# Patient Record
Sex: Male | Born: 1935 | Race: White | Hispanic: No | Marital: Married | Smoking: Never smoker
Health system: Southern US, Community
[De-identification: ages and names within clinical notes are randomized; demographics above are authoritative.]

## PROBLEM LIST (undated history)

## (undated) DIAGNOSIS — C3490 Malignant neoplasm of unspecified part of unspecified bronchus or lung: Secondary | ICD-10-CM

## (undated) DIAGNOSIS — S72142A Displaced intertrochanteric fracture of left femur, initial encounter for closed fracture: Secondary | ICD-10-CM

## (undated) DIAGNOSIS — I10 Essential (primary) hypertension: Secondary | ICD-10-CM

## (undated) DIAGNOSIS — J449 Chronic obstructive pulmonary disease, unspecified: Secondary | ICD-10-CM

## (undated) DIAGNOSIS — E785 Hyperlipidemia, unspecified: Secondary | ICD-10-CM

## (undated) DIAGNOSIS — C801 Malignant (primary) neoplasm, unspecified: Secondary | ICD-10-CM

## (undated) DIAGNOSIS — S4290XA Fracture of unspecified shoulder girdle, part unspecified, initial encounter for closed fracture: Secondary | ICD-10-CM

## (undated) DIAGNOSIS — T7840XA Allergy, unspecified, initial encounter: Secondary | ICD-10-CM

## (undated) HISTORY — PX: HERNIA REPAIR: SHX51

## (undated) HISTORY — DX: Fracture of unspecified shoulder girdle, part unspecified, initial encounter for closed fracture: S42.90XA

## (undated) HISTORY — DX: Essential (primary) hypertension: I10

## (undated) HISTORY — DX: Allergy, unspecified, initial encounter: T78.40XA

## (undated) HISTORY — PX: COLOSTOMY REVISION: SHX5232

## (undated) HISTORY — DX: Hyperlipidemia, unspecified: E78.5

## (undated) HISTORY — PX: COLOSTOMY TAKEDOWN: SHX5258

## (undated) HISTORY — PX: COLON SURGERY: SHX602

---

## 1998-07-30 ENCOUNTER — Ambulatory Visit (HOSPITAL_BASED_OUTPATIENT_CLINIC_OR_DEPARTMENT_OTHER): Admission: RE | Admit: 1998-07-30 | Discharge: 1998-07-30 | Payer: Self-pay | Admitting: *Deleted

## 2000-11-17 ENCOUNTER — Ambulatory Visit (HOSPITAL_COMMUNITY): Admission: RE | Admit: 2000-11-17 | Discharge: 2000-11-17 | Payer: Self-pay | Admitting: Gastroenterology

## 2000-11-17 ENCOUNTER — Encounter (INDEPENDENT_AMBULATORY_CARE_PROVIDER_SITE_OTHER): Payer: Self-pay | Admitting: Specialist

## 2002-11-24 ENCOUNTER — Encounter: Payer: Self-pay | Admitting: Emergency Medicine

## 2002-11-24 ENCOUNTER — Emergency Department (HOSPITAL_COMMUNITY): Admission: EM | Admit: 2002-11-24 | Discharge: 2002-11-25 | Payer: Self-pay | Admitting: Emergency Medicine

## 2002-11-27 ENCOUNTER — Encounter: Admission: RE | Admit: 2002-11-27 | Discharge: 2002-11-27 | Payer: Self-pay | Admitting: Family Medicine

## 2002-11-27 ENCOUNTER — Encounter: Payer: Self-pay | Admitting: Family Medicine

## 2007-07-15 ENCOUNTER — Encounter: Admission: RE | Admit: 2007-07-15 | Discharge: 2007-07-15 | Payer: Self-pay | Admitting: Gastroenterology

## 2007-07-15 ENCOUNTER — Ambulatory Visit: Payer: Self-pay | Admitting: Internal Medicine

## 2007-07-15 ENCOUNTER — Ambulatory Visit: Payer: Self-pay | Admitting: Oncology

## 2007-07-15 ENCOUNTER — Inpatient Hospital Stay (HOSPITAL_COMMUNITY): Admission: EM | Admit: 2007-07-15 | Discharge: 2007-07-22 | Payer: Self-pay | Admitting: Emergency Medicine

## 2007-07-20 ENCOUNTER — Encounter (INDEPENDENT_AMBULATORY_CARE_PROVIDER_SITE_OTHER): Payer: Self-pay | Admitting: Interventional Radiology

## 2007-07-28 ENCOUNTER — Ambulatory Visit: Payer: Self-pay | Admitting: Oncology

## 2007-08-01 ENCOUNTER — Encounter: Admission: RE | Admit: 2007-08-01 | Discharge: 2007-08-01 | Payer: Self-pay | Admitting: Gastroenterology

## 2007-08-01 ENCOUNTER — Ambulatory Visit: Payer: Self-pay | Admitting: Internal Medicine

## 2007-08-01 ENCOUNTER — Inpatient Hospital Stay (HOSPITAL_COMMUNITY): Admission: AD | Admit: 2007-08-01 | Discharge: 2007-08-10 | Payer: Self-pay | Admitting: Gastroenterology

## 2007-08-02 ENCOUNTER — Encounter (INDEPENDENT_AMBULATORY_CARE_PROVIDER_SITE_OTHER): Payer: Self-pay | Admitting: Gastroenterology

## 2007-08-24 ENCOUNTER — Encounter: Payer: Self-pay | Admitting: Emergency Medicine

## 2007-08-24 LAB — COMPREHENSIVE METABOLIC PANEL
AST: 17 U/L (ref 0–37)
Albumin: 3.9 g/dL (ref 3.5–5.2)
Alkaline Phosphatase: 61 U/L (ref 39–117)
Glucose, Bld: 107 mg/dL — ABNORMAL HIGH (ref 70–99)
Potassium: 4.2 mEq/L (ref 3.5–5.3)
Sodium: 141 mEq/L (ref 135–145)
Total Bilirubin: 0.4 mg/dL (ref 0.3–1.2)
Total Protein: 7.1 g/dL (ref 6.0–8.3)

## 2007-08-24 LAB — CBC WITH DIFFERENTIAL/PLATELET
BASO%: 0.3 % (ref 0.0–2.0)
EOS%: 2.9 % (ref 0.0–7.0)
LYMPH%: 24.5 % (ref 14.0–48.0)
MCH: 30.3 pg (ref 28.0–33.4)
MCHC: 34.5 g/dL (ref 32.0–35.9)
MCV: 87.7 fL (ref 81.6–98.0)
MONO%: 6.9 % (ref 0.0–13.0)
NEUT#: 4.5 10*3/uL (ref 1.5–6.5)
Platelets: 283 10*3/uL (ref 145–400)
RBC: 4.57 10*6/uL (ref 4.20–5.71)
RDW: 13.4 % (ref 11.2–14.6)

## 2007-08-30 ENCOUNTER — Ambulatory Visit: Payer: Self-pay | Admitting: Pulmonary Disease

## 2007-08-30 DIAGNOSIS — J449 Chronic obstructive pulmonary disease, unspecified: Secondary | ICD-10-CM

## 2007-08-30 DIAGNOSIS — J4489 Other specified chronic obstructive pulmonary disease: Secondary | ICD-10-CM | POA: Insufficient documentation

## 2007-08-30 DIAGNOSIS — I10 Essential (primary) hypertension: Secondary | ICD-10-CM | POA: Insufficient documentation

## 2007-08-30 DIAGNOSIS — E785 Hyperlipidemia, unspecified: Secondary | ICD-10-CM

## 2007-09-06 ENCOUNTER — Ambulatory Visit (HOSPITAL_COMMUNITY): Admission: RE | Admit: 2007-09-06 | Discharge: 2007-09-06 | Payer: Self-pay | Admitting: Oncology

## 2007-09-15 ENCOUNTER — Ambulatory Visit: Payer: Self-pay | Admitting: Thoracic Surgery

## 2007-09-22 DIAGNOSIS — C3490 Malignant neoplasm of unspecified part of unspecified bronchus or lung: Secondary | ICD-10-CM

## 2007-09-26 ENCOUNTER — Encounter: Payer: Self-pay | Admitting: Emergency Medicine

## 2007-09-26 ENCOUNTER — Encounter: Payer: Self-pay | Admitting: Thoracic Surgery

## 2007-09-26 ENCOUNTER — Ambulatory Visit: Payer: Self-pay | Admitting: Thoracic Surgery

## 2007-09-26 ENCOUNTER — Ambulatory Visit (HOSPITAL_COMMUNITY): Admission: RE | Admit: 2007-09-26 | Discharge: 2007-09-26 | Payer: Self-pay | Admitting: Thoracic Surgery

## 2007-09-28 ENCOUNTER — Encounter: Admission: RE | Admit: 2007-09-28 | Discharge: 2007-09-28 | Payer: Self-pay | Admitting: Thoracic Surgery

## 2007-09-28 ENCOUNTER — Ambulatory Visit: Payer: Self-pay | Admitting: Thoracic Surgery

## 2007-09-30 ENCOUNTER — Ambulatory Visit: Payer: Self-pay | Admitting: Oncology

## 2007-10-10 ENCOUNTER — Inpatient Hospital Stay (HOSPITAL_COMMUNITY): Admission: RE | Admit: 2007-10-10 | Discharge: 2007-10-15 | Payer: Self-pay | Admitting: Thoracic Surgery

## 2007-10-10 ENCOUNTER — Ambulatory Visit: Payer: Self-pay | Admitting: Internal Medicine

## 2007-10-10 ENCOUNTER — Encounter: Payer: Self-pay | Admitting: Thoracic Surgery

## 2007-10-18 ENCOUNTER — Ambulatory Visit: Payer: Self-pay | Admitting: Internal Medicine

## 2007-10-20 ENCOUNTER — Ambulatory Visit: Payer: Self-pay | Admitting: Thoracic Surgery

## 2007-10-20 ENCOUNTER — Encounter: Admission: RE | Admit: 2007-10-20 | Discharge: 2007-10-20 | Payer: Self-pay | Admitting: Thoracic Surgery

## 2007-11-09 ENCOUNTER — Ambulatory Visit: Payer: Self-pay | Admitting: Thoracic Surgery

## 2007-11-09 ENCOUNTER — Encounter: Admission: RE | Admit: 2007-11-09 | Discharge: 2007-11-09 | Payer: Self-pay | Admitting: Thoracic Surgery

## 2007-11-09 LAB — CBC WITH DIFFERENTIAL/PLATELET
Eosinophils Absolute: 0.5 10*3/uL (ref 0.0–0.5)
HCT: 40 % (ref 38.7–49.9)
LYMPH%: 20.2 % (ref 14.0–48.0)
MONO#: 0.6 10*3/uL (ref 0.1–0.9)
NEUT#: 4.7 10*3/uL (ref 1.5–6.5)
NEUT%: 64.1 % (ref 40.0–75.0)
Platelets: 257 10*3/uL (ref 145–400)
WBC: 7.3 10*3/uL (ref 4.0–10.0)

## 2007-11-09 LAB — COMPREHENSIVE METABOLIC PANEL
CO2: 28 mEq/L (ref 19–32)
Calcium: 9.2 mg/dL (ref 8.4–10.5)
Creatinine, Ser: 0.84 mg/dL (ref 0.40–1.50)
Glucose, Bld: 99 mg/dL (ref 70–99)
Sodium: 139 mEq/L (ref 135–145)
Total Bilirubin: 0.3 mg/dL (ref 0.3–1.2)
Total Protein: 6.7 g/dL (ref 6.0–8.3)

## 2007-11-22 LAB — CBC WITH DIFFERENTIAL/PLATELET
Eosinophils Absolute: 0 10*3/uL (ref 0.0–0.5)
LYMPH%: 5.2 % — ABNORMAL LOW (ref 14.0–48.0)
MONO#: 0.3 10*3/uL (ref 0.1–0.9)
NEUT#: 10.4 10*3/uL — ABNORMAL HIGH (ref 1.5–6.5)
Platelets: 276 10*3/uL (ref 145–400)
RBC: 4.6 10*6/uL (ref 4.20–5.71)
WBC: 11.3 10*3/uL — ABNORMAL HIGH (ref 4.0–10.0)
lymph#: 0.6 10*3/uL — ABNORMAL LOW (ref 0.9–3.3)

## 2007-11-22 LAB — COMPREHENSIVE METABOLIC PANEL
Albumin: 4.1 g/dL (ref 3.5–5.2)
CO2: 23 mEq/L (ref 19–32)
Calcium: 9.7 mg/dL (ref 8.4–10.5)
Chloride: 102 mEq/L (ref 96–112)
Glucose, Bld: 172 mg/dL — ABNORMAL HIGH (ref 70–99)
Potassium: 4.2 mEq/L (ref 3.5–5.3)
Sodium: 139 mEq/L (ref 135–145)
Total Bilirubin: 0.3 mg/dL (ref 0.3–1.2)
Total Protein: 7.1 g/dL (ref 6.0–8.3)

## 2007-11-25 ENCOUNTER — Ambulatory Visit: Payer: Self-pay | Admitting: Internal Medicine

## 2007-11-29 LAB — CBC WITH DIFFERENTIAL/PLATELET
BASO%: 0.3 % (ref 0.0–2.0)
Eosinophils Absolute: 0.1 10*3/uL (ref 0.0–0.5)
MCHC: 35 g/dL (ref 32.0–35.9)
MONO#: 0.6 10*3/uL (ref 0.1–0.9)
NEUT#: 1.2 10*3/uL — ABNORMAL LOW (ref 1.5–6.5)
RBC: 4.67 10*6/uL (ref 4.20–5.71)
RDW: 13.8 % (ref 11.2–14.6)
WBC: 3.2 10*3/uL — ABNORMAL LOW (ref 4.0–10.0)

## 2007-11-29 LAB — COMPREHENSIVE METABOLIC PANEL
ALT: 18 U/L (ref 0–53)
Albumin: 4 g/dL (ref 3.5–5.2)
Alkaline Phosphatase: 66 U/L (ref 39–117)
CO2: 29 mEq/L (ref 19–32)
Glucose, Bld: 99 mg/dL (ref 70–99)
Potassium: 4.6 mEq/L (ref 3.5–5.3)
Sodium: 137 mEq/L (ref 135–145)
Total Protein: 6.7 g/dL (ref 6.0–8.3)

## 2007-12-08 LAB — CBC WITH DIFFERENTIAL/PLATELET
BASO%: 0.3 % (ref 0.0–2.0)
Eosinophils Absolute: 0 10*3/uL (ref 0.0–0.5)
HCT: 38.3 % — ABNORMAL LOW (ref 38.7–49.9)
MCHC: 34.5 g/dL (ref 32.0–35.9)
MONO#: 0.4 10*3/uL (ref 0.1–0.9)
NEUT#: 5 10*3/uL (ref 1.5–6.5)
NEUT%: 72.9 % (ref 40.0–75.0)
RBC: 4.4 10*6/uL (ref 4.20–5.71)
WBC: 6.9 10*3/uL (ref 4.0–10.0)
lymph#: 1.5 10*3/uL (ref 0.9–3.3)

## 2007-12-08 LAB — COMPREHENSIVE METABOLIC PANEL
ALT: 17 U/L (ref 0–53)
CO2: 32 mEq/L (ref 19–32)
Calcium: 9.3 mg/dL (ref 8.4–10.5)
Chloride: 100 mEq/L (ref 96–112)
Glucose, Bld: 81 mg/dL (ref 70–99)
Sodium: 140 mEq/L (ref 135–145)
Total Protein: 6.3 g/dL (ref 6.0–8.3)

## 2007-12-12 LAB — COMPREHENSIVE METABOLIC PANEL
ALT: 12 U/L (ref 0–53)
CO2: 28 mEq/L (ref 19–32)
Chloride: 100 mEq/L (ref 96–112)
Potassium: 4.4 mEq/L (ref 3.5–5.3)
Sodium: 137 mEq/L (ref 135–145)
Total Bilirubin: 0.3 mg/dL (ref 0.3–1.2)
Total Protein: 6.4 g/dL (ref 6.0–8.3)

## 2007-12-12 LAB — CBC WITH DIFFERENTIAL/PLATELET
BASO%: 0.1 % (ref 0.0–2.0)
LYMPH%: 5.7 % — ABNORMAL LOW (ref 14.0–48.0)
MCHC: 34.7 g/dL (ref 32.0–35.9)
MONO#: 0.1 10*3/uL (ref 0.1–0.9)
RBC: 4.64 10*6/uL (ref 4.20–5.71)
RDW: 13.2 % (ref 11.2–14.6)
WBC: 14.6 10*3/uL — ABNORMAL HIGH (ref 4.0–10.0)
lymph#: 0.8 10*3/uL — ABNORMAL LOW (ref 0.9–3.3)

## 2007-12-14 ENCOUNTER — Ambulatory Visit: Payer: Self-pay | Admitting: Thoracic Surgery

## 2007-12-14 ENCOUNTER — Encounter: Admission: RE | Admit: 2007-12-14 | Discharge: 2007-12-14 | Payer: Self-pay | Admitting: Thoracic Surgery

## 2007-12-20 LAB — COMPREHENSIVE METABOLIC PANEL
ALT: 18 U/L (ref 0–53)
AST: 16 U/L (ref 0–37)
Alkaline Phosphatase: 64 U/L (ref 39–117)
CO2: 31 mEq/L (ref 19–32)
Sodium: 138 mEq/L (ref 135–145)
Total Bilirubin: 0.4 mg/dL (ref 0.3–1.2)
Total Protein: 6.1 g/dL (ref 6.0–8.3)

## 2007-12-20 LAB — CBC WITH DIFFERENTIAL/PLATELET
BASO%: 0.8 % (ref 0.0–2.0)
LYMPH%: 25.1 % (ref 14.0–48.0)
MCHC: 35.3 g/dL (ref 32.0–35.9)
MONO#: 0.7 10*3/uL (ref 0.1–0.9)
MONO%: 17 % — ABNORMAL HIGH (ref 0.0–13.0)
Platelets: 163 10*3/uL (ref 145–400)
RBC: 4.32 10*6/uL (ref 4.20–5.71)
WBC: 4.2 10*3/uL (ref 4.0–10.0)

## 2007-12-27 LAB — CBC WITH DIFFERENTIAL/PLATELET
BASO%: 0.2 % (ref 0.0–2.0)
LYMPH%: 14.2 % (ref 14.0–48.0)
MCH: 30.2 pg (ref 28.0–33.4)
MCHC: 34.8 g/dL (ref 32.0–35.9)
MCV: 86.8 fL (ref 81.6–98.0)
MONO%: 4.5 % (ref 0.0–13.0)
Platelets: 103 10*3/uL — ABNORMAL LOW (ref 145–400)
RBC: 4.39 10*6/uL (ref 4.20–5.71)

## 2007-12-27 LAB — COMPREHENSIVE METABOLIC PANEL
ALT: 19 U/L (ref 0–53)
Alkaline Phosphatase: 69 U/L (ref 39–117)
Glucose, Bld: 105 mg/dL — ABNORMAL HIGH (ref 70–99)
Sodium: 141 mEq/L (ref 135–145)
Total Bilirubin: 0.4 mg/dL (ref 0.3–1.2)
Total Protein: 6.4 g/dL (ref 6.0–8.3)

## 2008-01-02 LAB — COMPREHENSIVE METABOLIC PANEL
Alkaline Phosphatase: 53 U/L (ref 39–117)
BUN: 28 mg/dL — ABNORMAL HIGH (ref 6–23)
Creatinine, Ser: 0.93 mg/dL (ref 0.40–1.50)
Glucose, Bld: 148 mg/dL — ABNORMAL HIGH (ref 70–99)
Sodium: 142 mEq/L (ref 135–145)
Total Bilirubin: 0.4 mg/dL (ref 0.3–1.2)
Total Protein: 6.1 g/dL (ref 6.0–8.3)

## 2008-01-02 LAB — CBC WITH DIFFERENTIAL/PLATELET
Eosinophils Absolute: 0 10*3/uL (ref 0.0–0.5)
HCT: 33 % — ABNORMAL LOW (ref 38.7–49.9)
LYMPH%: 4.7 % — ABNORMAL LOW (ref 14.0–48.0)
MCV: 86.7 fL (ref 81.6–98.0)
MONO%: 2.5 % (ref 0.0–13.0)
NEUT#: 11.6 10*3/uL — ABNORMAL HIGH (ref 1.5–6.5)
NEUT%: 92.8 % — ABNORMAL HIGH (ref 40.0–75.0)
Platelets: 168 10*3/uL (ref 145–400)
RBC: 3.81 10*6/uL — ABNORMAL LOW (ref 4.20–5.71)

## 2008-01-10 ENCOUNTER — Ambulatory Visit: Payer: Self-pay | Admitting: Internal Medicine

## 2008-01-10 LAB — CBC WITH DIFFERENTIAL/PLATELET
BASO%: 0.5 % (ref 0.0–2.0)
Basophils Absolute: 0 10*3/uL (ref 0.0–0.1)
EOS%: 0.3 % (ref 0.0–7.0)
HCT: 31.1 % — ABNORMAL LOW (ref 38.7–49.9)
HGB: 10.9 g/dL — ABNORMAL LOW (ref 13.0–17.1)
LYMPH%: 22 % (ref 14.0–48.0)
MCH: 30.6 pg (ref 28.0–33.4)
MCHC: 35.2 g/dL (ref 32.0–35.9)
MCV: 87.1 fL (ref 81.6–98.0)
NEUT%: 55.7 % (ref 40.0–75.0)
Platelets: 148 10*3/uL (ref 145–400)

## 2008-01-10 LAB — COMPREHENSIVE METABOLIC PANEL
ALT: 13 U/L (ref 0–53)
AST: 17 U/L (ref 0–37)
BUN: 34 mg/dL — ABNORMAL HIGH (ref 6–23)
Creatinine, Ser: 0.95 mg/dL (ref 0.40–1.50)
Total Bilirubin: 0.5 mg/dL (ref 0.3–1.2)

## 2008-01-17 LAB — COMPREHENSIVE METABOLIC PANEL
ALT: 10 U/L (ref 0–53)
BUN: 16 mg/dL (ref 6–23)
CO2: 28 mEq/L (ref 19–32)
Calcium: 8.7 mg/dL (ref 8.4–10.5)
Chloride: 105 mEq/L (ref 96–112)
Creatinine, Ser: 0.88 mg/dL (ref 0.40–1.50)
Glucose, Bld: 106 mg/dL — ABNORMAL HIGH (ref 70–99)
Total Bilirubin: 0.4 mg/dL (ref 0.3–1.2)

## 2008-01-17 LAB — CBC WITH DIFFERENTIAL/PLATELET
BASO%: 0.3 % (ref 0.0–2.0)
Basophils Absolute: 0 10*3/uL (ref 0.0–0.1)
HCT: 29.7 % — ABNORMAL LOW (ref 38.7–49.9)
HGB: 10.5 g/dL — ABNORMAL LOW (ref 13.0–17.1)
LYMPH%: 14.8 % (ref 14.0–48.0)
MCH: 31.1 pg (ref 28.0–33.4)
MCHC: 35.3 g/dL (ref 32.0–35.9)
MONO#: 0.3 10*3/uL (ref 0.1–0.9)
NEUT%: 80.6 % — ABNORMAL HIGH (ref 40.0–75.0)
Platelets: 84 10*3/uL — ABNORMAL LOW (ref 145–400)
WBC: 6.5 10*3/uL (ref 4.0–10.0)
lymph#: 1 10*3/uL (ref 0.9–3.3)

## 2008-01-19 ENCOUNTER — Ambulatory Visit (HOSPITAL_COMMUNITY): Admission: RE | Admit: 2008-01-19 | Discharge: 2008-01-19 | Payer: Self-pay | Admitting: Internal Medicine

## 2008-01-23 LAB — COMPREHENSIVE METABOLIC PANEL
ALT: 8 U/L (ref 0–53)
CO2: 26 mEq/L (ref 19–32)
Calcium: 9.2 mg/dL (ref 8.4–10.5)
Chloride: 103 mEq/L (ref 96–112)
Creatinine, Ser: 0.81 mg/dL (ref 0.40–1.50)
Glucose, Bld: 209 mg/dL — ABNORMAL HIGH (ref 70–99)
Total Bilirubin: 0.4 mg/dL (ref 0.3–1.2)

## 2008-01-23 LAB — CBC WITH DIFFERENTIAL/PLATELET
BASO%: 0 % (ref 0.0–2.0)
Basophils Absolute: 0 10*3/uL (ref 0.0–0.1)
Eosinophils Absolute: 0 10*3/uL (ref 0.0–0.5)
HCT: 31.7 % — ABNORMAL LOW (ref 38.7–49.9)
HGB: 10.9 g/dL — ABNORMAL LOW (ref 13.0–17.1)
LYMPH%: 5.4 % — ABNORMAL LOW (ref 14.0–48.0)
MCHC: 34.5 g/dL (ref 32.0–35.9)
MONO#: 0.1 10*3/uL (ref 0.1–0.9)
NEUT#: 7.6 10*3/uL — ABNORMAL HIGH (ref 1.5–6.5)
NEUT%: 93.7 % — ABNORMAL HIGH (ref 40.0–75.0)
Platelets: 241 10*3/uL (ref 145–400)
WBC: 8.1 10*3/uL (ref 4.0–10.0)
lymph#: 0.4 10*3/uL — ABNORMAL LOW (ref 0.9–3.3)

## 2008-01-31 ENCOUNTER — Encounter: Admission: RE | Admit: 2008-01-31 | Discharge: 2008-01-31 | Payer: Self-pay | Admitting: Thoracic Surgery

## 2008-01-31 ENCOUNTER — Ambulatory Visit: Payer: Self-pay | Admitting: Thoracic Surgery

## 2008-02-08 LAB — COMPREHENSIVE METABOLIC PANEL
ALT: 9 U/L (ref 0–53)
AST: 16 U/L (ref 0–37)
Calcium: 8.5 mg/dL (ref 8.4–10.5)
Chloride: 102 mEq/L (ref 96–112)
Creatinine, Ser: 0.71 mg/dL (ref 0.40–1.50)
Potassium: 3.8 mEq/L (ref 3.5–5.3)

## 2008-02-08 LAB — CBC WITH DIFFERENTIAL/PLATELET
BASO%: 0.3 % (ref 0.0–2.0)
EOS%: 0 % (ref 0.0–7.0)
MCH: 32.4 pg (ref 28.0–33.4)
MCHC: 34.9 g/dL (ref 32.0–35.9)
RDW: 21.4 % — ABNORMAL HIGH (ref 11.2–14.6)
lymph#: 0.8 10*3/uL — ABNORMAL LOW (ref 0.9–3.3)

## 2008-02-15 LAB — CBC WITH DIFFERENTIAL/PLATELET
BASO%: 0.6 % (ref 0.0–2.0)
EOS%: 0.1 % (ref 0.0–7.0)
HCT: 28.3 % — ABNORMAL LOW (ref 38.7–49.9)
LYMPH%: 20 % (ref 14.0–48.0)
MCH: 32.6 pg (ref 28.0–33.4)
MCHC: 34.4 g/dL (ref 32.0–35.9)
MCV: 94.8 fL (ref 81.6–98.0)
MONO%: 10.4 % (ref 0.0–13.0)
NEUT%: 68.9 % (ref 40.0–75.0)
lymph#: 1 10*3/uL (ref 0.9–3.3)

## 2008-02-15 LAB — COMPREHENSIVE METABOLIC PANEL
ALT: 10 U/L (ref 0–53)
AST: 16 U/L (ref 0–37)
Alkaline Phosphatase: 60 U/L (ref 39–117)
Chloride: 103 mEq/L (ref 96–112)
Creatinine, Ser: 0.72 mg/dL (ref 0.40–1.50)
Total Bilirubin: 0.4 mg/dL (ref 0.3–1.2)

## 2008-04-13 IMAGING — PT NM PET TUM IMG SKULL BASE T - THIGH
1 of 5 series · 1 of 25 positions shown · non-contrast
Comparison: None.

FDG PET-CT TUMOR IMAGING (SKULL BASE TO THIGHS):

CLINICAL DATA: Left lower lobe pulmonary carcinoma
TECHNIQUE: 18.4 mCi F-18 FDG was injected intravenously via the right
antecubital fossa .  Full-ring PET imaging was performed from the skull base
through the mid-thighs 60 minutes after injection.  CT data was obtained and
used for attenuation correction and anatomic localization only.  (This was not
acquired as a diagnostic CT examination.)

[Series 1: pet ac · axial · 3.3mm · 4.69mm/px · 1 of 267 slices shown]
[im 160/267]
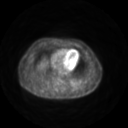

[1 of 25 positions shown; findings below may reference images not displayed]

FINDINGS: The pulmonary lesion in the superior segment of the left lower lobe is
hypermetabolic with SUV max of 14.7. The patient has hypermetabolic activity in
the precarinal station, subcarinal sedation, and both hila. Small lymph nodes
are seen in these locations on the diagnostic CT chest from 08/10/2007.

There is some low attenuation and thickening in the adrenal glands bilaterally.
The left adrenal gland averages about 2 Hounsfield units on the CT scan. There
is no evidence for FDG accumulation within either adrenal gland above background
soft tissue levels. Given the lack of FDG uptake, the low attenuation, and the
diffuse thickening without a discrete nodule, the changes in the adrenal glands
are felt to be most likely related to hyperplasia.

No unexpected areas of hypermetabolic activity are seen in the neck, abdomen, or
pelvis.

The heart is enlarged with coronary artery and mitral annular calcification.
Atherosclerotic calcification is seen in the abdominal aorta without aneurysm. A
9 mm hyper attenuating lesion in the right kidney demonstrates no FDG
accumulation on the PET scan. This may be below the size threshold for pet
resolution. While likely a cyst complicated by proteinaceous debris or
hemorrhage, continued attention on followup imaging is recommended. A 2.6 cm
exophytic low density lesion in the left kidney is not hypermetabolic. Left
lower quadrant colostomy is noted.
IMPRESSION: Malignant range hypermetabolic uptake within the left lower lobe pulmonary
lesion. Hypermetabolic activity within mediastinal and bilateral hilar lymph
nodes ranges with SUV values from 5.3 up to 8.6.

## 2008-04-18 ENCOUNTER — Ambulatory Visit: Payer: Self-pay | Admitting: Internal Medicine

## 2008-04-18 ENCOUNTER — Ambulatory Visit (HOSPITAL_COMMUNITY): Admission: RE | Admit: 2008-04-18 | Discharge: 2008-04-18 | Payer: Self-pay | Admitting: Internal Medicine

## 2008-05-07 ENCOUNTER — Encounter: Admission: RE | Admit: 2008-05-07 | Discharge: 2008-05-07 | Payer: Self-pay | Admitting: General Surgery

## 2008-05-08 ENCOUNTER — Ambulatory Visit: Payer: Self-pay | Admitting: Thoracic Surgery

## 2008-07-17 ENCOUNTER — Ambulatory Visit: Payer: Self-pay | Admitting: Internal Medicine

## 2008-07-17 ENCOUNTER — Encounter (INDEPENDENT_AMBULATORY_CARE_PROVIDER_SITE_OTHER): Payer: Self-pay | Admitting: General Surgery

## 2008-07-17 ENCOUNTER — Inpatient Hospital Stay (HOSPITAL_COMMUNITY): Admission: RE | Admit: 2008-07-17 | Discharge: 2008-07-22 | Payer: Self-pay | Admitting: General Surgery

## 2008-08-08 ENCOUNTER — Ambulatory Visit (HOSPITAL_COMMUNITY): Admission: RE | Admit: 2008-08-08 | Discharge: 2008-08-08 | Payer: Self-pay | Admitting: Internal Medicine

## 2008-08-08 LAB — CBC WITH DIFFERENTIAL/PLATELET
BASO%: 0.3 % (ref 0.0–2.0)
Basophils Absolute: 0 10*3/uL (ref 0.0–0.1)
EOS%: 5 % (ref 0.0–7.0)
HGB: 13.1 g/dL (ref 13.0–17.1)
MCH: 29 pg (ref 28.0–33.4)
MCHC: 33.5 g/dL (ref 32.0–35.9)
RDW: 16.1 % — ABNORMAL HIGH (ref 11.2–14.6)
WBC: 5.4 10*3/uL (ref 4.0–10.0)
lymph#: 1.6 10*3/uL (ref 0.9–3.3)

## 2008-08-08 LAB — COMPREHENSIVE METABOLIC PANEL
ALT: 12 U/L (ref 0–53)
AST: 15 U/L (ref 0–37)
Albumin: 3.7 g/dL (ref 3.5–5.2)
Calcium: 9.6 mg/dL (ref 8.4–10.5)
Chloride: 100 mEq/L (ref 96–112)
Potassium: 4.1 mEq/L (ref 3.5–5.3)

## 2008-08-14 ENCOUNTER — Ambulatory Visit: Payer: Self-pay | Admitting: Thoracic Surgery

## 2008-11-24 IMAGING — CT CT CHEST W/ CM
2 of 4 series · 15 of 36 positions shown, 18 images · IV contrast (agent unspecified)
Comparison: Chest CT 01/19/2008.

CLINICAL DATA: Lung cancer.  Chemotherapy completed.

CT CHEST WITH CONTRAST
TECHNIQUE: Multidetector CT imaging of the chest was performed
following the standard protocol during bolus administration of
intravenous contrast.
Contrast: 80 ml [id number].

[Series 2: chest routine 5.0 b40f · axial · 0.76mm/px · z∈[-224,+46]mm · 12 of 64 slices shown, 15 images]
[im 5/64  mediastinal]
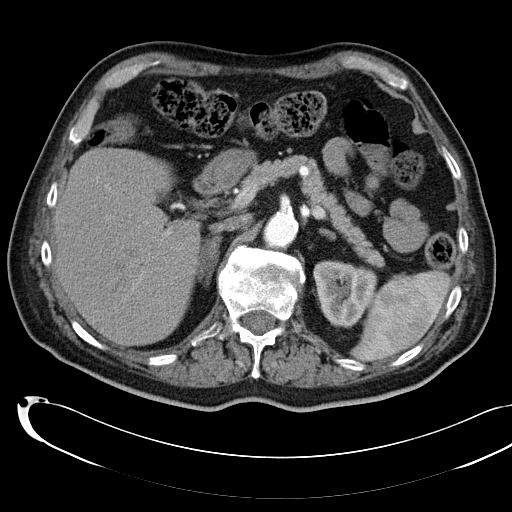
[im 5/64  lung]
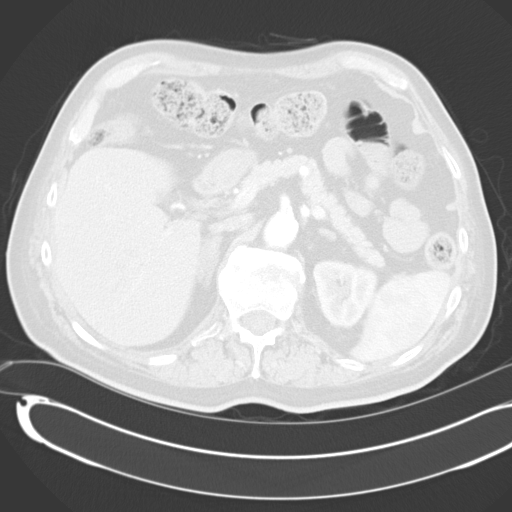
[im 10/64  lung]
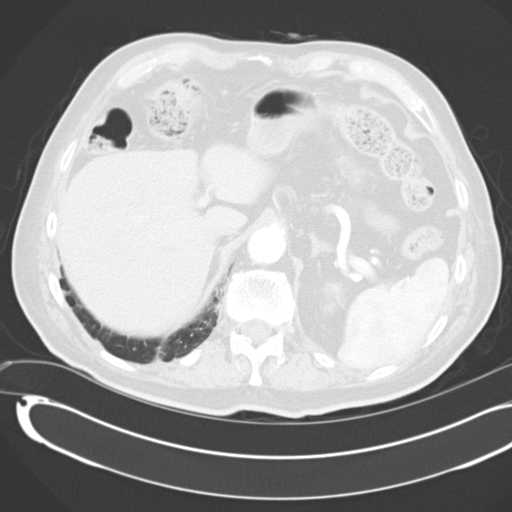
[im 15/64  lung]
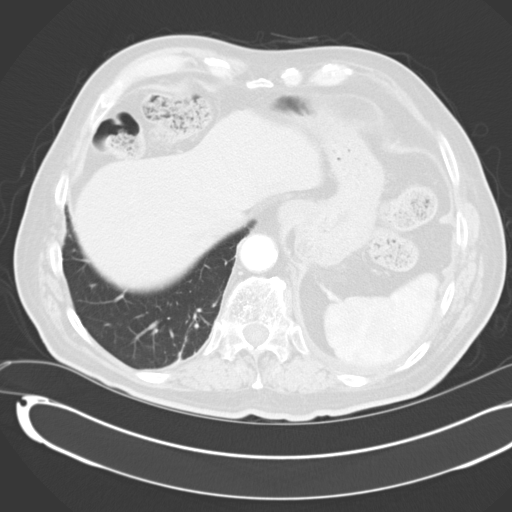
[im 20/64  lung]
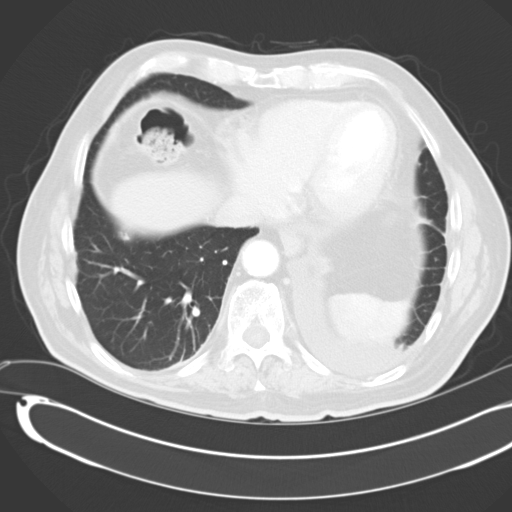
[im 25/64  mediastinal]
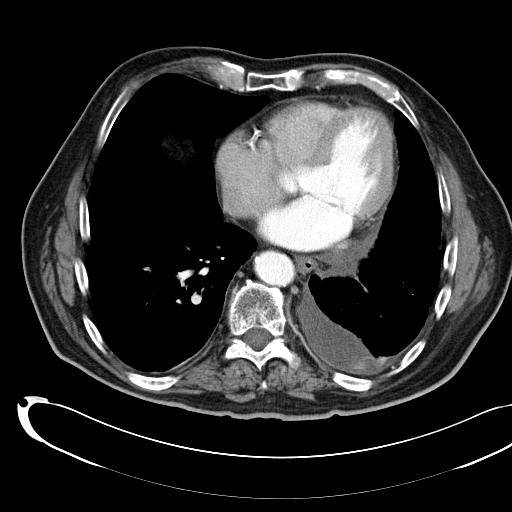
[im 25/64  lung]
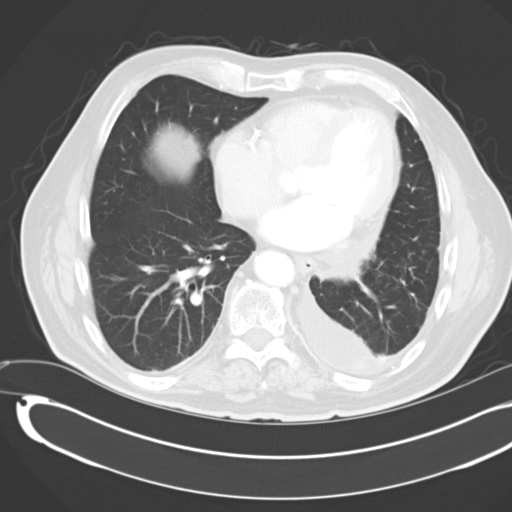
[im 30/64  lung]
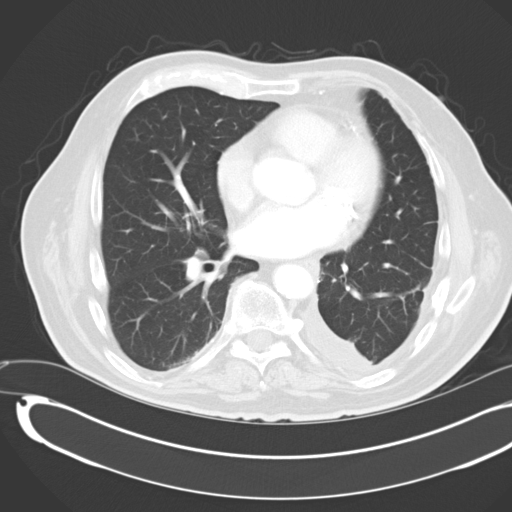
[im 34/64  lung]
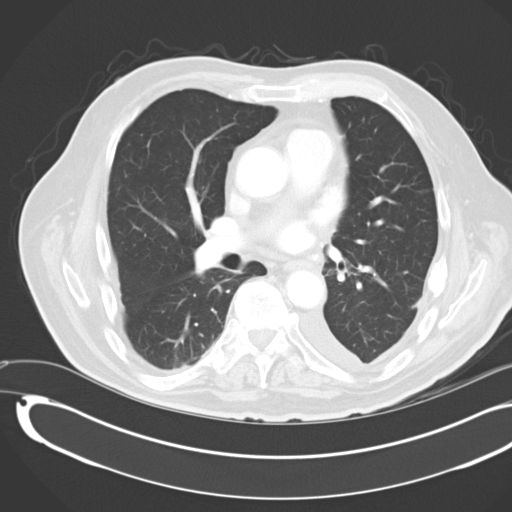
[im 39/64  lung]
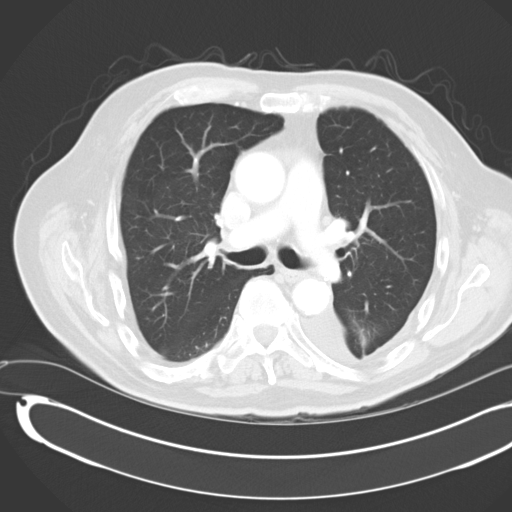
[im 44/64  mediastinal]
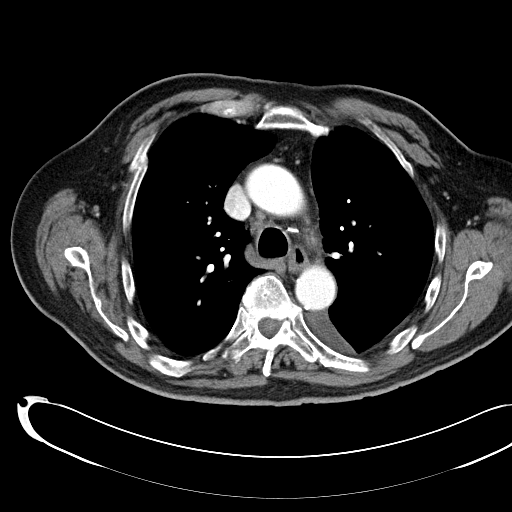
[im 44/64  lung]
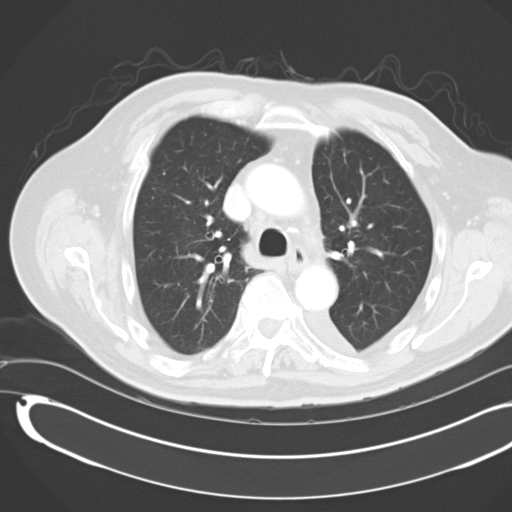
[im 49/64  lung]
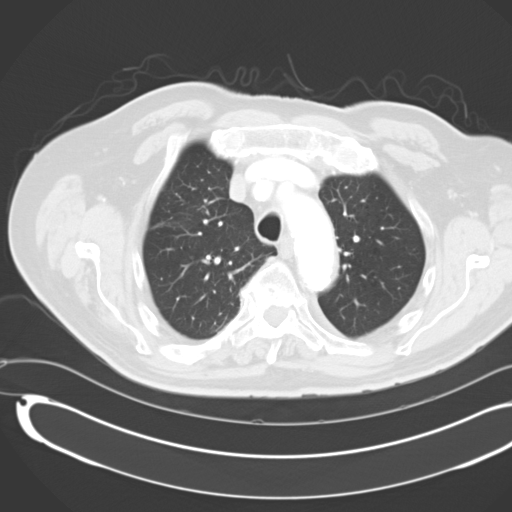
[im 54/64  lung]
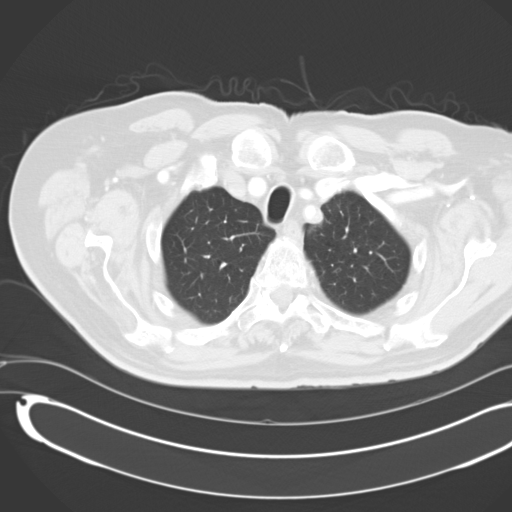
[im 59/64  lung]
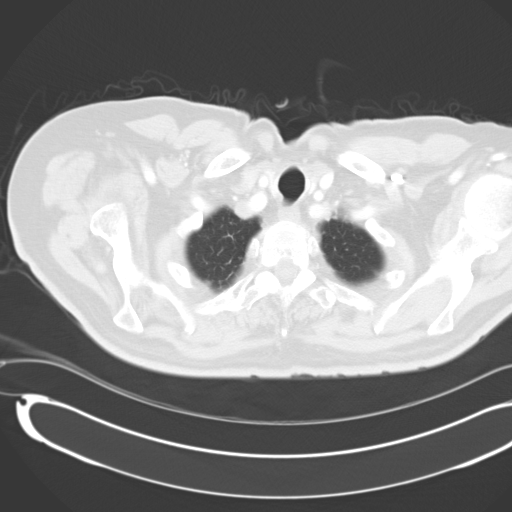

[Series 602: coronal images · coronal · 0.76mm/px · 3 of 94 slices shown]
[im 19/94  lung]
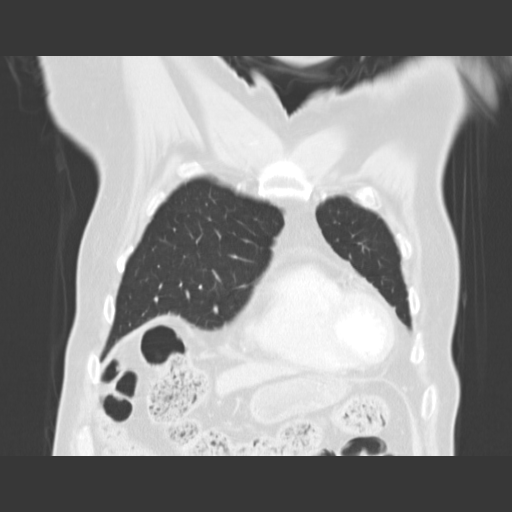
[im 38/94  lung]
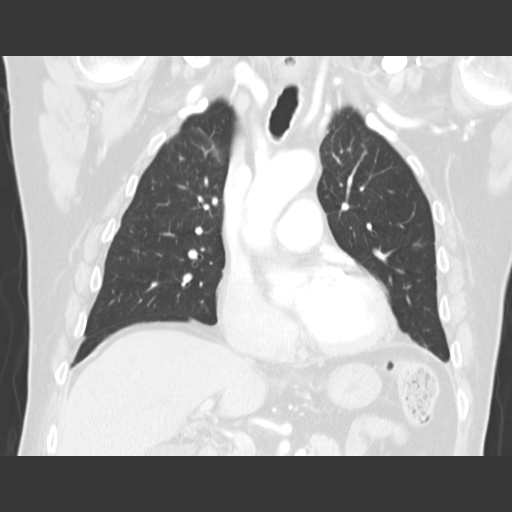
[im 56/94  lung]
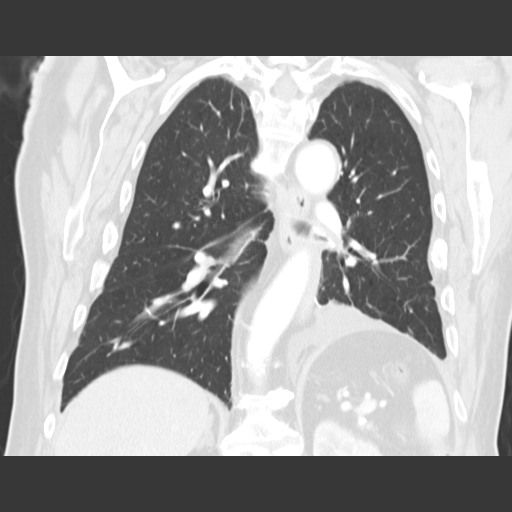

[15 of 36 positions shown; findings below may reference images not displayed]

FINDINGS: There are stable postsurgical changes status post left
thoracotomy and lower lobe resection.  Scattered mild pulmonary
scarring appears stable.  There are no suspicious pulmonary nodules
or endobronchial lesions.

The patient has developed a small dependent left pleural effusion
superimposed on pleural thickening.  Within the left pleural space,
there is increased density on images 39-42.  This appears linear on
the reformatted images and is probably related to visceral pleural
thickening.  No well-defined pleural-based mass is identified.
There is no right pleural effusion or pericardial effusion.

There are no enlarged mediastinal or hilar lymph nodes.  Coronary
artery disease and atherosclerosis at the origin of the left
subclavian artery are again noted.

The visualized upper abdomen has a stable appearance with
prominence of both adrenal glands and a stable low density lesion
in the left hepatic lobe on image 52.  A small left renal cyst is
partially imaged and appears stable.  There are no suspicious
osseous lesions.
IMPRESSION: 1.  Interval development of small left pleural effusion with
probable associated pleural thickening.  No well-defined pleural-
based mass is demonstrated.  Close CT follow-up or thoracentesis is
recommended to exclude recurrence in the pleural space.
2.  Otherwise stable postoperative CT of the chest with mild
parenchymal scarring.  No adenopathy or local recurrence
demonstrated.
3.  Stable atherosclerosis.
4.  Stable appearance of the visualized upper abdomen.

## 2009-01-29 ENCOUNTER — Ambulatory Visit: Payer: Self-pay | Admitting: Internal Medicine

## 2009-01-31 ENCOUNTER — Ambulatory Visit (HOSPITAL_COMMUNITY): Admission: RE | Admit: 2009-01-31 | Discharge: 2009-01-31 | Payer: Self-pay | Admitting: Internal Medicine

## 2009-01-31 LAB — CBC WITH DIFFERENTIAL/PLATELET
Basophils Absolute: 0 10*3/uL (ref 0.0–0.1)
EOS%: 1.4 % (ref 0.0–7.0)
Eosinophils Absolute: 0.1 10*3/uL (ref 0.0–0.5)
HCT: 41.2 % (ref 38.4–49.9)
HGB: 13.9 g/dL (ref 13.0–17.1)
MONO#: 0.4 10*3/uL (ref 0.1–0.9)
NEUT#: 3.2 10*3/uL (ref 1.5–6.5)
RDW: 14.9 % — ABNORMAL HIGH (ref 11.0–14.6)
WBC: 4.9 10*3/uL (ref 4.0–10.3)
lymph#: 1.2 10*3/uL (ref 0.9–3.3)

## 2009-01-31 LAB — COMPREHENSIVE METABOLIC PANEL
AST: 15 U/L (ref 0–37)
Albumin: 3.7 g/dL (ref 3.5–5.2)
BUN: 12 mg/dL (ref 6–23)
CO2: 31 mEq/L (ref 19–32)
Calcium: 9.2 mg/dL (ref 8.4–10.5)
Chloride: 101 mEq/L (ref 96–112)
Potassium: 4.2 mEq/L (ref 3.5–5.3)

## 2009-02-05 ENCOUNTER — Ambulatory Visit: Payer: Self-pay | Admitting: Thoracic Surgery

## 2009-06-04 ENCOUNTER — Encounter: Admission: RE | Admit: 2009-06-04 | Discharge: 2009-06-04 | Payer: Self-pay | Admitting: Family Medicine

## 2009-07-12 ENCOUNTER — Ambulatory Visit: Payer: Self-pay | Admitting: Internal Medicine

## 2009-07-16 ENCOUNTER — Ambulatory Visit (HOSPITAL_COMMUNITY): Admission: RE | Admit: 2009-07-16 | Discharge: 2009-07-16 | Payer: Self-pay | Admitting: Internal Medicine

## 2009-07-16 LAB — COMPREHENSIVE METABOLIC PANEL
ALT: 10 U/L (ref 0–53)
Albumin: 4.1 g/dL (ref 3.5–5.2)
CO2: 34 mEq/L — ABNORMAL HIGH (ref 19–32)
Glucose, Bld: 96 mg/dL (ref 70–99)
Potassium: 4.1 mEq/L (ref 3.5–5.3)
Sodium: 136 mEq/L (ref 135–145)
Total Protein: 6.7 g/dL (ref 6.0–8.3)

## 2009-07-16 LAB — CBC WITH DIFFERENTIAL/PLATELET
Eosinophils Absolute: 0.1 10*3/uL (ref 0.0–0.5)
MONO#: 0.5 10*3/uL (ref 0.1–0.9)
NEUT#: 4.1 10*3/uL (ref 1.5–6.5)
RBC: 4.81 10*6/uL (ref 4.20–5.82)
RDW: 13.5 % (ref 11.0–14.6)
WBC: 6.3 10*3/uL (ref 4.0–10.3)
lymph#: 1.6 10*3/uL (ref 0.9–3.3)

## 2009-08-14 ENCOUNTER — Ambulatory Visit: Payer: Self-pay | Admitting: Thoracic Surgery

## 2009-08-19 ENCOUNTER — Ambulatory Visit (HOSPITAL_COMMUNITY): Admission: RE | Admit: 2009-08-19 | Discharge: 2009-08-19 | Payer: Self-pay | Admitting: Thoracic Surgery

## 2009-08-19 ENCOUNTER — Ambulatory Visit: Payer: Self-pay | Admitting: Thoracic Surgery

## 2009-08-21 ENCOUNTER — Ambulatory Visit: Payer: Self-pay | Admitting: Thoracic Surgery

## 2010-01-21 ENCOUNTER — Ambulatory Visit: Payer: Self-pay | Admitting: Internal Medicine

## 2010-01-22 LAB — CBC WITH DIFFERENTIAL/PLATELET
BASO%: 0.2 % (ref 0.0–2.0)
HCT: 41.1 % (ref 38.4–49.9)
MCHC: 34.4 g/dL (ref 32.0–36.0)
MONO#: 0.4 10*3/uL (ref 0.1–0.9)
NEUT#: 4.4 10*3/uL (ref 1.5–6.5)
RBC: 4.63 10*6/uL (ref 4.20–5.82)
WBC: 6.4 10*3/uL (ref 4.0–10.3)
lymph#: 1.5 10*3/uL (ref 0.9–3.3)

## 2010-01-22 LAB — COMPREHENSIVE METABOLIC PANEL
ALT: 11 U/L (ref 0–53)
Albumin: 3.9 g/dL (ref 3.5–5.2)
CO2: 33 mEq/L — ABNORMAL HIGH (ref 19–32)
Calcium: 9 mg/dL (ref 8.4–10.5)
Chloride: 100 mEq/L (ref 96–112)
Sodium: 140 mEq/L (ref 135–145)
Total Protein: 6.7 g/dL (ref 6.0–8.3)

## 2010-01-23 ENCOUNTER — Ambulatory Visit (HOSPITAL_COMMUNITY): Admission: RE | Admit: 2010-01-23 | Discharge: 2010-01-23 | Payer: Self-pay | Admitting: Internal Medicine

## 2010-05-14 ENCOUNTER — Encounter: Admission: RE | Admit: 2010-05-14 | Discharge: 2010-05-14 | Payer: Self-pay | Admitting: Family Medicine

## 2010-07-23 ENCOUNTER — Ambulatory Visit: Payer: Self-pay | Admitting: Internal Medicine

## 2010-07-25 LAB — CBC WITH DIFFERENTIAL/PLATELET
Basophils Absolute: 0 10*3/uL (ref 0.0–0.1)
EOS%: 1.7 % (ref 0.0–7.0)
Eosinophils Absolute: 0.1 10*3/uL (ref 0.0–0.5)
MCHC: 34 g/dL (ref 32.0–36.0)
MONO%: 8.5 % (ref 0.0–14.0)
NEUT%: 65.4 % (ref 39.0–75.0)
RBC: 4.71 10*6/uL (ref 4.20–5.82)
RDW: 13.8 % (ref 11.0–14.6)
WBC: 6.3 10*3/uL (ref 4.0–10.3)
lymph#: 1.5 10*3/uL (ref 0.9–3.3)

## 2010-07-25 LAB — COMPREHENSIVE METABOLIC PANEL
AST: 15 U/L (ref 0–37)
Albumin: 4.3 g/dL (ref 3.5–5.2)
Alkaline Phosphatase: 60 U/L (ref 39–117)
BUN: 16 mg/dL (ref 6–23)
Calcium: 9.3 mg/dL (ref 8.4–10.5)
Creatinine, Ser: 1.02 mg/dL (ref 0.40–1.50)
Glucose, Bld: 102 mg/dL — ABNORMAL HIGH (ref 70–99)
Potassium: 4.4 mEq/L (ref 3.5–5.3)

## 2010-07-28 ENCOUNTER — Ambulatory Visit (HOSPITAL_COMMUNITY)
Admission: RE | Admit: 2010-07-28 | Discharge: 2010-07-28 | Payer: Self-pay | Source: Home / Self Care | Admitting: Internal Medicine

## 2010-07-30 ENCOUNTER — Encounter: Payer: Self-pay | Admitting: Emergency Medicine

## 2010-09-27 ENCOUNTER — Other Ambulatory Visit: Payer: Self-pay | Admitting: Internal Medicine

## 2010-09-27 DIAGNOSIS — C3490 Malignant neoplasm of unspecified part of unspecified bronchus or lung: Secondary | ICD-10-CM

## 2010-09-28 ENCOUNTER — Encounter: Payer: Self-pay | Admitting: Internal Medicine

## 2010-09-28 ENCOUNTER — Encounter: Payer: Self-pay | Admitting: Family Medicine

## 2010-10-07 NOTE — Letter (Signed)
Summary: [facility]  [facility]   Imported By: Sherian Rein 08/07/2010 14:01:36  _____________________________________________________________________  External Attachment:    Type:   Image     Comment:   External Document

## 2010-11-06 ENCOUNTER — Other Ambulatory Visit: Payer: Self-pay | Admitting: Family Medicine

## 2010-11-06 DIAGNOSIS — I6529 Occlusion and stenosis of unspecified carotid artery: Secondary | ICD-10-CM

## 2010-11-10 ENCOUNTER — Ambulatory Visit
Admission: RE | Admit: 2010-11-10 | Discharge: 2010-11-10 | Disposition: A | Discharge status: Home / Self Care | Payer: Medicare Other | Source: Ambulatory Visit | Attending: Family Medicine | Admitting: Family Medicine

## 2010-11-10 DIAGNOSIS — I6529 Occlusion and stenosis of unspecified carotid artery: Secondary | ICD-10-CM

## 2010-12-09 LAB — CULTURE, RESPIRATORY W GRAM STAIN: Gram Stain: NONE SEEN

## 2010-12-09 LAB — AFB CULTURE WITH SMEAR (NOT AT ARMC): Acid Fast Smear: NONE SEEN

## 2010-12-09 LAB — COMPREHENSIVE METABOLIC PANEL
Alkaline Phosphatase: 52 U/L (ref 39–117)
BUN: 15 mg/dL (ref 6–23)
CO2: 32 mEq/L (ref 19–32)
Chloride: 97 mEq/L (ref 96–112)
Creatinine, Ser: 0.77 mg/dL (ref 0.4–1.5)
GFR calc non Af Amer: >60 mL/min (ref >60)
Glucose, Bld: 108 mg/dL — ABNORMAL HIGH (ref 70–99)
Potassium: 4.4 mEq/L (ref 3.5–5.1)
Total Bilirubin: 1 mg/dL (ref 0.3–1.2)

## 2010-12-09 LAB — FUNGUS CULTURE W SMEAR: Fungal Smear: NONE SEEN

## 2010-12-09 LAB — CBC
HCT: 44.1 % (ref 39.0–52.0)
Hemoglobin: 15.1 g/dL (ref 13.0–17.0)
RBC: 4.88 MIL/uL (ref 4.22–5.81)
RDW: 13.3 % (ref 11.5–15.5)

## 2010-12-09 LAB — PROTIME-INR
INR: 1.06 (ref 0.00–1.49)
Prothrombin Time: 13.7 seconds (ref 11.6–15.2)

## 2010-12-09 LAB — APTT: aPTT: 34 seconds (ref 24–37)

## 2011-01-20 NOTE — Letter (Signed)
August 14, 2009   Lajuana Matte, MD  [phone]  [place], [place] [address]   Re:  DOYT, CASTELLANA               DOB:  01-13-1928   Dear Arbutus Ped:   I saw the patient in the office today and he is doing well overall.  He  had a recent CT scan that showed a questionable polypoid lesion in the  distal trachea, which was probably just secretions, but because of this,  I will go ahead and schedule him for bronchoscopy on August 19, 2009.  His blood pressure was 139/70, pulse 88, respirations 18, and sats were  96%.  I appreciate the opportunity of seeing the patient.   Sincerely,   Ines Bloomer, M.D.  Electronically Signed   DPB/MEDQ  D:  08/14/2009  T:  08/14/2009  Job:  [id number]

## 2011-01-20 NOTE — Consult Note (Signed)
[name], [name]               ACCOUNT NO.:  [id number]   MEDICAL RECORD NO.:  [id number]          PATIENT TYPE:  INP   LOCATION:  4703                         FACILITY:  [facility]   PHYSICIAN:  Angelia Mould. Derrell Lolling, M.D.DATE OF BIRTH:  Aug 02, 1928   DATE OF CONSULTATION:  DATE OF DISCHARGE:                                 CONSULTATION   REASON FOR CONSULTATION:  Questionable ischemic colitis and colonic  volvulus.   HISTORY OF PRESENT ILLNESS:  This is a 77 year old white male with a  history of colon cancer and a sigmoidectomy in 1997.  There is no known  recurrence to date. He began to have diarrhea about 1 month ago.  At  this time a colonoscopy was done on June 23, 2007, and biopsies were  taken which showed possible chronic colitis.  After this colonoscopy,  the patient about 3 weeks of abdominal distention.  The past 3 days he  has begun to vomit without any blood.  He cannot handle or tolerate any  food by mouth and is currently having complaints of being dizzy and  weak, but he is without pain.  Dr. [name] arranged for an outpatient CT  to be done today in order to assess his abdomen.  The CT today showed a  sigmoid volvulus and the patient was then sent to [facility] ER to be  admitted.  After admission another colonoscopy was done to decompress  the patient by Dr. Randa Evens.  During this procedure he found some  ischemic bowel just proximal to the distal twist.  At this time his  white blood cell count was 12.9.  Because of these findings, we were  consulted.   REVIEW OF SYSTEMS:  See HPI.  He does complain of some right shoulder  pain.  All other systems are negative.   PAST MEDICAL HISTORY:  1. Colon cancer status post sigmoidectomy in 1997.  No other abdominal      surgeries.  2. Hypertension.  3. Hyperlipidemia.  4. Hypothyroidism.   PAST SURGICAL HISTORY:  Sigmoidectomy in 1997.  No other surgeries are  known at this time.   SOCIAL HISTORY:  The patient denies any  use of tobacco products, alcohol  or any other drugs.   MEDICATIONS:  1. Levoxyl.  2. Lipitor.  3. Lisinopril.  4. Mobic.  5. Potassium.  6. Chlorthalidone.  7. Pravastatin.  8. Xanax.  9. Viagra.  10.Betamethasone.   ALLERGIES:  1. OXYCONTIN.  2. MORPHINE WHICH CAUSES HALLUCINATIONS.   PHYSICAL EXAMINATION:  GENERAL:  This is a pleasant 77 year old white  male who is a little groggy after just coming out of a colonoscopy.  At  this time he does not have any fevers or chills.  VITAL SIGNS:  Blood pressure 119/56, pulse 89, respirations 19, 95% on  room air.  HEENT:  Head is normocephalic, atraumatic.  Sclerae noninjected.  NECK:  Supple with no lymphadenopathy or thyromegaly.  CHEST:  Clear to auscultation bilaterally.  No wheezes, rhonchi or rales  heard.  HEART:  Regular rate and rhythm.  No murmurs, gallops or rubs.  No JVD  noted.  ABDOMEN:  Soft, nontender, distended with tympani in the upper central  abdomen.  Lower midline scar present.  No hernias present.  EXTREMITIES:  Moving all 4 extremities with no cyanosis, clubbing or  edema.  NEUROLOGIC:  He is alert and oriented x3.  Moving all 4 extremities with  no focal deficits noted.   LABORATORY DATA:  White blood cell count is 12,900, hemoglobin 16,  neutrophils 89%.  Lipase is 30.  Sodium is 137, potassium 3.1, CO2 is  29, BUN is 51 and creatinine is 2.04.  CT shows a sigmoid volvulus with  prominent distention of the colon along with lung mass in the superior  portion of the left lower lobe in the lung.  Colonoscopy showed  questionable ischemic area in a loop of the colon.   IMPRESSION:  1. Large bowel sigmoid volvulus with prominent distention of the      colon, status post colonoscopic decompression.  2. Questionable ischemic areas of colon.  3. Renal insufficiency likely secondary to dehydration and      hypovolemia.  4. A new 3-cm lung mass in the superior left lower lobe of the lung.  5.  Leukocytosis.   PLAN:  1. At this time we agree with the continuation of the NG tube to help      decompress the patient's abdomen.  We also agree with keeping the      patient on n.p.o. status with bowel rest as well as Unasyn for      antibiotic therapy.  2. We will follow up on a CBC and a BMP in the morning.  3. We will also follow up on abdominal films in the morning to assess      for any new or unusual gas pattern.  4. We will continue to follow this patient for any signs of ischemic      bowel in the future.      Letha Cape, PA      [place]. Derrell Lolling, M.D.  Electronically Signed    KEO/MEDQ  D:  07/15/2007  T:  07/16/2007  Job:  [id number]

## 2011-01-20 NOTE — Assessment & Plan Note (Signed)
OFFICE VISIT   [name], [name]  DOB:  1927/09/16                                        May 08, 2008  CHART #:  [id number]   The patient came for followup today.  His blood pressure was 112/64,  pulse 99, respirations 18, and sats were 94%.  CT scan done was normal  with no evidence of recurrence at this early stage.  We will see him  again in 3 months with another CT scan.  Lungs were clear to  auscultation and percussion.  Incisions were well healed.  He has  completed his chemotherapy.   Ines Bloomer, M.D.  Electronically Signed   DPB/MEDQ  D:  05/08/2008  T:  05/09/2008  Job:  [id number]

## 2011-01-20 NOTE — Letter (Signed)
September 28, 2007   Blenda Nicely. Clelia Croft, M.D.  [address] [place], [place] [address]   Re:  [name], [name]               DOB:  [birth year]   Dear Dr. Clelia Croft:   I saw this patient back in the office today after his bronchoscopy and  mediastinoscopy.  His blood pressure was 118/62, pulse 88, respirations  20, sats were 92%.  His mediastinoscopy site had some swelling but was  healing well.  His biopsies showed no evidence of cancer in his  mediastinum.  We biopsied both 7, 4R, 4L nodes, and all showed  granulomatous disease which would go along with the one that might be  positive on PET.  So with a negative mediastinoscopy, I have elected to  recommend a left lower lobe superior segmentectomy and node dissection.  He understands that this carries some risk, but is probably the best  option for him, and we were planning to do this on the 6th of February  at [facility].   I appreciate the opportunity to see this patient.   Sincerely,   Ines Bloomer, M.D.  Electronically Signed   DPB/MEDQ  D:  09/28/2007  T:  09/28/2007  Job:  [id number]   cc:   Ollen Gross. Vernell Morgans, M.D.

## 2011-01-20 NOTE — Assessment & Plan Note (Signed)
OFFICE VISIT   [name], [name]  DOB:  09/22/1927                                        August 14, 2008  CHART #:  [id number]   The patient is status post left lower lobectomy done by Dr. Edwyna Shell on  October 09, 2007, with positive large-cell neuroendocrine carcinoma.  The patient continues to progress well.  He was seen and evaluated  yesterday by Dr. Arbutus Ped.  He comes back today for followup with a CT  scan.  The patient denies any shortness of breath, chest pain, fevers,  nausea, and vomiting.   PHYSICAL EXAMINATION:  VITAL SIGNS:  Blood pressure 150/73, pulse of 94,  respirations of 18, and O2 sats 98% on room air.  RESPIRATORY:  Clear to auscultation bilaterally.  CARDIAC:  Regular rate and rhythm.   STUDIES:  The patient had a CT scan done on August 08, 2008, which  showed tiny left pleural effusion decreased in size since prior study as  well as no other significant changes since prior study.  No evidence of  new or progressive disease.   IMPRESSION AND PLAN:  The patient continues to progress well.  He was  seen and evaluated by Dr. Edwyna Shell.  Dr. Edwyna Shell evaluated the patient's CT  scan.  We will plan to follow back up with the patient in 6 months  following a repeat CT scan, which it has been ordered by Dr. Arbutus Ped.   Ines Bloomer, M.D.  Electronically Signed   KMD/MEDQ  D:  08/14/2008  T:  08/15/2008  Job:  [id number]   cc:   Ines Bloomer, M.D.  Lajuana Matte, MD

## 2011-01-20 NOTE — H&P (Signed)
[name], [name]               ACCOUNT NO.:  [id number]   MEDICAL RECORD NO.:  [id number]          PATIENT TYPE:  INP   LOCATION:  4703                         FACILITY:  [facility]   PHYSICIAN:  [name], M.D. DATE OF BIRTH:  1928/05/29   DATE OF ADMISSION:  07/15/2007  DATE OF DISCHARGE:                              HISTORY & PHYSICAL   [name] was sent to the emergency room today, after having a CT scan  done that showed colonic volvulus.   HISTORY OF PRESENT ILLNESS:  This is a 77 year old male with a history  of colon cancer and sigmoidectomy in 1997, complaining of 6 weeks' worth  of diarrhea.  His abdominal distention began soon after his June 23, 2007 colonoscopy.  He began vomiting approximately 3 days ago, states  that he has seen no blood in his emesis.  Now he can take very little  p.o.'s possibly just some Ginger Ale.  He states he has lost 11 pounds  in the past month.  His last bowel movement was yesterday evening.  He  describes it as being thick and black.  The patient feels very weak and  dizzy.  He is not in any pain.  He was recently taking Mobic for his  right shoulder pain.  Labs yesterday show slight renal insufficiency.  CT scan today shows new lung mass, as well as sigmoid volvulus.   PAST MEDICAL HISTORY:  Is significant for:  1. Colon cancer.  He is status post sigmoid colectomy in 1997.  2. Hypertension.  3. Hyperlipidemia.  4. Hypothyroidism.   PRIMARY CARE PHYSICIAN:  [name].   GASTROENTEROLOGIST:  [name].   His last colonoscopy was done on October 16 by [name].  The  impression was very poor prep.  Repeat colon to be scheduled at next  office visit.  He did have erythematous vascular pattern that was  biopsied.  The pathology showed chronic mucosal colitis.   CURRENT MEDICATIONS:  Include:  1. Levoxyl 112 mcg daily.  2. Mobic 7.5 mg daily.  3. Potassium 10 mg b.i.d.  4. Align, a  multivitamin.  5. Chlorthalidone 25 mg daily.  6. Lipitor 10 mg daily.  7. Lisinopril 20 mg daily.  8. Pravastatin 40 mg 1 tablet daily.  9. Fluocinonide cream.  Apply to affected area b.i.d.  10.Betamethasone q.h.s.  11.Viagra 100 mg p.r.n.  12.Xanax 0.5 mg q.h.s.   ALLERGIES:  He has allergies to OxyContin and morphine, states morphine  causes him to have hallucinations.   REVIEW OF SYSTEMS:  Significant for right shoulder pain.  He is not  experiencing any shortness of breath, palpitations, or chest pain.   SOCIAL HISTORY:  Significant for no alcohol, drugs or tobacco.   FAMILY HISTORY:  He states that he has a son with colon polyps, but  there is no history of colon cancer other than his own.   PHYSICAL EXAM:  GENERAL:  He is alert and oriented but appears weak.  VITAL SIGNS:  His temperature is 97.5, pulse is 110, respirations are  16.  Blood pressure is 81/61.  CARDIOVASCULAR:  Has a regular rate and rhythm.  ABDOMEN:  Very distended, firm, tympanic, nontender.  LOWER EXTREMITIES:  Have good pulses and demonstrate no edema.   LABORATORY:  Done yesterday, show a hemoglobin of 16.7, hematocrit 48.4,  white count 12.4, platelets 296,000.  His Chem-7 shows sodium 144,  potassium 3.9, chloride 97, bicarb 35, BUN 39, creatinine 1.70, glucose  141.  His calcium is 10.5.  LFTs are within normal limits.  A serum  albumin is 4.1.   On CT scan done today, November 7:  1. Sigmoid volvulus with prominent distention of the colon and air-      fluid levels throughout.  2. New 3-cm lung mass in the superior left lower lobe.   ASSESSMENT:  [name] has seen and examined the patient and  will admit him to the gastroenterology service.   DIAGNOSES:  Colonic volvulus, new lung mass, hypovolemia, mild renal  insufficiency.   PLAN:  For gentle rehydration, decompressive colonoscopy, pulmonary  consult for new lung mass.      Craig Police, PA     ______________________________  [name] Randa Watts, M.D.    MLY/MEDQ  D:  07/15/2007  T:  07/16/2007  Job:  [id number]   cc:   [name], M.D.  Craig L. Malon Kindle., M.D.  [name], M.D.

## 2011-01-20 NOTE — H&P (Signed)
[name], [name]               ACCOUNT NO.:  [id number]   MEDICAL RECORD NO.:  [id number]          PATIENT TYPE:  INP   LOCATION:  5707                         FACILITY:  [facility]   PHYSICIAN:  [name], M.D.    DATE OF BIRTH:  01-18-1928   DATE OF ADMISSION:  08/01/2007  DATE OF DISCHARGE:                              HISTORY & PHYSICAL   CHIEF COMPLAINT:  Abdominal distention.   HISTORY OF ILLNESS:  The patient is a 77 year old white male readmitted  after recurrence of gradual abdominal distention beginning 3 days ago  with nausea and fullness.  He had been admitted from November 7-14 with  colonic obstruction versus pseudo-obstruction with volvulus felt to be  the most likely etiology.  Interestingly, the patient had a sigmoid  resection for sigmoid colon carcinoma in 1998.  During the last  admission Dr. Randa Watts was able to successfully decompress him short-term  as mentioned.  He was have some hypokalemia and was started on potassium  supplementation at the time of discharge.  He was also to have an  incidental lung mass on chest x-ray and underwent a CT-guided biopsy on  November 12, which did return as poorly-differentiated carcinoma thought  to a be lung primary.  There was some suggestion of neuroendocrine  differentiation.  The patient was scheduled to see Dr. Clelia Watts in initial  consultation regarding this as an outpatient.  The patient called Dr.  Matthias Watts today after doing fairly well on follow-up outpatient  appointment on November 20, calling with complaints of worsening  abdominal distention over the last 3 days.  He has had frequent small-  volume, mainly runny bowel movements with the last one approximately 6  hours ago.  He has not had any vomiting as he did on his initial  presentation.   PAST MEDICAL HISTORY:  1. Sigmoid colon cancer.  2. Hypertension.  3. Hyperlipidemia.  4. Hypothyroidism.  5. Recent diagnosis of lung cancer.   SURGERY:  Sigmoid  colectomy 1997 and 1998.   MEDICATIONS:  Levoxyl, chlorthalidone, lisinopril, pravastatin,  potassium.   ALLERGIES:  OxyCONTIN and MORPHINE.   SOCIAL HISTORY:  The patient denies alcohol or tobacco use.   FAMILY HISTORY:  A son has colon polyps.   PHYSICAL EXAM:  Well-developed and well-nourished, moderately obese  white male in no acute distress.  HEART:  Regular rate and rhythm without murmurs.  LUNGS:  Clear.  ABDOMEN: Semi-taut, distended, with some palpable bowel loops and  decreased bowel sounds.  No hepatosplenomegaly, mass or guarding.   IMPRESSION:  1. Recurrent sigmoid volvulus versus ileus.  2. Recent discovery of poorly-differentiated lung cancer.   PLAN:  Will probably need a follow-up or decompressive colonoscopy, but  given the recurrent nature of his problem will consult surgery to see if  they favor surgical resection before proceeding with it.  Will also  involve Dr. Clelia Watts as appropriate depending on the length of the current  admission and clinical course.           ______________________________  [name]. Madilyn Watts, M.D.     JCH/MEDQ  D:  08/01/2007  T:  08/01/2007  Job:  [id number]   cc:   Craig [place] Daphine Deutscher, MD  [name], M.D.

## 2011-01-20 NOTE — Letter (Signed)
October 20, 2007   Charlaine Dalton. Sherene Sires, MD, FCCP  [address]  [place] [address]   Re:  [name], [name]               DOB:  08-08-1928   Dear Kathlene November:   I saw the patient in the office today.  He is doing well after his left  lower lobectomy.  His incisions are well healed.  We removed his chest  tube sutures.   His blood pressure was 112/67, pulse 100, respirations 20, sats were  94%.   I told him he could start driving in about 10 days, and he will be  seeing Dr. Arbutus Ped.  Unfortunately, since he did have a stage IIB non-  small cell lung cancer, he will require chemotherapy.  I will see him  back again in 3 weeks.   Ines Bloomer, M.D.  Electronically Signed   DPB/MEDQ  D:  10/20/2007  T:  10/21/2007  Job:  [id number]

## 2011-01-20 NOTE — Op Note (Signed)
[name], [name]               ACCOUNT NO.:  [id number]   MEDICAL RECORD NO.:  [id number]          PATIENT TYPE:  INP   LOCATION:  5704                         FACILITY:  [facility]   PHYSICIAN:  Ollen Gross. Vernell Morgans, M.D. DATE OF BIRTH:  1928-05-26   DATE OF PROCEDURE:  08/02/2007  DATE OF DISCHARGE:                               OPERATIVE REPORT   DATE OF PROCEDURE:  August 02, 2007.   PREOPERATIVE DIAGNOSIS:  Sigmoid colon volvulus.   POSTOPERATIVE DIAGNOSIS:  Sigmoid colon volvulus.   PROCEDURES:  1. Exploratory laparotomy.  2. Sigmoid colectomy.  3. Descending colostomy.   SURGEON:  Dr. Ollen Gross. Carolynne Edouard III   ASSISTANT:  Dr. Clovis Pu. Cornett   ANESTHESIA:  General endotracheal.   PROCEDURE IN DETAIL:  After informed consent was obtained, the patient  was brought to the operating room and placed in the supine position on  the operating table.  After adequate induction of general anesthesia,  the patient's abdomen was prepped with Betadine and draped in the usual  sterile manner.  A midline incision was made with a 10 blade knife.  This incision was carried down through the skin and subcutaneous tissue  sharply with the electrocautery until the linea alba was identified.  The linea alba was also incised with the electrocautery.  The  preperitoneal space was probed bluntly with a hemostat until the  peritoneum was opened and access was gained to the abdominal cavity.  The rest of the incision was then opened under direct vision.  There  were actually very few adhesions to the abdominal wall.  The patient was  placed in the Trendelenburg position.  A Balfour retractor was applied.  The small bowel was able to be packed into the upper abdomen with moist  laps.  The sigmoid colon was very dilated and easily identified.  It was  very floppy and redundant.  The sigmoid colon appeared to be the only  site of distention.  The patient earlier in the day had also undergone  some  decompression by a colonoscopy by gastroenterology, but the site of  the area of distention appeared to be centered around the patient's old  resection.  At this point, a site above the above the area of distention  where the colon appeared to be healthy and normal was chosen and the  mesentery at this point was opened sharply with the electrocautery.  A  GIA 75 stapler was placed across the colon at this point, clamped and  fired thereby dividing the colon between staple lines.  The mesentery to  the sigmoid colon was then taken down with the LigaSure and the main  vessels were also suture ligated with 2-0 silk stitches.  The ureter was  also identified and was kept very deep to the dissection.  The  dissection was carried down to the rectosigmoid reflection and the  dissection of the mesentery at this point was carried to the colon wall.  At this point, the rectosigmoid was also divided with 2 firings of a GIA  75 stapler and the Hartmann pouch was allowed  to retract down into the  pelvis.  The staple lines were all hemostatic.  The rest of the abdomen  was inspected and no other significant abnormalities were noted.  The  abdomen was then irrigated with copious amounts of saline.  The site was  chosen on the left abdominal wall for a descending colostomy and a  circular portion of the skin and subcutaneous fat was then excised  sharply with electrocautery.  A cruciate incision was made on the fascia  of the abdominal wall.  A Babcock was then placed through up this  opening on the abdominal wall and used to grasp the staple line of the  descending colon.  The colon was brought through this opening without  difficulty.  The descending colon appeared to be healthy and viable.  The anterior abdominal wall fascia was then closed with 2 running #1  looped PDS sutures.  The subcutaneous tissue was irrigated with copious  amounts of saline and Betadine and the skin was closed with staples.   This was then covered with a sterile green towel.  The staple line of  the ostomy was then excised sharply with the electrocautery and the  ostomy was matured with 3-0 Vicryl stitches and sterile dressings and an  ostomy bag was then applied.  The patient tolerated the procedure well.  At the end of the case all needle, sponge and instrument counts were  correct.  The patient was then awakened and taken to recovery in stable  condition.      Ollen Gross. Vernell Morgans, M.D.  Electronically Signed     PST/MEDQ  D:  08/04/2007  T:  08/04/2007  Job:  [id number]

## 2011-01-20 NOTE — Letter (Signed)
August 21, 2009   Mohamed K. Arbutus Ped, MD  [address]  [place], [place] [address]   Re:  [name], [name]               DOB:  09-01-1928   Dear Arbutus Ped,   I saw the patient in the office today and I have reviewed his  bronchoscopy results.  There was no evidence of any cancer.  I think  this is just an area of mucus that was seen on the CT scan.  His blood  pressure was 116/62, pulse 92, respirations 18, sats were 96%.  Biopsy  of his bronchial stump was negative and his cytologies were negative.  I  will be happy to see him again if he has any future problems.   Sincerely,   Ines Bloomer, M.D.  Electronically Signed   DPB/MEDQ  D:  08/21/2009  T:  08/21/2009  Job:  [id number]

## 2011-01-20 NOTE — H&P (Signed)
NAMENORLAN, RANN               ACCOUNT NO.:  [id number]   MEDICAL RECORD NO.:  [id number]          PATIENT TYPE:  INP   LOCATION:  NA                           FACILITY:  [facility]   PHYSICIAN:  Ines Bloomer, M.D. DATE OF BIRTH:  09/01/1928   DATE OF ADMISSION:  DATE OF DISCHARGE:                              HISTORY & PHYSICAL   CHIEF COMPLAINT:  Left lung mass.   HISTORY OF PRESENT ILLNESS:  This 77 year old patient was admitted to  the hospital with a sigmoid volvulus requiring decompression and then a  resection with colostomy by Dr. Carolynne Edouard.  A CT scan done at that time  showed a 1.7 x 3.2 cm superior segmental lesion in the left lower lobe.  Needle biopsy was done and showed cancer with some neuroendocrine  features. However there was some question about whether this was cancer  or not.  A PET scan was done which was positive with hilar mediastinal  and subcarinal adenopathy.  I went ahead and did a bronchoscopy and  mediastinoscopy on him and mediastinoscopy was negative for  a level of 4R, 4L, 2R and level 7 nodes.  These all showed fibrous  nodules, which makes [place] think that this could be some type of  inflammatory situation secondary to previous tuberculous or a fungal  infection such as histoplasmosis.  It was elected to proceed with  resection of this resection of the lesion.  His pulmonary function tests  showed an FVC of 2.97, FEV1 of 2.12.   PAST MEDICAL HISTORY:  1. Significant for colon cancer and he had a sigmoid colectomy in      1997.  2. Hypertension.  3. Hyperlipidemia.  4. Previous history of colitis.  5. Mild renal insufficiency.   ALLERGIES:  MORPHINE AND OXYCONTIN - cause confusion.   MEDICATIONS:  1. Lisinopril 20 mg daily.  2. Pravastatin 80 mg daily.  3. Levoxyl 1.112 mcg daily.   FAMILY HISTORY:  Positive for cardiac disease.   SOCIAL HISTORY:  He is married.  Quit smoking many years ago.  Does not  drink alcohol on a regular basis. Has  three children.   REVIEW OF SYSTEMS:  CONSTITUTIONAL:  He has had some recent weight loss.  CARDIAC:  No angina or atrial fibrillation. PULMONARY:  No hemoptysis,  fever, chills or asthma.  GI:  See history of present illness. GU:  See  past medical history.  No frequent urination.  VASCULAR:  No  claudication, DVT or TIAs.  NEUROLOGIC:  No headaches, black outs,  seizures or dizziness.  MUSCULOSKELETAL:  No joint pain or rash.  PSYCHIATRIC:  No problems with depression or nervousness.  EYE/ENT:  No  change in his eyesight or hearing.  HEMATOLOGICAL:  No problems with  bleeding, no clotting disorders.   PHYSICAL EXAMINATION:  GENERAL:  He is a well-developed Caucasian male  in no acute distress.  VITAL SIGNS:  Blood pressure is 120/70, pulse 85, respirations 18,  saturating 94%.  HEENT:  Head is atraumatic, eyes - pupils equal, reactive to light and  accommodation, extraocular movements are normal, ears -  tympanic  membranes intact. Nose - there is no septal deviation. Throat is without  lesions.  NECK:  Supple without thyromegaly. There is no supraclavicular or  axillary adenopathy.  CHEST:  Clear to auscultation and percussion.  HEART:  Regular sinus rhythm, there is a 1/6 systolic ejection murmur.  ABDOMEN:  Soft, there are surgical scars. There is a colostomy pouch.  Bowel sounds are normal.  EXTREMITIES:  Pulses are 2+, there is no clubbing or edema.  NEUROLOGIC:  He is oriented x3, sensory and motor intact. Cranial nerves  are intact.  SKIN:  Without lesion.   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  1. Hypertension.  2. History of colon cancer.  3. Status post a sigmoid volvulus.  4. Hypothyroidism.  5. Hyperlipidemia.  6. Mild renal insufficiency.   PLAN:  Left lower lobe superior segmentectomy or left lower lobectomy.      Ines Bloomer, M.D.  Electronically Signed     DPB/MEDQ  D:  10/06/2007  T:   10/06/2007  Job:  [id number]

## 2011-01-20 NOTE — Assessment & Plan Note (Signed)
OFFICE VISIT   KRISHANG, READING  DOB:  09-15-1927                                        December 14, 2007  CHART #:  [id number]   OFFICE VISIT:  The patient came for follow-up today.  His incision is  well-healed.  His lungs are clear to auscultation and percussion.  His  pulse was 100, respirations 18, saturation was 92%, blood pressure was  120/70.  He has had two rounds of chemotherapy.  Will see him back again  in six weeks with another chest x-ray.   Ines Bloomer, M.D.  Electronically Signed   DPB/MEDQ  D:  12/14/2007  T:  12/14/2007  Job:  [id number]

## 2011-01-20 NOTE — Discharge Summary (Signed)
NAMEDAVINCI, GLOTFELTY NO.:  [id number]   MEDICAL RECORD NO.:  [id number]          PATIENT TYPE:  INP   LOCATION:  2007                         FACILITY:  [facility]   PHYSICIAN:  Ines Bloomer, M.D. DATE OF BIRTH:  10-07-1927   DATE OF ADMISSION:  10/10/2007  DATE OF DISCHARGE:  10/15/2007                               DISCHARGE SUMMARY   HISTORY OF PRESENT ILLNESS:  The patient is a 77 year old male referred  to Dr. Edwyna Shell in thoracic surgical consultation.  The patient has a  recent admission and hospitalization for a sigmoid volvulus that  required decompression and resection with a colostomy done by Ollen Gross.  Vernell Morgans, M.D.  CT scan done at that time did show a 1.7 x 3.2 cm  superior segmental lesion of the left lower lobe of the lung.  Subsequent to this a lung biopsy revealed carcinoma with neuroendocrine  features.  The patient then had a mediastinoscopy which was negative  only showing fibrous nodules.  The patient had a PET scan which was  positive for hilar, mediastinal, and subcarinal adenopathy.  The patient  was admitted this hospitalization for resection.   PAST MEDICAL HISTORY:  1. Colon cancer status post sigmoid colectomy in 1997.  2. Hypertension.  3. Hyperlipidemia.  4. History of colitis.  5. History of renal insufficiency, mild.   ALLERGIES:  No known drug allergies, but he does have intolerance to  MORPHINE and OXYCONTIN causing significant confusion.   ADMISSION MEDICATIONS:  1. Lisinopril 20 mg daily.  2. Pravastatin 80 mg daily.  3. Levoxyl 112 mcg daily.   FAMILY HISTORY:   SOCIAL HISTORY:   REVIEW OF SYSTEMS:   PHYSICAL EXAMINATION:  Please see the history and physical done at the  time of admission.   HOSPITAL COURSE:  The patient was admitted electively and on October 10, 2007, taken to the operating room where he underwent a left video-  assisted thoracoscopy with left lower lobectomy and lymph node  dissection.   Findings initially were consistent with nonsmall cell lung  cancer.  Pathology has subsequently confirmed this.  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  Additionally, two lymph  nodes at level 11L were also significant for metastatic neuroendocrine  carcinoma.  The patient's postoperative course has been mostly  unremarkable.  All routine lines, monitors, and drainage devices have  been discontinued in the standard fashion.  He does have a mild  postoperative acute blood loss anemia with hemoglobin and hematocrit  dated October 12, 2007, at 43 and 34, respectively.  Incision shows good  healing.  He has responded well to routine pulmonary toilette and  rehabilitation modalities.  He has been staged at T2 N1 M0 which is 2B  and Lajuana Matte, M.D. from the [facility] has seen the patient.  He will continue to see the patient as an outpatient for follow-up  chemotherapy.  The patient was evaluated on October 15, 2007, by Dr.  Edwyna Shell on morning rounds and deemed to be acceptable for discharge at  that time.   DISCHARGE  MEDICATIONS:  1. Lisinopril 20 mg daily.  2. Levoxyl 112 mcg daily.  3. Pravastatin 80 mg daily.  4. Chlorthalidone 25 mg daily.  5. Potassium chloride 20 mEq daily.  6. For pain, he will be given a prescription for Ultram 50 mg q.6      hours p.r.n. as needed.   FOLLOWUP:  Dr. Edwyna Shell on Thursday of next week with a repeat chest x-ray  at that time.  The patient will follow up at the [facility]  with Dr. Arbutus Ped in two weeks.   DISCHARGE DIAGNOSES:  1. Neuroendocrine carcinoma, large cell as described T2 N1.  2. History of colon cancer.  3. History of hypertension.  4. Hyperlipidemia.  5. Colitis.  6. Sigmoid volvulus.  7. Colostomy.  8. Renal insufficiency, mild.      Rowe Clack, P.A.-C.      Ines Bloomer, M.D.  Electronically Signed    WEG/MEDQ  D:  10/15/2007  T:  10/17/2007  Job:  [id number]

## 2011-01-20 NOTE — Assessment & Plan Note (Signed)
OFFICE VISIT   KYRE, JEFFRIES  DOB:  [birth year]                                        February 05, 2009  CHART #:  [id number]   The patient returns today and there is almost complete resolution of his  left pleural effusion.  He is doing well overall since we saw him last.  We will see him back again.  He is now over 2 years since his  neuroendocrine.  We will see him back again in 6 months when he gets his  next CT scan.  His blood pressure is 131/71, pulse 77, respirations 18,  and saturations were 92%.   Ines Bloomer, M.D.  Electronically Signed   DPB/MEDQ  D:  02/05/2009  T:  02/06/2009  Job:  [id number]

## 2011-01-20 NOTE — Op Note (Signed)
NAMEDONALDSON, [name]               ACCOUNT NO.:  [id number]   MEDICAL RECORD NO.:  [id number]          PATIENT TYPE:  INP   LOCATION:  5704                         FACILITY:  [facility]   PHYSICIAN:  John C. Madilyn Fireman, M.D.    DATE OF BIRTH:  11/21/1927   DATE OF PROCEDURE:  08/02/2007  DATE OF DISCHARGE:                               OPERATIVE REPORT   INDICATIONS FOR PROCEDURE:  Recurrent sigmoid volvulus.   PROCEDURE:  The patient was placed in the left lateral decubitus  position and placed on the pulse monitor with continuous low-flow oxygen  delivered by nasal cannula.  He was sedated with 25 mcg IV of fentanyl  and 2 mg of IV Versed.  The Olympus video colonoscope was inserted into  the rectum and advanced up to the descending colon.  Due to distention  of the colon and lengthening, I was unable to reach the cecum even with  the scope fully inserted.  There was an appreciation of a corkscrew soft  volvulus appearance in the sigmoid colon with proximal dilatation which  was easily traversed with the scope.  There was a lot of brown mucoid  and liquid stool in various dependent locations of the colon proximal to  this area.  This was all suctioned out as best as could be accomplished.  There was drastic reduction in the lumen of the caliber of the colonic  lumen and considerable flattening of his abdominal contour.  As much  fluid and air was suctioned out as possible and the scope gradually  withdrawn.  No specific lesions were seen even though there were some  small excoriated areas in the vicinity of volvulus, felt possibly from  focal ischemia.  The scope was then withdrawn, and the patient returned  to the recovery room in stable condition.  He tolerated the procedure  well.  There were no immediate complications.   IMPRESSION:  Sigmoid volvulus easily traversed and reduced with good  decompression.  No other mechanical obstructing lesions or neoplasms  seen.   PLAN:  Current  plan is for the patient to go the operating room for  surgical correction of the recurrent volvulus today.           ______________________________  [name], M.D.     JCH/MEDQ  D:  08/02/2007  T:  08/02/2007  Job:  [id number]   cc:   Ollen Gross. Vernell Morgans, M.D.

## 2011-01-20 NOTE — Op Note (Signed)
[name], [name]               ACCOUNT NO.:  [id number]   MEDICAL RECORD NO.:  [id number]          PATIENT TYPE:  INP   LOCATION:  2860                         FACILITY:  [facility]   PHYSICIAN:  James L. Malon Kindle., M.D.DATE OF BIRTH:  06-03-1928   DATE OF PROCEDURE:  07/15/2007  DATE OF DISCHARGE:                               OPERATIVE REPORT   PROCEDURE:  Sigmoidoscopy with decompression of volvulus.   MEDICATIONS:  Fentanyl 50 mcg, Versed 3 mg IV.   INDICATIONS:  A 77 year old gentleman who approximately 10 years ago had  reported sigmoid resection for cancer, apparently no chemotherapy was  required.  The patient has not been followed up by anyone since that  time and has had no recurrence.  He had a follow-up colonoscopy  approximately 2 weeks ago by Dr. [name] with no significant findings.  He presented with nausea and vomiting, abdominal distention.  CT scan  ordered as an outpatient by Dr. [name] showed a probable sigmoid  volvulus.   DESCRIPTION OF PROCEDURE:  The procedure had been explained to the  patient and consent obtained.  He was a mildly sedated and had been  given some tap water enemas.  We used the pediatric Pentax scope and  inserted.  It was clean up until we got to the first twist.  After we  got through with that there was an enormous amount of liquid stool and  air.  This was decompressed.  We were never able to be clean out the  loop.  There were several areas of dark, dusky mucosa, one large one in  particular that was photographed.  We went up to the second twist of the  loop and got through this with the scope but I immediately encountered a  large amount of solid stool and could not see anything.  At this point I  had to pull back and we placed a 7-French decompression tube through the  scope, pulled out the guidewire and left the tube in place, withdrew the  scope over the tube, sucking air out as we came.  At the termination of  the procedure  the decompression tube was actively sucking out stool and  air.   ASSESSMENT:  Sigmoid volvulus decompressed with concern over possible  ischemia.  Hopefully, it will not be transmural.   PLAN:  We will place the patient on antibiotics, keep the n.p.o., check  KUBs, correct electrolytes and give IV antibiotics.           ______________________________  Llana Aliment Malon Kindle., M.D.     Waldron Session  D:  07/15/2007  T:  07/16/2007  Job:  [id number]   cc:   Mosetta Putt, M.D.  Angelia Mould. Derrell Lolling, M.D.  Bernette Redbird, M.D.

## 2011-01-20 NOTE — Op Note (Signed)
[name], [name]               ACCOUNT NO.:  [id number]   MEDICAL RECORD NO.:  [id number]          PATIENT TYPE:  INP   LOCATION:  2302                         FACILITY:  [facility]   PHYSICIAN:  Ines Bloomer, M.D. DATE OF BIRTH:  [birth year]   DATE OF PROCEDURE:  10/09/2007  DATE OF DISCHARGE:                               OPERATIVE REPORT   PREOPERATIVE DIAGNOSIS:  Non-small cell lung cancer, left lower lobe  superior segment.   POSTOPERATIVE DIAGNOSIS:  Non-small cell lung cancer, left lower lobe  superior segment.   OPERATION/PROCEDURE:  1. Left video-assisted thoracoscopy, mini thoracotomy.  2. Left lower lobectomy.   SURGEON:  Ines Bloomer, M.D.   ANESTHESIA:  General.   DESCRIPTION OF PROCEDURE:  After placement of all monitoring lines, the  patient underwent general anesthesia.  He was turned in the left lateral  thoracotomy position, was prepped and draped in the usual sterile  manner.  Two trocar sites were made in the anterior and posterior  axillary line at the seventh intercostal space.  Two trocars were  inserted.  A 30-degree scope was inserted.  The lesion was seen in the  anterior superior segment of the left lower lobe.  We then made a small  7-8 cm incision anterolaterally.  Dissection was carried down partially  and divided in the latissimus in the 6th intercostal space.  Then under  scope  guidance, we took down the inferior pulmonary ligament with  electrocautery, dissected out the superior pulmonary vein.  I then  dissected out the posterior mediastinum and encountered multiple nodes,  11 L and 10 L nodes.  Dissected superiorly up into the aortopulmonary  window where we removed a #6 node.  Then we dissected the anterior  mediastinum, dissecting out again several 11 L nodes.  We then dissected  posteriorly down the bronchus and got a 7 node.  This was then done.  Attention was turned to the fissure.  The superior portion of the  fissure was  divided with electrocautery.  The inferior portion of the  fissure was divided with an Auto-Suture stapler.  This exposed the  fissure in the superior segmental arteries to the left lower lobe.  These were dissected out and several nodes were dissected off these  arteries and then this was stapled and divided with an Auto-Suture  stapler.  Then we divided the inferior pulmonary vein with the Auto-  Suture stapler and finally brought in a TA-30 through the small  incision, placed it on the left lower lobe bronchus, expanded the lung  and the left upper lobe expanded easily.  Stapled the left lower lobe  bronchus and divided it with knife and since it was a small incision,  placed the left lower lobe into an Endobag and removed it.  Frozen  section revealed a neuroendocrine cancer, probably large cell, and  negative margins.  CoSeal was applied to the staple line.  A right angle  and straight chest tube were placed in the usual fashion.  A single ON-Q  was placed in  the usual fashion.  Marcaine block was  done in the usual fashion.  The  chest was closed with two pericostals going through the 7th rib and  passed around the 5th rib.  A #1 Vicryl in the muscle layer and 3-0  Vicryl subcuticular stitch.  The patient was returned to the recovery  room in stable condition.      Ines Bloomer, M.D.  Electronically Signed     DPB/MEDQ  D:  10/10/2007  T:  10/10/2007  Job:  [id number]

## 2011-01-20 NOTE — Discharge Summary (Signed)
[name], [name]               ACCOUNT NO.:  [id number]   MEDICAL RECORD NO.:  [id number]          PATIENT TYPE:  INP   LOCATION:  5704                         FACILITY:  [facility]   PHYSICIAN:  Dorisann Frames, M.D.   DATE OF BIRTH:  25-Apr-1928   DATE OF ADMISSION:  08/01/2007  DATE OF DISCHARGE:  08/10/2007                               DISCHARGE SUMMARY   OPERATING SURGEON:  Dr. Carolynne Edouard.   CONSULTATIONS:  Dr. Tyson Alias with pulmonology.   REASON FOR ADMISSION:  This is a 77 year old white male who was  readmitted this admission after a recurrence of gradual abdominal  distention beginning three days prior to admission with nausea and  fullness.  He had been admitted from November 7 through the 14th with  colonic obstruction versus pseudo obstruction with volvulus, which was  felt to be the most likely etiology.  The volvulus at this time was felt  to be a sigmoid volvulus.  However, interestingly enough, the patient  had a sigmoid resection for sigmoid colon cancer in 1998.  Therefore,  the volvulus at this time was of part of the colon that was taking the  place of his sigmoid.  During the last admission, Dr. Randa Evens was able  to successfully decompress the patient with a colonoscope.  Also, on  last admission, an incidental lung mass was found on chest x-ray, and he  underwent a CT-guided biopsy on November 12, which did return a poorly  differentiated carcinoma that was thought to be a primary lung cancer.  The patient at this time was scheduled to see Dr. Clelia Croft in initial  consultation regarding the workup of this lung mass on an outpatient  basis.   The patient at this time followed up with Dr. [name] on November 20  with a satisfactory appointment.  However, he returned to see Dr.  [name] on November 24 with complaints of worsening abdominal distention  over the last several days.  At this time, he had several small-volume  runny bowel movements, with the last one  approximately 6 hours before  admission.  At this time, he had not had any vomiting as he had on his  initial presentation.   Once the patient was admitted, surgery was consulted for possible  surgical resection of the sigmoid volvulus.  Once evaluated by Dr. Carolynne Edouard,  it was felt that before surgical intervention, it would be necessary to  decompress his colon via colonoscopy.   ADMITTING DIAGNOSES:  1. Recurrent sigmoid volvulus.  2. Recently discovered poorly differentiated lung cancer.   HOSPITAL COURSE:  On November 25, the day after the patient was  admitted, Dr. Madilyn Fireman took the patient to the endoscopy lab and performed  a colonoscopy in order to decompress the patient's colon.  This was done  successfully.  On the following day, the patient was taken to the OR for  a sigmoid colectomy/colostomy.  The patient returned to the room in  stable condition.  The first few days after surgery, the patient had  decreased in his colostomy output.  Therefore, the patient was started  on clear liquids  to help see if this would get his bowels moving and  increase his output.  However, after started on sips of clear liquid,  the patient became more distended and complained of some nausea.  At  this time, we felt the patient was developing a postoperative ileus.  X-  rays were obtained, which showed a partial small-bowel obstruction, and  at this time, the patient was changed back to a n.p.o. status and  started on Reglan.   By postoperative day 4, the patient began to have some bowel sounds and  stool and air in his back.  At this time, he was advanced to clear  liquids, which he tolerated, and then was advanced in his diet as  tolerated.  Once the patient began to tolerate his diet, the Reglan was  discontinued at this time.  The patient's incision was also stable  throughout this whole time.  He was discharged with his staples intact,  with an order for home health to remove his staples on  postoperative day  10, which is Friday, December 5.   On postoperative day 1, the patient began to drop his O2 saturations at  night.  At this time, the patient was put on 2 liters of oxygen.  Over  the next couple of days, I questioned the patient about possible signs  and symptoms of obstructive sleep apnea, to which he admitted snoring,  but his wife denied any pauses in breathing at night.  At this time, I  recommended that on an outpatient basis he might need to have himself  worked up for possible sleep apnea.  On postoperative day 6, the patient  was surgically ready to be discharged; however, when his oxygen was  removed, he desaturated down to 86% at rest.  A chest x-ray at this time  was obtained, which showed bilateral pleural effusions, the left was  greater than the right.  However, these were shown to be small to  moderate in fluid.  I decubitus view was also taken, which showed some  layering.  At this time, we had the patient go down for a thoracentesis;  however, interventional radiology did not feel like they could tap the  effusion due to the small size.  At this time, a pulmonology consult was  requested.  The patient at this time was worked up for a pulmonary  embolus, which turned out to be negative.  Pulmonary at this time also  questioned the family about signs and symptoms of obstructive sleep  apnea.  At this time, the patient and his wife did admit to the patient  having some pauses in breathing during the night, as well as snoring.  Pulmonary at this time recommended a workup for this problem.   At this time, which is postoperative day 8, we discontinued the  patient's oxygen and again tried him on room air, at which he only  dropped his oxygen from 97% on 2 liters to 90-91% on room air.  He also  at this time did not complain of feeling short of breath.  At this time,  pulmonology felt that the patient was stable for discharge without  oxygen needs at home.   They recommended a pulmonology followup with Dr.  Shelle Iron, to which an appointment was scheduled for December 23 at 1:15  p.m.  It was also felt at this time that he would need to see a  pulmonologist anyway with potential thoracic surgery in his future for  treatment of the newly found lung nodule.   The patient also struggled throughout his postoperative period with some  deconditioning; however, physical therapy began to work with him, as  well as offered him a walker.  He began to greatly improve his  conditioning.  However, at the time of discharge, it was felt that home  health still needed to probably work up this patient for physical  therapy needs.  The patient was sent home at this time with home health  for physical therapy, as well as for colostomy care.   DISCHARGE DIAGNOSES:  1. Recurrent sigmoid volvulus, status post sigmoid      colectomy/colostomy.  2. Mild respiratory failure/bilateral pleural effusions with the left      greater than the right/desaturation of oxygen down to 86%.  3. 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   DISCHARGE MEDICATIONS:  He was told to restart all of home medications,  which include Levoxyl, chlorthalidone, lisinopril, pravastatin, Xanax  and potassium liquid.  He was also given a prescription for Vicodin  5/325 mg 1 to 2 tabs p.o. every 4 hours as needed for pain.   DISCHARGE INSTRUCTIONS:  At this time, the patient was not given any  restrictions on his diet.  He was sent home with home health, who was  informed to remove his staples on Friday, December 5, and then apply  Steri-Strips at this time.  They are also to apply the patient with  supplies needed for his ostomy care.  He is also told that he can  increase his activity slowly, and he may walk up steps.  Once his  staples are removed, the patient is told that he may shower; however, he  is not to bathe.  He is not allowed to lift for at least the next 2  weeks  anything greater than 15 pounds.  He is also told that if he  notices a fever that is greater than 101.5, or any redness or drainage  that is coming from his incision after his staples are removed, he is to  contact the surgery office.   At this time, he is also told to reschedule his oncology appointment  with Dr. Clelia Croft, which he had to cancel due to his hospital stay this  admission.  He is to return to see Dr. Shelle Iron, who is the pulmonologist,  and his appointment is already made for December 23 at 1:15 p.m.  He was  also told to follow up with Dr. Carolynne Edouard with [facility] in 2  to 3 weeks.      Letha Cape, PA    ______________________________  Dorisann Frames, M.D.    KEO/MEDQ  D:  08/11/2007  T:  08/11/2007  Job:  [id number]   cc:   Blenda Nicely. Clelia Croft  Barbaraann Share, MD,FCCP  Bernette Redbird, M.D.  Ollen Gross. Vernell Morgans, M.D.

## 2011-01-20 NOTE — Op Note (Signed)
NAMEUMAIR, [name]               ACCOUNT NO.:  [id number]   MEDICAL RECORD NO.:  [id number]          PATIENT TYPE:  AMB   LOCATION:  SDS                          FACILITY:  [facility]   PHYSICIAN:  Ines Bloomer, M.D. DATE OF BIRTH:  1927-11-11   DATE OF PROCEDURE:  DATE OF DISCHARGE:                               OPERATIVE REPORT   PREOPERATIVE DIAGNOSIS:  Left lower lobe mass, superior segment.   POSTOPERATIVE DIAGNOSIS:  Left lower lobe mass, superior segment.   PROCEDURE:  Fiberoptic bronchoscopy, mediastinoscopy.   SURGEON:  Ines Bloomer, M.D.   ANESTHESIA:  General.   PROCEDURE IN DETAIL:  After general anesthesia, the patient was prepped  and draped in the usual sterile manner.  A transverse incision was made  above the __________ notch.  Dissection was carried down through the  subcutaneous tissue and fascia.  The pretracheal fascia was entered.  Biopsies of a 4R7 and 4O and 2R notes were done.  Strap muscles were  closed with 2-0 Vicryl, subcutaneous tissue with 3-0 Vicryl.  The video  bronchoscope was passed down the endotracheal tube.  Carina was midline,  the left upper lobe, left lower lobe orifices were normal but under  fluoroscopic biopsy for fluoroscopic control, we did brushings and  biopsy in the area of the lesion in the superior segment.  Washings were  also taken.  The right mainstem, right upper lobe, right lower lobe  orifices were normal.  The video bronchoscope was removed.  The patient  was returned to the recovery room in stable condition.      Ines Bloomer, M.D.  Electronically Signed     DPB/MEDQ  D:  09/26/2007  T:  09/26/2007  Job:  [id number]

## 2011-01-20 NOTE — Assessment & Plan Note (Signed)
OFFICE VISIT   [name], [name]  DOB:  1928/05/09                                        Jan 31, 2008  CHART #:  [id number]   HISTORY OF PRESENTING ILLNESS:  [name] is a 77 year old Caucasian  male who is status post left video assisted thoracoscopy, mini  thoracotomy and left lower lobectomy by Dr. Edwyna Shell on 10/09/2007.  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  A  couple of lymph nodes were found to have metastatic neuroendocrine  carcinoma.  The patient was last seen in the office on 12/14/2007.  At  that time he had two rounds of chemotherapy.  He has now finished  chemotherapy.   PHYSICAL EXAMINATION:  General:  This is a pleasant, 77 year old  Caucasian male who is alert, oriented and cooperative, in no acute  distress.  Blood pressure is 108/62.  Pulse rate 112, respiratory rate  18.  O2 saturation 96% on room air.  Cardiovascular:  Tachycardic.  Pulmonary:  Clear to auscultation bilaterally.  No rales, wheezes or  rhonchi.  Extremities:  No cyanosis, clubbing or edema of the lower  extremities.  Left chest wound is well healed.   Patient had a chest x-ray done today, which showed stable postop  changes.  No active infiltrate or effusion seen.  Right hemidiaphragm  was noted to be slightly elevated.  No evidence of recurrence or  metastasis, however.   IMPRESSION/PLAN:  [name] overall is surgically stable.  He has just  finished chemotherapy.  He is going to see Dr. Arbutus Ped in August.  At  that time a CT of the chest will be obtained.  Dr. Edwyna Shell will then see  him after this.   Doree Fudge, PA   DZ/MEDQ  D:  01/31/2008  T:  01/31/2008  Job:  [id number]

## 2011-01-20 NOTE — Discharge Summary (Signed)
[name], [name]               ACCOUNT NO.:  [id number]   MEDICAL RECORD NO.:  [id number]          PATIENT TYPE:  INP   LOCATION:  4738                         FACILITY:  [facility]   PHYSICIAN:  Bernette Redbird, M.D.   DATE OF BIRTH:  1928-04-21   DATE OF ADMISSION:  07/15/2007  DATE OF DISCHARGE:  07/22/2007                               DISCHARGE SUMMARY   DISCHARGE DIAGNOSES:  1. Colonic obstruction due to sigmoid volvulus.  2. Colonic ileus, etiology not determined.  3. Hypokalemia, resolved.  4. Malignant lung mass, superior segment left lower lobe, final      pathology pending.  5. Remote history of sigmoid cancer, status post sigmoid colectomy.  6. History of hypertension.  7. History of hyperlipidemia.  8. History of hypothyroidism.   CONSULTATIONS:  1. Dr. Mikey Bussing Ingram/Dr. Luretha Murphy, general surgery.  2. Dr. Levy Pupa, pulmonary medicine.  3. Dr. Eli Hose, oncology.   PROCEDURES:  1. Colonoscopy with decompression of the colon by Dr. Carman Ching.  2. Chest CT scan.  3. Fine needle biopsy of the lung.  4. Gastrografin enema.   LABORATORY DATA:  Admission white count was 12,900 with hemoglobin of  16, MCV of 89, 312,000 platelets, 89 polys, 6 lymphs.  By a couple of  days into the hospitalization, white count had fallen to 9300,  hemoglobin 14.4.  Prior to discharge, white count was 6000, hemoglobin  14.7.  Admission potassium was 3.1, glucose 168, BUN 51, creatinine 2.0.  With hydration, the potassium fell to 2.8, BUN of 43, creatinine 1.5.  As the hospitalization progressed, potassium normalized, BUN was 14 and  creatinine 0.9.  On the day of discharge, the electrolytes were  essentially normal with sodium 142, potassium 4.0, chloride 102, bicarb  33, glucose 114, BUN 9, creatinine 0.9.  TSH was normal at 3.6.  C.  difficile toxin was negative x2.  Pathology on the lung biopsy showed  malignant cells, for which further specification is in process,  although  there are some neurologic/endocrine features raising the question of a  possible small cell carcinoma.   HOSPITAL COURSE:  Problem 1.  SIGMOID VOLVULUS/COLONIC ILEUS:  The  patient was admitted directly to the hospital after a CT scan obtained  as an outpatient at [facility] showed marked abdominal  distention and radiographic findings suggestive of a sigmoid volvulus.  This is despite the fact that he had a sigmoid colectomy for his colon  cancer approximately 10 years earlier.  He had been seen by me the day  before the CT scan in the office and noted to have obstructive symptoms  with abdominal distention and vomiting, which is what prompted the CT  scan.  Following the decompressive colonoscopy by Dr. Randa Evens, there was  a marked improvement in the patient's abdominal distention and firmness,  although there was some degree of tympani and distention which resolved  over the next few days as we got his potassium level normalized.   Problem 2.  DIARRHEA:  The patient had diarrhea during the course of his  hospitalization.  Some of this may have  been due to his exposure to  antibiotics at the first part of hospitalization.  They had been started  because, during this colonoscopy, there were actually ischemic-looking  changes in focal areas of the colonic mucosa.  With the normalization of  his white count and the improvement in his distention, however, it was  felt safe to discontinue the antibiotics and he never had a problem with  fever or a subsequent rise in his white count.  The diarrhea gradually  improved such that by the day of discharge, he had only had one bowel  movement in the past 16 hours and it was starting to become semi-formed  in character.  Note that his C. difficile negative while in the  hospital.  It is also recalled, however, that there was some low grade  mucosal edema and minimal inflammation seen on colonic biopsies done on  a routine  surveillance colonoscopy about a month prior to this  admission, so that if the diarrhea persists, consideration might have to  be given to using Asacol or similar agents.   Problem 3.  HYPOKALEMIA:  As noted above, the patient's potassium fell  to a nadir of 2.8 and it was 4.0 at the time of discharge, although it  took fairly high potassium doses to keep it in that range.  The  discharge dose of KCl 80 mEq daily is thought to be sufficient even  though it is a little bit less than what he was on while in the  hospital.  As he resumes a regular diet and gets more dietary potassium,  I think that this will be the right dose.  He will be followed up by Dr.  Duaine Dredge, with whom I have discussed the case, in the office in a few  days.   Problem 4.  MALIGNANT LUNG TUMOR:  As noted above, the patient had an  abdominal CT just prior to this at admission which prompted the  admission when a colonic volvulus/ileus was noted.  As an incidental  finding on that exam, however, a lung lesion was noted in the superior  segment of the left lower lobe which had not been present on a CT scan 4  years earlier.  Accordingly, there were concerns of lung cancer and this  prompted a full chest CT which confirmed the lesion, but no other  abnormalities.  This lesion was fine needle biopsied by interventional  radiology and returned malignant cells with neurologic/endocrine  features with final pathology report pending.  The patient was seen in  consultation by  Pulmonary (Dr. Delton Coombes) and based on the positive  cytology, he was thus referred to oncology (Dr. Clelia Croft) who has  requested outpatient followup with the patient.  At that time, depending  on the final pathology results, a decision will be made as to whether  not thoracic surgical consultation is warranted.   DIET:  The patient is discharged home on a regular diet.   FOLLOW UP:  Follow up with Dr. Duaine Dredge in the office in 3 days to  recheck  a potassium level.  Follow up in the office with me in 1 week to  assess his colonic status including diarrhea and distention.  Follow up  with Dr. Wenda Low to help decide whether or not an elective colectomy  is warranted to help prevent a future volvulus problem (the patient to  call for appointment in 2-3 weeks).  Follow up with Dr. Clelia Croft which  will be arranged sometime  in the next couple of weeks for oncology  followup.   DISCHARGE MEDICATIONS:  1. KCL elixir 40 mEq p.o. b.i.d.  2. Levoxyl 112 mcg daily.  3. Chlorthalidone 25 mg daily.  4. Lisinopril 20 mg daily.  5. Pravastatin 40 mg daily.  6. Viagra p.r.n.  7. Xanax 0.5 mg at bedtime, if needed.  Apart from the alteration in      potassium dosing, no new medicines or discontinuation of previous      medications is anticipated as a consequence of this      hospitalization.   CONDITION ON DISCHARGE:  Improved.           ______________________________  Bernette Redbird, M.D.     RB/MEDQ  D:  07/22/2007  T:  07/23/2007  Job:  [id number]   cc:   Mosetta Putt, M.D.  [place] Daphine Deutscher, MD  Leslye Peer, MD  Blenda Nicely [employer]

## 2011-01-20 NOTE — Letter (Signed)
September 15, 2007   Blenda Nicely. Clelia Croft, MD  [facility]  [address]  [place], [place]  [address]   Re:  CAM[place], KNOTEK               DOB:  05-Mar-1928   Dear Dr. Clelia Croft:   I appreciate the opportunity of seeing Mr. Renaldo Fiddler. This 77 year old  patient was recently admitted to the hospital with a sigmoidvolvulus,  requiring a decompression of the volvulus followed by a sigmoid  resection with colostomy by Dr. Carolynne Edouard. A CT scan was done which showed a  1.7 x 3.2 spear segmental lesion. A needle biopsy was done that showed  non-small cell lung cancer with neuroendocrine features. However, on  review  it was not sure that this could be definitely called cancer. A  PET scan was done which revealed that the lesion was positive with  hilar, mediastinal, and subcarinal adenopathy that was also positive. He  is referred here for consideration of resection.   PAST MEDICAL HISTORY:  Significant for colon cancer with a sigmoid  colectomy in 1997. He has hypertension, hyperlipidemia, and  hypothyroidism. He has had a previous colitis and mild renal  insufficiency.   ALLERGIES:  MORPHINE and OXYCONTIN which causes confusion.   MEDICATIONS:  1. Lisinopril 20 mg daily.  2. Pravastatin 80 mg daily.  3. Potassium 10 mg daily.  4. __________  25 mg daily.  5. Levoxyl 112 mcg daily.   FAMILY HISTORY:  Positive for cardiac disease.   SOCIAL HISTORY:  He is married and has retired. He has three children.  He quit smoking many years ago and he does not drink alcohol on a  regular basis.   REVIEW OF SYSTEMS:  GENERAL: He has had some weight loss.  CARDIAC: No angina or atrial fibrillation.  PULMONARY:  He has had no hemoptysis, fever, chills, or asthma.  GI:  See the history of present illness.  GU:  See past medical history. There is no dysuria or frequent  urination.  VASCULAR:  No claudication, DVT, or TIA.  NEUROLOGIC:  No headaches, blackouts , or seizures.  MUSCULOSKELETAL:  No joint pains or rash. There is no muscular pain.  PSYCHIATRIC:  No depression or nervousness.  HEENT:  No change to his eyesight or hearing.  HEMATOLOGIC:  No problems with bleeding or clotting disorders.   PHYSICAL EXAMINATION:  GENERAL:  He is a well developed Caucasian male  in no acute distress.  HEENT:  Unremarkable.  NECK:  Supple without thyromegaly. There is no supraclavicular or  axillary adenopathy.  VITAL SIGNS:  Blood pressure 127/71, pulse 84, respirations 18,  saturations 94%.  CHEST:  Clear to auscultation and percussion.  HEART:  Regular sinus rhythm. There is a 1/6 systolic ejection murmur.  ABDOMEN:  There is a colostomypouch and surgical scars. Bowel sounds are  normal.  EXTREMITIES:  Pulses are 2+. There is no clubbing or edema.  NEUROLOGIC:  He is oriented x3. Sensory and motor are intact. Cranial  nerves are intact.   Pulmonary function test showed an FVC of 2.97 with an FEV1 of 2.12.   With the positive PET scan, I think that he needs a bronchoscopy and  mediastinoscopy. I plan to do this at [facility] on the 19th of  January. I will let you know what our findings are.   Ines Bloomer, M.D.  Electronically Signed   DPB/MEDQ  D:  09/15/2007  T:  09/16/2007  Job:  [id number]

## 2011-01-20 NOTE — Consult Note (Signed)
NAMECHRISTOPHER, Watts NO.:  [id number]   MEDICAL RECORD NO.:  [id number]          PATIENT TYPE:  INP   LOCATION:  3305                         FACILITY:  [facility]   PHYSICIAN:  Lajuana Matte, MD  DATE OF BIRTH:  1928/02/13   DATE OF CONSULTATION:  10/14/2007  DATE OF DISCHARGE:                                 CONSULTATION   REFERRING PHYSICIAN:  Ines Bloomer, M.D.   REASON FOR EVALUATION:  A 77 year old white male diagnosed with lung  cancer.   HISTORY:  [name] is a very pleasant 77 year old white male with past  medical history significant for colon cancer status post sigmoid  colectomy in 1997, history of hypertension, hyperlipidemia who in early  November of 2008 was found to have sigmoid volvulus requiring  decompression, and the patient had a resection with colostomy under the  care of Dr. Carolynne Edouard. During his evaluation, he had CT scan of the abdomen  and pelvis performed on July 15, 2007, and it showed questionable  lung mass in the superior segment of the left lower lobe. This was  followed by CT scan of the chest on July 19, 2007, and it showed a  1.7 x 3.2-cm soft tissue mass in the posterior left lower lobe  concerning for neoplasm. Then, the patient underwent CT-guided biopsy on  July 20, 2007, and the pathology case number [id number] showed poorly  differentiated carcinoma. Then, in September 06, 2007, the patient had a  PET scan performed, and it showed malignant range hypermetabolic uptake  within the left lower lobe pulmonary lesion. There was also  hypermetabolic activity within the mediastinal and bilateral hilar lymph  nodes with SUV ranging from 5.3 to 8.6. On September 26, 2007, the patient  underwent fiberoptic bronchoscopy and mediastinoscopy under the care of  Dr. Edwyna Shell, and the mediastinal lymph nodes from level 7, 4R, 4L, and 2R  were negative for malignancy. On October 09, 2007, the patient underwent  left VATS,  minithoracotomy and left lower lobectomy under the care of  Dr. Edwyna Shell with node dissection. 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 The tumor extended  close to but does not invade the visceral pleura. Lymph nodes from level  11L showed metastatic neuroendocrine carcinoma. Dr. Edwyna Shell kindly asked  me to see the patient today for evaluation and discussion of adjuvant  therapy.   When seen today, the patient was fine. He was recovering from his  surgery well. He denies having any significant complaints at this point  except for mild shortness of breath. On review of systems, he has no  fever or chills. No headache, no blurry vision, no double vision. Has no  significant chest pain. Continues to have mild shortness of breath with  no cough, hemoptysis, syncope or palpitations. No nausea, vomiting. No  abdominal pain, diarrhea, constipation, melena, or hematochezia. No  dysuria or hematuria.   PAST MEDICAL HISTORY:  As mentioned above, significant for:  1. History of colon cancer status post sigmoid colectomy in 1997.  2. Hypertension.  3. Hyperlipidemia.  4. History  of colitis.  5. History of mild renal insufficiency.   FAMILY HISTORY:  Unremarkable for malignancy. Both parents died from  stroke and heart disease.   SOCIAL HISTORY:  He is married, has three children. The patient used to  work in [employer] before. He had no history of recent smoking.  Drinks alcohol occasionally. No history of drug abuse.   ALLERGIES:  HE IS ALLERGIC TO MORPHINE AND OXYCONTIN THAT CAUSE  CONFUSION.   CURRENT HOME MEDICATIONS:  1. Lisinopril.  2. Pravastatin.  3. Levoxyl.   PHYSICAL EXAMINATION:  Today, blood pressure 112/56, pulse 72,  respiratory rate 19, temperature 98.4, oxygen saturation 93% on room  air.  GENERAL EXAM:  Showed a very pleasant, 77 year old, white male, awake,  alert, in no acute distress.  HEENT:  Normocephalic,  atraumatic. Clear oropharynx.  NECK EXAM:  Supple. No lymphadenopathy.  CHEST EXAM:  Clear to auscultation. No wheezes or crackles.  CARDIOVASCULAR EXAM:  Normal S1 and S2. No murmur or gallop.  ABDOMINAL EXAM:  Soft, nontender, nondistended, no masses.  EXTREMITIES:  Showed no edema.   LABORATORY DATA:  CBC and CMET on October 13, 2007 showed white blood  count 7.6, hemoglobin 11.7, hematocrit 34.2, platelets 216. Sodium 133,  potassium 3.9, glucose 121, BUN 10, creatinine 0.73, calcium 8.6.   ASSESSMENT AND PLAN:  This is a very pleasant, 77 year old, white male  diagnosed with end-stage IIB (T2, N1, MX) nonsmall cell lung cancer,  large cell neuroendocrine carcinoma. After long discussion with the  patient and his wife today about his disease stage, prognosis, and  treatment options, I recommend for the patient four cycles of adjuvant  therapy with platinum-based regimen that will be given every three  weeks. I explained to the patient that there was survivor benefit for  adjuvant therapy in stage IIB in the range between 5-10%, and the  patient would like to proceed with this plan. I will see him back for  followup visit at the original cancer center in two weeks for more  detailed discussion of this option. In the meantime, I will complete his  staging workup by ordering MRI of the brain to rule out any metastatic  disease to the brain.   Thank you so much for allowing me to participate in the care of Mr.  [name].      Lajuana Matte, MD  Electronically Signed     MKM/MEDQ  D:  10/14/2007  T:  10/14/2007  Job:  [id number]   cc:   Ines Bloomer, M.D.  Mosetta Putt, M.D.  Bernette Redbird, M.D.  Leslye Peer, MD

## 2011-01-20 NOTE — Assessment & Plan Note (Signed)
OFFICE VISIT   [name], [name]  DOB:  1928/01/20                                        November 09, 2007  CHART #:  [id number]   He comes in today.  His blood pressure is 118/64, pulse 99, respirations  20, sats were 94%.  Incisions were well healed.  Lungs were clear to  auscultation and percussion.   Chest x-ray showed normal postoperative changes.   He is going to start chemotherapy soon by Dr. Arbutus Ped, and I will see  him back again in 4 weeks with a chest x-ray.   Ines Bloomer, M.D.  Electronically Signed   DPB/MEDQ  D:  11/09/2007  T:  11/09/2007  Job:  [id number]

## 2011-01-20 NOTE — Op Note (Signed)
NAMEBAY, [name]               ACCOUNT NO.:  [id number]   MEDICAL RECORD NO.:  [id number]          PATIENT TYPE:  INP   LOCATION:  5123                         FACILITY:  [facility]   PHYSICIAN:  Ollen Gross. Vernell Morgans, M.D. DATE OF BIRTH:  03/17/1928   DATE OF PROCEDURE:  DATE OF DISCHARGE:                               OPERATIVE REPORT   PREOPERATIVE DIAGNOSIS:  Previous colostomy for a sigmoid volvulus.   POSTOPERATIVE DIAGNOSIS:  Previous colostomy for a sigmoid volvulus.   PROCEDURE:  Colostomy takedown, lysis of adhesions, rigid sigmoidoscopy  with EEA 29-mm, stapled anastomosis, mobilization of the splenic  flexure.   SURGEON:  Ollen Gross. Vernell Morgans, MD   ASSISTANT:  Anselm Pancoast. Zachery Dakins, MD   ANESTHESIA:  General endotracheal.   PROCEDURE:  After informed consent was obtained, the patient was brought  to the operating room, placed in supine position on the operating room  table.  After adequate induction of general anesthesia, the patient was  placed in lithotomy position, and the abdomen and perineal area were  prepped with Betadine and draped in usual sterile manner.  A midline  incision was made along the patient's old midline incision with a 10-  blade knife.  This incision was carried down through the skin and  subcutaneous tissue sharply with the electrocautery until the fascia was  identified.  The fascia was also incised with the electrocautery.  The  preperitoneal space was probed bluntly with hemostat until the  peritoneum was opened and access was gained to the abdominal cavity.  The rest of the incision was opened under direct vision with the  electrocautery.  The patient had some adhesions of small bowel to the  anterior abdominal wall, but these were fairly minimal and were taken  down sharply with electrocautery and the Metzenbaum scissors.  This  allowed complete access to the abdominal cavity.  There was some  adhesions of small bowel down on the pelvis which were  also taken down  by sharp dissection with the Metzenbaum scissors.  Once this was  accomplished, we were easily able to pack the small bowel up into the  right upper quadrant.  A Balfour retractor was deployed.  We were able  to identify the rectal stump.  We mobilized the rectal stump by incising  its lateral attachments just slightly, and at this point, the rectal  stump appeared to be healthy and intact.  We then turned our attention  to the colostomy.  The colostomy was dissected away from the abdominal  wall circumferentially by blunt finger dissection and some sharp  dissection with the electrocautery.  Once we were able to bring this  dissection all the way up near the skin, we then excised the skin around  the ostomy with a 15-blade knife.  Then, the incision was then taken  down through the skin and subcutaneous tissue sharply with the  electrocautery until the dissection met the dissection from the inside.  We were then able to free the entire ostomy up circumferentially from  the rest of the abdominal wall and subcutaneous tissue and bring  it back  into the abdominal cavity.  At this point, the left colon and splenic  flexure were mobilized by incising its retroperitoneal attachment along  the white line of T[place]t.  The splenic flexure was then mobilized by some  blunt finger dissection and some sharp dissection with the  electrocautery.  The omentum was adherent in the left upper quadrant,  and the plane between the omentum and transverse colon was taken down  sharply with the electrocautery, and this allowed good mobilization of  the left colon down into the pelvis.  The last few centimeters of the  colostomy were excised by removing the mesentery at this point by  hemostat dissection.  The vessels in this section of mesentery were  clamped with hemostats, divided and ligated with 2-0 silk ties.  Allen  clamp was then placed across the colon, and the colon was divided distal   to the Allen clamp with a 10-blade knife.  The Allen clamp was then  removed.  The colon appeared to be healthy and viable with good blood  supply.  The EEA sizers were used and the appropriate size was a 29-mm  sizer.  Next, a 2-0 Prolene pursestring stitch was placed around the  mucosal and muscular edge of the distal end of the proximal colon.  Once  this pursestring was then placed, the anvil from the sizer was placed  inside the distal end of the proximal colon and the pursestring stitch  was cinched down and tied.  This appeared to be in good position.  At  this point, Dr. Zachery Dakins went down below and brought the EEA stapling  device into the rectum.  The stapling device was positioned at the  rectal stump staple line and appeared to be in good position.  The spike  was then deployed.  The anvil was placed on the spike until it clicked  into place.  Care was taken to make sure there was no twist to the  proximal colon.  The stapling device was then closed into the green zone  and fired thereby creating a nice widely patent anastomosis between the  left colon and rectal stump.  The pelvis was then filled with saline.  The proximal colon was clamped between 2 fingers, and Dr. Zachery Dakins then  performed a rigid sigmoidoscopy.  He see the anastomosis and it appeared  to be complete.  Our donuts were also complete from the anastomosis.  There was no sign of any bubbling into the pelvis during this portion of  the procedure, and the anastomosis looked good.  The rigid sigmoidoscopy  was then terminated.  The abdomen was irrigated with copious amounts of  saline.  The ostomy site was then closed with an inner layer of  interrupted #1 Novofil stitches and then an outer layer again of  interrupted #1 Novofil stitches.  The colostomy defect then appeared to  be well repaired.  The fascia of the anterior abdominal wall was then  closed with two #1 running looped PDS sutures.  The subcutaneous  tissue  at each site was irrigated with copious amounts of saline and Betadine,  and the skin was closed with staples.  Sterile dressings and Betadine  ointment were applied.  The patient tolerated the procedure well.  At  the end of the case, all needles, sponge, and instrument counts were  correct.  The patient was then awakened and taken to recovery room in  stable condition.      Renae Fickle  Lovette Cliche, M.D.  Electronically Signed     PST/MEDQ  D:  07/17/2008  T:  07/17/2008  Job:  [id number]

## 2011-01-20 NOTE — Consult Note (Signed)
[name], [name]               ACCOUNT NO.:  [id number]   MEDICAL RECORD NO.:  [id number]          PATIENT TYPE:  INP   LOCATION:  4738                         FACILITY:  [facility]   PHYSICIAN:  Firas N. Shadad        DATE OF BIRTH:  1928-05-29   DATE OF CONSULTATION:  07/21/2007  DATE OF DISCHARGE:                                 CONSULTATION   REASON FOR CONSULTATION:  Lung lesion.   REFERRING PHYSICIAN:  Dr. Randa Evens.   HISTORY OF PRESENT ILLNESS:  [name] is a pleasant 77 year old white  male with a history of colon cancer, status post sigmoid colectomy in  1997, not requiring chemotherapy or radiation.  He was admitted on  July 15, 2007 with nausea, vomiting, abdominal distention and  diarrhea, found to have colonic volvulus requiring decompression.  Workup included a CT of the abdomen, revealing an incidental 3 cm left  lower lobe mass.  A followup CT of the chest, without contrast, showed  this mass to be 1.7 x 3.2 cm in the superior segment of the left lower  lobe, solitary, with no hilar or mediastinal lymphadenopathy.  He  underwent CT-guided biopsy of the left lower lobe mass, with fine needle  aspiration and core biopsy on July 20, 2007 by intervention  radiology.  The pathology, preliminary report from the fine needle  aspiration, is consistent with 4-lead differentiated carcinoma, narrow  endocrine ties.  The immunohistochemical stains are currently pending.  This is important, in order to determine if there is any relation to the  previous colon cancer versus no primary.  Based on these findings, we  were asked to see the patient, with recommendations regarding his care.   PAST MEDICAL HISTORY:  1. History of colon cancer, status post sigmoid colectomy in 1997, as      above.  2. Hypertension.  3. Hyperlipidemia.  4. Hypothyroidism.  5. Chronic mucosal colitis, per colonoscopy report.  6. Colonic volvulus in this admission.  7. Mild renal insufficiency,  on admission, now resolved.   PAST SURGICAL HISTORY:  1. Status post sigmoidoscopy - decompression of the volvulus on      July 15, 2007, Dr. Randa Evens.  2. Status post sigmoidectomy in 1997.   ALLERGIES:  1. OXYCONTIN.  2. MORPHINE SULFATE.   CURRENT MEDICATIONS:  1. Lovenox on hold.  2. Flora-Q.  3. Synthroid 112 mcg daily.  4. Prinivil 10 mg daily.  5. Protonix 40 mg daily.  6. KCL 10 mEq q.i.d.  7. Zocor 20 mg q.h.s.  8. Xanax 0.5 mg q.8 hours p.r.n.  9. Zofran p.r.n.   REVIEW OF SYSTEMS:  Remarkable for fatigue and an 11 pound weight loss  over the last 4 weeks, as well as diarrhea on admission, which is now  improved, but not resolved.  He denied any blood in the stools at the  time or gross hematuria.  He has had abdominal pain, however, was  intense on arrival, now is better controlled.  Other complaints include  chronic right shoulder pain.  The rest of the review of systems is  negative.   FAMILY HISTORY:  Noncontributory for colon cancer, although he has 1 son  with a history of colon polyps.   SOCIAL HISTORY:  The patient is divorced, has 1 son, and is retired.  No  alcohol or tobacco history.  Lives in [place].  He is [employer].  Last  colonoscopy was on July 15, 2007, he had a prior one, which was  nondiagnostic on June 23, 2007.   PHYSICAL EXAMINATION:  GENERAL:  This is well-developed, 77 year old  white male in no acute distress, alert and oriented x3.  VITAL SIGNS:  Blood pressure 119/65.  Pulse 74.  Respirations 20.  Temperature 97.8.  Pulse oximetry 98% on room air.  HEENT:  Normocephalic, atraumatic.  PERRLA.  No mucosa, oral thrush or  lesions.  NECK:  Supple.  No cervical or supraclavicular masses.  LUNGS:  Essentially clear to auscultation bilaterally.  No axillary  masses.  CARDIOVASCULAR:  Regular rate and rhythm with a 1/6 systolic murmur.  No  rubs or gallops.  ABDOMEN:  Soft, nontender.  Bowel sounds x4.  No palpable spleen or   liver.  GU/RECTAL:  Deferred.  EXTREMITIES:  With no clubbing or cyanosis, no edema.  SKIN:  Without lesions, bruising or petechiae rash.  NEURO:  Nonfocal.   LABORATORY DATA:  Hemoglobin 14.7, hematocrit 43.8, white count 6.0,  platelets 255, MCV 89.8.  PT 14.2, INR 1.1.  TSH 3.588.  Sodium 139,  potassium 2.7 although this is being replenished, BUN 8, creatinine 0.89  was 2.04 on admission, glucose 95.  As of July 15, 2007, his total  bilirubin was 1.3, alkaline phosphatase 54, AST 39, ALT 29, total  protein 7.4, albumin 3.8, calcium 9.8.  C. difficile toxin was negative.   RADIOLOGICAL STUDIES:  See HPI.   ASSESSMENT/PLAN:  We were asked to see this 77 year old white male, with  a incidental lung mass in the left lower lobe, in the setting of a  history of colon cancer, status post resection in 1997, not requiring  chemotherapy or radiation at the time.  He underwent a fine needle  aspiration biopsy of this mass, with a preliminary report positive for  poorly differentiated carcinoma, neuroendocrine type.  The  immunohistochemical stains are currently pending.  The differential  included lung primary versus metastatic colon cancer.  It would be  recommended, that the patient while in the hospital, be evaluated by Dr.  Edwyna Shell or one of his partners for possible resection of this so tiny  lung  mass, given that no other mediastinal or hilar lymphadenopathy has been  seen.  Dr. Cheryle Horsfall on the formal pathology reports, is to give  further recommendations regarding his care.  Thank you very much for  allowing [place] the opportunity to participate in the care of this nice  patient, we will follow with you.      [name], P.A.      [name]. [facility]  Electronically Signed    SW/MEDQ  D:  07/21/2007  T:  07/21/2007  Job:  [id number]   cc:   Mosetta Putt, M.D.  Bernette Redbird, M.D.  Leslye Peer, MD

## 2011-01-23 ENCOUNTER — Other Ambulatory Visit: Payer: Self-pay | Admitting: Internal Medicine

## 2011-01-23 ENCOUNTER — Encounter (HOSPITAL_BASED_OUTPATIENT_CLINIC_OR_DEPARTMENT_OTHER): Payer: Medicare Other | Admitting: Internal Medicine

## 2011-01-23 DIAGNOSIS — C3490 Malignant neoplasm of unspecified part of unspecified bronchus or lung: Secondary | ICD-10-CM

## 2011-01-23 LAB — CBC WITH DIFFERENTIAL/PLATELET
Basophils Absolute: 0 10*3/uL (ref 0.0–0.1)
Eosinophils Absolute: 0.1 10*3/uL (ref 0.0–0.5)
HCT: 41.6 % (ref 38.4–49.9)
HGB: 13.7 g/dL (ref 13.0–17.1)
MCH: 29.1 pg (ref 27.2–33.4)
MCV: 88.5 fL (ref 79.3–98.0)
MONO%: 7.5 % (ref 0.0–14.0)
NEUT#: 4 10*3/uL (ref 1.5–6.5)
NEUT%: 64.6 % (ref 39.0–75.0)
RDW: 13.6 % (ref 11.0–14.6)
lymph#: 1.6 10*3/uL (ref 0.9–3.3)

## 2011-01-23 LAB — COMPREHENSIVE METABOLIC PANEL
Albumin: 4.1 g/dL (ref 3.5–5.2)
BUN: 19 mg/dL (ref 6–23)
Calcium: 9.1 mg/dL (ref 8.4–10.5)
Chloride: 99 mEq/L (ref 96–112)
Creatinine, Ser: 1.11 mg/dL (ref 0.40–1.50)
Glucose, Bld: 118 mg/dL — ABNORMAL HIGH (ref 70–99)
Potassium: 3.9 mEq/L (ref 3.5–5.3)

## 2011-01-23 NOTE — Op Note (Signed)
[facility]  Patient:    [name], [name]                      MRN: [id number] Proc. Date: 11/17/00 Adm. Date:  [id number] Attending:  Rich Brave CC:         [name], M.D.   Operative Report  PROCEDURE:  Colonoscopy with polypectomy and biopsies.  INDICATION:  A 77 year old gentleman now approximately four years status post sigmoid colectomy for stage II colon cancer, for surveillance colonoscopy. His most recent surveillance exam, three years ago, showed a small colonic adenoma.  FINDINGS:  Several small to medium-size polyps, removed.  DESCRIPTION OF PROCEDURE:  The nature, purpose, and risks of the procedure were familiar to the patient, who provided written consent.  Sedation was fentanyl 40 mcg and Versed 4 mg IV without arrhythmias or desaturation.  Digital exam was inhibited by a rather deep gluteal cleft preventing full palpation of the prostate, but the inferior margin of it felt normal.  The Olympus adult video colonoscope was inserted and advanced without too much difficulty, turning the patient into the supine position, and reaching the cecum as identified by visualization of the appendiceal orifice.  Pullback was then performed.  The quality of prep was excellent, and it is felt that all areas were well-seen.  On the way in, I encountered a 7 mm semipedunculated polyp, removed by snare technique at about 50 or 60 cm of insertion, with complete hemostasis and no evidence of excessive cautery.  The polyp could not be suctioned through the scope because it was too large, so it was withdrawn out through the rectum and the patient was then re-colonoscoped as described above.  Pullback was then performed as mentioned, and we encountered another, smaller semipedunculated polyp slightly proximal, I believe, to the region of the previous polypectomy. Snare technique was used again for removal of that polyp, again with  complete hemostasis and no evidence of excessive cautery.  That was at about 60 cm. That polyp was able to be suctioned through the scope.  I encountered a small sessile polyp at about 15 cm and another at about 6 cm, each hyperplastic in appearance and removed by one or two cold biopsies.  The anastomosis was identified in the distal colonic region and appeared fairly smooth and without any evidence of local recurrence of cancer.  I did not biopsy the slight bumpiness there as is typically seen, since the overlying mucosa appeared quite normal.  The anastomosis was widely patent.  Retroflexion in the rectum was unremarkable; a small rectal polyp at about 6 cm was removed by cold biopsy as described above.  No vascular malformations, colitis, or diverticular disease were observed.  The patient tolerated this procedure well, and there were no apparent complications.  IMPRESSION:  Multiple colon polyps removed as described above, status post sigmoid colectomy for colon cancer.  PLAN:  Await pathology.  Anticipate follow-up colonoscopy three years. DD:  11/17/00 TD:  11/17/00 Job: [id number]

## 2011-01-23 NOTE — Discharge Summary (Signed)
NAMEARZELL, MCGEEHAN NO.:  [id number]   MEDICAL RECORD NO.:  [id number]          PATIENT TYPE:  OUT   LOCATION:  XRAY                         [facility]ACILITY:  [facility]   PHYSICIAN:  Ollen Gross. Vernell Morgans, M.D. DATE OF BIRTH:  [birth year]   DATE OF ADMISSION:  07/17/2008  DATE OF DISCHARGE:  07/22/2008                               DISCHARGE SUMMARY   Mr. Crehan is a 77 year old gentleman, who was brought to the operating  room on July 17, 2008 for a colostomy take down.  He tolerated this operation well.  He was on the enteric protocol.  On  postop day #1, he was allowed sips of clears.  On postop day #2, his  Foley was discontinued.  On postop day #3, he appeared to be having a  little bit of an ileus.  We added Reglan to his regimen.  On postop day  #4, we were able to advance his diet and stop his PCA.  On postop day  #5, he was tolerating his diet.  His pain was under good control.  He  was ambulating without difficulty and he was ready for discharge home.   MEDICATIONS:  He is to resume his home medications.  He was given a  prescription for pain medicine.  Diet as tolerated.   ACTIVITIES:  No heavy lifting.   FINAL DIAGNOSIS:  Colostomy take down and follow up with Dr. Carolynne Edouard in a  week for staple removal and he is discharged home.      Ollen Gross. Vernell Morgans, M.D.  Electronically Signed     PST/MEDQ  D:  09/13/2008  T:  09/13/2008  Job:  [id number]

## 2011-01-26 ENCOUNTER — Encounter (HOSPITAL_COMMUNITY): Payer: Self-pay

## 2011-01-26 ENCOUNTER — Ambulatory Visit (HOSPITAL_COMMUNITY)
Admission: RE | Admit: 2011-01-26 | Discharge: 2011-01-26 | Disposition: A | Discharge status: Home / Self Care | Payer: Medicare Other | Source: Ambulatory Visit | Attending: Internal Medicine | Admitting: Internal Medicine

## 2011-01-26 DIAGNOSIS — I2510 Atherosclerotic heart disease of native coronary artery without angina pectoris: Secondary | ICD-10-CM | POA: Insufficient documentation

## 2011-01-26 DIAGNOSIS — D3500 Benign neoplasm of unspecified adrenal gland: Secondary | ICD-10-CM | POA: Insufficient documentation

## 2011-01-26 DIAGNOSIS — Z902 Acquired absence of lung [part of]: Secondary | ICD-10-CM | POA: Insufficient documentation

## 2011-01-26 DIAGNOSIS — C3490 Malignant neoplasm of unspecified part of unspecified bronchus or lung: Secondary | ICD-10-CM

## 2011-01-26 HISTORY — DX: Malignant (primary) neoplasm, unspecified: C80.1

## 2011-01-26 MED ORDER — IOHEXOL 300 MG/ML  SOLN
80.0000 mL | Freq: Once | INTRAMUSCULAR | Status: AC | PRN
  Administered 2011-01-26: 80 mL via INTRAVENOUS

## 2011-01-28 ENCOUNTER — Other Ambulatory Visit: Payer: Self-pay | Admitting: Internal Medicine

## 2011-01-28 ENCOUNTER — Encounter (HOSPITAL_BASED_OUTPATIENT_CLINIC_OR_DEPARTMENT_OTHER): Payer: Medicare Other | Admitting: Internal Medicine

## 2011-01-28 DIAGNOSIS — C3430 Malignant neoplasm of lower lobe, unspecified bronchus or lung: Secondary | ICD-10-CM

## 2011-01-28 DIAGNOSIS — C3490 Malignant neoplasm of unspecified part of unspecified bronchus or lung: Secondary | ICD-10-CM

## 2011-04-11 ENCOUNTER — Emergency Department (HOSPITAL_COMMUNITY): Payer: Medicare Other

## 2011-04-11 ENCOUNTER — Inpatient Hospital Stay (HOSPITAL_COMMUNITY)
Admission: EM | Admit: 2011-04-11 | Discharge: 2011-04-21 | DRG: 337 | Disposition: A | Discharge status: Home / Self Care | Payer: Medicare Other | Attending: General Surgery | Admitting: General Surgery

## 2011-04-11 DIAGNOSIS — I10 Essential (primary) hypertension: Secondary | ICD-10-CM | POA: Diagnosis present

## 2011-04-11 DIAGNOSIS — K565 Intestinal adhesions [bands], unspecified as to partial versus complete obstruction: Principal | ICD-10-CM | POA: Diagnosis present

## 2011-04-11 DIAGNOSIS — E039 Hypothyroidism, unspecified: Secondary | ICD-10-CM | POA: Diagnosis present

## 2011-04-11 DIAGNOSIS — Z85118 Personal history of other malignant neoplasm of bronchus and lung: Secondary | ICD-10-CM

## 2011-04-11 DIAGNOSIS — Z85038 Personal history of other malignant neoplasm of large intestine: Secondary | ICD-10-CM

## 2011-04-11 DIAGNOSIS — K56609 Unspecified intestinal obstruction, unspecified as to partial versus complete obstruction: Secondary | ICD-10-CM

## 2011-04-11 DIAGNOSIS — R109 Unspecified abdominal pain: Secondary | ICD-10-CM

## 2011-04-11 DIAGNOSIS — K5900 Constipation, unspecified: Secondary | ICD-10-CM | POA: Diagnosis present

## 2011-04-11 DIAGNOSIS — I4891 Unspecified atrial fibrillation: Secondary | ICD-10-CM | POA: Diagnosis not present

## 2011-04-11 LAB — DIFFERENTIAL
Basophils Absolute: 0 K/uL (ref 0.0–0.1)
Basophils Relative: 0 % (ref 0–1)
Eosinophils Absolute: 0 K/uL (ref 0.0–0.7)
Eosinophils Relative: 0 % (ref 0–5)
Lymphocytes Relative: 12 % (ref 12–46)
Lymphs Abs: 1.4 K/uL (ref 0.7–4.0)
Monocytes Absolute: 0.9 K/uL (ref 0.1–1.0)
Monocytes Relative: 8 % (ref 3–12)
Neutro Abs: 9.6 K/uL — ABNORMAL HIGH (ref 1.7–7.7)
Neutrophils Relative %: 81 % — ABNORMAL HIGH (ref 43–77)

## 2011-04-11 LAB — CBC
HCT: 44.6 % (ref 39.0–52.0)
Hemoglobin: 15.6 g/dL (ref 13.0–17.0)
RBC: 5.05 MIL/uL (ref 4.22–5.81)
RDW: 13.4 % (ref 11.5–15.5)
WBC: 11.9 10*3/uL — ABNORMAL HIGH (ref 4.0–10.5)

## 2011-04-11 LAB — URINALYSIS, ROUTINE W REFLEX MICROSCOPIC
Glucose, UA: NEGATIVE mg/dL
Hgb urine dipstick: NEGATIVE
Specific Gravity, Urine: 1.022 (ref 1.005–1.030)
Urobilinogen, UA: 2 mg/dL — ABNORMAL HIGH (ref 0.0–1.0)
pH: 8 (ref 5.0–8.0)

## 2011-04-11 LAB — URINE MICROSCOPIC-ADD ON

## 2011-04-11 LAB — COMPREHENSIVE METABOLIC PANEL
AST: 15 U/L (ref 0–37)
Albumin: 4.1 g/dL (ref 3.5–5.2)
Chloride: 94 mEq/L — ABNORMAL LOW (ref 96–112)
Creatinine, Ser: 0.97 mg/dL (ref 0.50–1.35)
Sodium: 140 mEq/L (ref 135–145)
Total Bilirubin: 0.8 mg/dL (ref 0.3–1.2)

## 2011-04-11 LAB — LIPASE, BLOOD: Lipase: 16 U/L (ref 11–59)

## 2011-04-11 MED ORDER — IOHEXOL 300 MG/ML  SOLN
100.0000 mL | Freq: Once | INTRAMUSCULAR | Status: AC | PRN
  Administered 2011-04-11: 100 mL via INTRAVENOUS

## 2011-04-12 ENCOUNTER — Inpatient Hospital Stay (HOSPITAL_COMMUNITY): Payer: Medicare Other

## 2011-04-12 LAB — CBC
Platelets: 194 10*3/uL (ref 150–400)
RBC: 4.71 MIL/uL (ref 4.22–5.81)
RDW: 13.5 % (ref 11.5–15.5)
WBC: 10.6 10*3/uL — ABNORMAL HIGH (ref 4.0–10.5)

## 2011-04-12 LAB — BASIC METABOLIC PANEL
Chloride: 97 mEq/L (ref 96–112)
Creatinine, Ser: 0.94 mg/dL (ref 0.50–1.35)
GFR calc Af Amer: >60 mL/min (ref >60)
Potassium: 4 mEq/L (ref 3.5–5.1)
Sodium: 141 mEq/L (ref 135–145)

## 2011-04-12 LAB — URINE CULTURE
Colony Count: 9000
Culture  Setup Time: 201208042115

## 2011-04-12 LAB — DIFFERENTIAL
Basophils Absolute: 0 10*3/uL (ref 0.0–0.1)
Basophils Relative: 0 % (ref 0–1)
Lymphocytes Relative: 8 % — ABNORMAL LOW (ref 12–46)
Neutro Abs: 8.7 10*3/uL — ABNORMAL HIGH (ref 1.7–7.7)
Neutrophils Relative %: 81 % — ABNORMAL HIGH (ref 43–77)

## 2011-04-13 ENCOUNTER — Inpatient Hospital Stay (HOSPITAL_COMMUNITY): Payer: Medicare Other

## 2011-04-14 ENCOUNTER — Inpatient Hospital Stay (HOSPITAL_COMMUNITY): Payer: Medicare Other

## 2011-04-15 ENCOUNTER — Inpatient Hospital Stay (HOSPITAL_COMMUNITY): Payer: Medicare Other

## 2011-04-15 DIAGNOSIS — K565 Intestinal adhesions [bands], unspecified as to partial versus complete obstruction: Secondary | ICD-10-CM

## 2011-04-15 LAB — DIFFERENTIAL
Basophils Relative: 0 % (ref 0–1)
Eosinophils Absolute: 0 10*3/uL (ref 0.0–0.7)
Monocytes Absolute: 0.8 10*3/uL (ref 0.1–1.0)
Monocytes Relative: 6 % (ref 3–12)
Neutrophils Relative %: 87 % — ABNORMAL HIGH (ref 43–77)

## 2011-04-15 LAB — CBC
MCH: 29.4 pg (ref 26.0–34.0)
MCH: 29.6 pg (ref 26.0–34.0)
MCHC: 32.5 g/dL (ref 30.0–36.0)
MCHC: 32.8 g/dL (ref 30.0–36.0)
MCV: 90.6 fL (ref 78.0–100.0)
Platelets: 175 10*3/uL (ref 150–400)
Platelets: 195 10*3/uL (ref 150–400)
RBC: 4.66 MIL/uL (ref 4.22–5.81)
RBC: 5.13 MIL/uL (ref 4.22–5.81)

## 2011-04-15 LAB — BASIC METABOLIC PANEL
CO2: 35 mEq/L — ABNORMAL HIGH (ref 19–32)
Calcium: 9.4 mg/dL (ref 8.4–10.5)
Calcium: 8.8 mg/dL (ref 8.4–10.5)
Creatinine, Ser: 0.75 mg/dL (ref 0.50–1.35)
GFR calc non Af Amer: >60 mL/min (ref >60)
Sodium: 141 mEq/L (ref 135–145)

## 2011-04-15 LAB — CARDIAC PANEL(CRET KIN+CKTOT+MB+TROPI)
CK, MB: 3 ng/mL (ref 0.3–4.0)
Total CK: 44 U/L (ref 7–232)

## 2011-04-15 NOTE — H&P (Signed)
  NAMEBREKKEN, BEACH NO.:  [id number]  MEDICAL RECORD NO.:  [id number]  LOCATION:  5124                         FACILITY:  [facility]  PHYSICIAN:  Ollen Gross. Vernell Morgans, M.D. DATE OF BIRTH:  [birth year]  DATE OF ADMISSION:  04/11/2011 DATE OF DISCHARGE:                             HISTORY & PHYSICAL   Mr. [name] is an 77 year old white male who presents to the emergency department with a 2-day history of not having had a bowel movement.  He denies any real abdominal pain.  He started getting nauseated and vomited last night and vomited again today.  He denies any chest pain or shortness of breath.  No fevers or chills.  No diarrhea or dysuria.  PAST MEDICAL HISTORY:  Significant for colon cancer, lung cancer, hypertension, hypothyroidism.  PAST SURGICAL HISTORY:  Significant for sigmoid colectomy and left lower lobe lobectomy of the lung.  MEDICATIONS:  Include I believe lisinopril, pravastatin, and Levoxyl.  ALLERGIES:  No known drug allergies.  SOCIAL HISTORY:  Denies use of alcohol or tobacco products.  FAMILY HISTORY:  Noncontributory.  Please note his meds were obtained from old discharge summaries, he does not remember what meds he is on and the family is going to check.  PHYSICAL EXAMINATION:  VITAL SIGNS:  His temperature is 98.5, pulse of 85, blood pressure 129/79. GENERAL:  He is a well-developed, well-nourished elderly white male, in no acute distress. SKIN:  Warm and dry.  No jaundice. HEENT:  Eyes, his extraocular movements intact.  Pupils equal, round, and reactive to light.  Sclerae nonicteric. LUNGS:  Clear bilaterally.  No use of accessory respiratory muscles. HEART:  Regular rate and rhythm with impulse in the left chest.  He has got a systolic murmur. ABDOMEN:  Soft and nontender.  He has some mild distention.  He has a very soft reducible ventral hernia above the umbilicus. EXTREMITIES:  No cyanosis, clubbing, or edema.  Good strength in  arms and legs. PSYCHOLOGIC:  He is alert and oriented x3 with no evidence today of anxiety or depression.  On review of his lab work, it is significant for white count of 11.9, his creatinine is 0.9.  CT scan was obtained that showed small bowel obstruction.  ASSESSMENT AND PLAN:  This is an 77 year old white male with what appears to be small bowel obstruction, most likely probably secondary to adhesions.  Although his abdomen is quite soft and nontender, at this point we will admit him for IV hydration, NG tube, and bowel rest.  We will recheck his physical exam and abdominal x-rays frequently.  If he improves, then he may be able to avoid surgery.  If he does not improve over the next 24-48 hours, then he may require exploration.  I have discussed this with him and his family and they are in agreement with this treatment plan.     Ollen Gross. Vernell Morgans, M.D.     PST/MEDQ  D:  04/11/2011  T:  04/11/2011  Job:  [id number]  Electronically Signed by Chevis Pretty III M.D. on 04/15/2011 08:33:25 AM

## 2011-04-16 LAB — CBC
MCH: 29.6 pg (ref 26.0–34.0)
MCV: 89.5 fL (ref 78.0–100.0)
Platelets: 197 10*3/uL (ref 150–400)
RBC: 4.59 MIL/uL (ref 4.22–5.81)
RDW: 13.6 % (ref 11.5–15.5)

## 2011-04-16 LAB — DIFFERENTIAL
Eosinophils Absolute: 0.2 10*3/uL (ref 0.0–0.7)
Eosinophils Relative: 3 % (ref 0–5)
Lymphs Abs: 0.9 10*3/uL (ref 0.7–4.0)
Monocytes Relative: 13 % — ABNORMAL HIGH (ref 3–12)

## 2011-04-16 LAB — BASIC METABOLIC PANEL
CO2: 33 mEq/L — ABNORMAL HIGH (ref 19–32)
Calcium: 8.4 mg/dL (ref 8.4–10.5)
Creatinine, Ser: 0.76 mg/dL (ref 0.50–1.35)

## 2011-04-17 LAB — CBC
MCH: 29.5 pg (ref 26.0–34.0)
MCHC: 33.2 g/dL (ref 30.0–36.0)
Platelets: 171 10*3/uL (ref 150–400)
RBC: 4.2 MIL/uL — ABNORMAL LOW (ref 4.22–5.81)
RDW: 13.3 % (ref 11.5–15.5)

## 2011-04-17 LAB — DIFFERENTIAL
Basophils Relative: 0 % (ref 0–1)
Eosinophils Absolute: 0.3 10*3/uL (ref 0.0–0.7)
Eosinophils Relative: 4 % (ref 0–5)
Monocytes Relative: 9 % (ref 3–12)
Neutrophils Relative %: 79 % — ABNORMAL HIGH (ref 43–77)

## 2011-04-17 LAB — BASIC METABOLIC PANEL
CO2: 32 mEq/L (ref 19–32)
Calcium: 8.3 mg/dL — ABNORMAL LOW (ref 8.4–10.5)
GFR calc Af Amer: >60 mL/min (ref >60)
GFR calc non Af Amer: >60 mL/min (ref >60)
Sodium: 138 mEq/L (ref 135–145)

## 2011-04-18 NOTE — Op Note (Signed)
NAMEBURRIS, MATHERNE NO.:  [id number]  MEDICAL RECORD NO.:  [id number]  LOCATION:  3301                         FACILITY:  [facility]  PHYSICIAN:  Cherylynn Ridges, M.D.    DATE OF BIRTH:  1927-11-19  DATE OF PROCEDURE:  04/15/2011 DATE OF DISCHARGE:                              OPERATIVE REPORT   PREOPERATIVE DIAGNOSIS:  Small bowel obstruction.  POSTOPERATIVE DIAGNOSIS:  Small bowel obstruction.  PROCEDURE: 1. Exploratory laparotomy. 2. Enterolysis.  SURGEON:  Cherylynn Ridges, MD  ASSISTANT:  Letha Cape, PA  ANESTHESIA:  General endotracheal.  ESTIMATED BLOOD LOSS:  100 mL.  COMPLICATIONS:  None.  CONDITION:  Stable.  FINDINGS:  The patient had a very thick adhesive band in the upper abdomen region from the terminal ileum to the proximal bowel which was likely the one causing obstruction.  He had a central area of multiply adhesed small bowel to each other but this was not obstructed and easily allowed the passage of fluid and gas. ADDITIONAL FINDINGS:  The patient had about a 4-5 cm upper incisional ventral hernia which was repaired with the closure.  OPERATION:  The patient was taken to the operating room and placed on the table in supine position.  After an adequate general and endotracheal anesthetic was administered, he was prepped and draped in usual sterile manner exposing the midline of the abdomen.  After a proper time-out was performed identifying the patient and the procedure to be performed, a midline incision was made from the upper part of his previous incision down to the lower portion of the abdomen. We took it down to and through the midline fascia using electrocautery and once we had made an entry into the  peritoneal cavity, the surgeon's finger was placed right directly underneath the midline incision on the fascia.  We opened along the surgeon's finger making sure not to injure an obviously dilated bowel in the abdomen.  As  we extended the incision superiorly or cranially, we ran into a very thick adhesive band which ultimately was found to connect the terminal ileum to the proximal small bowel.  This was likely the one causing the obstruction.  We snapped this band and then we began to go ahead and open the abdomen all the way up to above where the ventral hernia was.  We came across the sac which we ultimately opened up and cleared creating flaps above the fascia in order to provide closure.  We ran the small bowel from the terminal ileum which we were able to find up to the ligament of Treitz.  It was markedly dilated in the mid ileum where the large adhesed band was noted to be tethering.  We milked the contents back up to and through the ligament of Treitz into the stomach where it was aspirated and then taken out of the stomach by the anesthesiologist.  We placed a 16-French NG tube with an 18-French NG tube and able to decompress more.  No injury to the bowel was made, no enterotomies, no serosal tears.  After we milked out a significant amount of contents and no further obstructive adhesions were noted, we irrigated about  a liter of saline solution.  We closed using running looped #1 PDS with interrupted figure- of-eight #1 Novafil internal retention sutures.  We irrigated the subcu with saline and closed the skin using stainless steel staples.  All needle counts, sponge counts and instrument counts were correct.  The upper portion of the abdomen contained the ventral incisional hernia which we did repair using a looped #1 PDS with interrupted internal figure-of-eight Novafils.  Sterile dressing was applied including Betadine ointment, 4x4s and Hypafix.  We will place a binder on the patient.     Cherylynn Ridges, M.D.     JOW/MEDQ  D:  04/15/2011  T:  04/16/2011  Job:  [id number]  Electronically Signed by Jimmye Norman M.D. on 04/18/2011 09:50:21 PM

## 2011-04-19 LAB — BASIC METABOLIC PANEL
CO2: 33 mEq/L — ABNORMAL HIGH (ref 19–32)
Chloride: 103 mEq/L (ref 96–112)
GFR calc Af Amer: >60 mL/min (ref >60)
Sodium: 141 mEq/L (ref 135–145)

## 2011-04-19 LAB — CBC
Platelets: 215 10*3/uL (ref 150–400)
RBC: 3.97 MIL/uL — ABNORMAL LOW (ref 4.22–5.81)
RDW: 13.3 % (ref 11.5–15.5)
WBC: 5.8 10*3/uL (ref 4.0–10.5)

## 2011-04-28 ENCOUNTER — Encounter (INDEPENDENT_AMBULATORY_CARE_PROVIDER_SITE_OTHER): Payer: Self-pay

## 2011-04-28 ENCOUNTER — Encounter (INDEPENDENT_AMBULATORY_CARE_PROVIDER_SITE_OTHER): Payer: Self-pay | Admitting: General Surgery

## 2011-04-28 ENCOUNTER — Ambulatory Visit (INDEPENDENT_AMBULATORY_CARE_PROVIDER_SITE_OTHER): Payer: Medicare Other | Admitting: General Surgery

## 2011-04-28 VITALS — Wt 192.8 lb

## 2011-04-28 DIAGNOSIS — Z09 Encounter for follow-up examination after completed treatment for conditions other than malignant neoplasm: Secondary | ICD-10-CM | POA: Insufficient documentation

## 2011-04-28 NOTE — Progress Notes (Signed)
HPI The patient is status post exploratory laparotomy ventral hernia repair and enteroclysis. He is doing well although the wife reports that he has a very poor appetite. His weight which is known about 218-220 pounds Dr. 192 pounds. He does not look very god although he does state that his appetite is poor.  PE Wound is healed well no infection staples are in place and to be removed today  Studiy review There are no studies to review today.  Assessment Doing well status post enteroclysis an exploratory laparotomy for small bowel obstruction with ventral hernia repair.  Although his appetite is now what should be I do think that he is doing well and will increase over time to  Plan I've recommended to the wife that she start him on for more frequent smaller meals about 6-8 times a day. Also recommended that she supplement his diet with Ensure for calories and protein. I will see him back here in about 3 weeks.

## 2011-05-02 ENCOUNTER — Emergency Department (HOSPITAL_COMMUNITY)
Admission: EM | Admit: 2011-05-02 | Discharge: 2011-05-02 | Disposition: A | Discharge status: Home / Self Care | Payer: Medicare Other | Attending: Emergency Medicine | Admitting: Emergency Medicine

## 2011-05-02 ENCOUNTER — Emergency Department (HOSPITAL_COMMUNITY): Payer: Medicare Other

## 2011-05-02 DIAGNOSIS — Z85038 Personal history of other malignant neoplasm of large intestine: Secondary | ICD-10-CM | POA: Insufficient documentation

## 2011-05-02 DIAGNOSIS — Y92009 Unspecified place in unspecified non-institutional (private) residence as the place of occurrence of the external cause: Secondary | ICD-10-CM | POA: Insufficient documentation

## 2011-05-02 DIAGNOSIS — E039 Hypothyroidism, unspecified: Secondary | ICD-10-CM | POA: Insufficient documentation

## 2011-05-02 DIAGNOSIS — I10 Essential (primary) hypertension: Secondary | ICD-10-CM | POA: Insufficient documentation

## 2011-05-02 DIAGNOSIS — S42109A Fracture of unspecified part of scapula, unspecified shoulder, initial encounter for closed fracture: Secondary | ICD-10-CM | POA: Insufficient documentation

## 2011-05-02 DIAGNOSIS — Z85118 Personal history of other malignant neoplasm of bronchus and lung: Secondary | ICD-10-CM | POA: Insufficient documentation

## 2011-05-02 DIAGNOSIS — W010XXA Fall on same level from slipping, tripping and stumbling without subsequent striking against object, initial encounter: Secondary | ICD-10-CM | POA: Insufficient documentation

## 2011-05-26 ENCOUNTER — Ambulatory Visit (INDEPENDENT_AMBULATORY_CARE_PROVIDER_SITE_OTHER): Payer: Medicare Other | Admitting: General Surgery

## 2011-05-26 ENCOUNTER — Encounter (INDEPENDENT_AMBULATORY_CARE_PROVIDER_SITE_OTHER): Payer: Self-pay | Admitting: General Surgery

## 2011-05-26 VITALS — BP 116/78 | HR 60 | Temp 96.2°F | Resp 18 | Ht 74.0 in | Wt 192.4 lb

## 2011-05-26 DIAGNOSIS — Z09 Encounter for follow-up examination after completed treatment for conditions other than malignant neoplasm: Secondary | ICD-10-CM

## 2011-05-26 NOTE — Progress Notes (Signed)
HPI The patient is doing well and his bowels are working well.  PE He has a small amount of her abdominal distention in the lower aspect of his wound. There is also some associated ecchymoses and/or hematoma. This appears to be completely benign  Studiy review None.  Assessment Doing well status post exploratory laparotomy for bowel obstruction.  Plan See the patient back in clinic on a p.r.n. basis only.

## 2011-05-28 LAB — CBC
HCT: 40.2
HCT: 41.2
MCHC: 33.9
MCV: 88.5
MCV: 88.6
Platelets: 228
Platelets: 278
RDW: 14
RDW: 14.2
WBC: 6.6
WBC: 6.7

## 2011-05-28 LAB — COMPREHENSIVE METABOLIC PANEL
AST: 17
Albumin: 3.6
Albumin: 3.7
BUN: 14
BUN: 14
Calcium: 9.9
Chloride: 97
Chloride: 97
Creatinine, Ser: 0.78
Creatinine, Ser: 0.99
GFR calc Af Amer: >60
GFR calc non Af Amer: >60
Potassium: 4.2
Total Bilirubin: 0.6
Total Protein: 6.5

## 2011-05-28 LAB — CULTURE, RESPIRATORY W GRAM STAIN: Culture: NO GROWTH

## 2011-05-28 LAB — URINALYSIS, ROUTINE W REFLEX MICROSCOPIC
Bilirubin Urine: NEGATIVE
Ketones, ur: NEGATIVE
Nitrite: NEGATIVE
Specific Gravity, Urine: 1.019
Urobilinogen, UA: 0.2

## 2011-05-28 LAB — BLOOD GAS, ARTERIAL
Acid-Base Excess: 4.1 — ABNORMAL HIGH
O2 Saturation: 94.9
Patient temperature: 98.6
TCO2: 30

## 2011-05-28 LAB — TYPE AND SCREEN
ABO/RH(D): O POS
Antibody Screen: NEGATIVE

## 2011-05-28 LAB — FUNGUS CULTURE W SMEAR: Fungal Smear: NONE SEEN

## 2011-05-28 LAB — ABO/RH: ABO/RH(D): O POS

## 2011-05-28 LAB — PROTIME-INR
INR: 1
INR: 1

## 2011-05-28 LAB — APTT
aPTT: 33
aPTT: 34

## 2011-05-28 LAB — AFB CULTURE WITH SMEAR (NOT AT ARMC)

## 2011-05-29 LAB — BASIC METABOLIC PANEL
BUN: 8
CO2: 32
CO2: 34 — ABNORMAL HIGH
Chloride: 94 — ABNORMAL LOW
Chloride: 97
Creatinine, Ser: 0.65
Glucose, Bld: 121 — ABNORMAL HIGH
Glucose, Bld: 133 — ABNORMAL HIGH
Potassium: 3.9
Sodium: 133 — ABNORMAL LOW

## 2011-05-29 LAB — POCT I-STAT 3, VENOUS BLOOD GAS (G3P V)
Acid-Base Excess: 5 — ABNORMAL HIGH
Bicarbonate: 31.8 — ABNORMAL HIGH
Operator id: 199821
Patient temperature: 97.9

## 2011-05-29 LAB — CBC
HCT: 34.2 — ABNORMAL LOW
HCT: 34.8 — ABNORMAL LOW
Hemoglobin: 11.7 — ABNORMAL LOW
MCHC: 34.2
MCHC: 34.9
MCV: 88.7
MCV: 88.7
Platelets: 201
Platelets: 206
RDW: 13.8
RDW: 14

## 2011-05-29 LAB — COMPREHENSIVE METABOLIC PANEL
Albumin: 2.6 — ABNORMAL LOW
Alkaline Phosphatase: 41
BUN: 8
Calcium: 8.5
Potassium: 3.7
Total Protein: 5.9 — ABNORMAL LOW

## 2011-06-01 NOTE — Discharge Summary (Signed)
NAMEVITALY, [name] NO.:  [id number]  MEDICAL RECORD NO.:  [id number]  LOCATION:  5118                         FACILITY:  [facility]  PHYSICIAN:  Juanetta Gosling, MDDATE OF BIRTH:  [birth year]  DATE OF ADMISSION:  04/11/2011 DATE OF DISCHARGE:                              DISCHARGE SUMMARY   REASON FOR ADMISSION:  Small-bowel obstruction.  DISCHARGE DIAGNOSES: 1. Small-bowel obstruction, status post exploratory laparotomy and     internal lysis of small bowel adhesion. 2. Hypertension. 3. Hypothyroidism.  DISCHARGE MEDICATIONS:  Home medications continued : 1. Atorvastatin 40 mg 1 tab p.o. q.p.m. 2. Chlorthalidone 25 mg half a tab p.o. q.p.m. 3. Fluticasone nasal spray 50 mcg 2 sprays nasally daily. 4. Klor-Con 10 mEq 1 tab p.o. daily. 5. Levothyroxine 112 mcg 1 tab p.o. daily. 6. Lisinopril 10 mg 1 tab p.o. q.a.m. 7. Viagra 100 mg 1.5 to 1 tab p.o. daily as needed for erectile     dysfunction. NEW MEDICATIONS AT DISCHARGE:  Vicodin 5/500 one to two tabs p.o. every 6 h. as needed for pain.  PERTINENT LABORATORY DATA AT DISCHARGE:  Discharge white blood cell count is 5.8.  BRIEF HOSPITAL COURSE:  The patient is an 77 year old male who presented with constipation for 2 days and nausea, vomiting x1 day diagnosed with small-bowel obstruction.  He was taken to the OR by Dr. Jimmye Norman on April 15, 2011, for exploratory laparotomy and subsequent internal lysis of small bowel adhesion.  Please see operative report for full details of procedure.  Postoperatively, the patient was transferred back to the 5100.   His postop course was relatively uneventful.  His diet was slowly advanced.  He tolerated up to a full diet.  By the time of discharge he had multiple bowel movements, nonbloody formed stools.  Prior to discharge, he was evaluated by PT and OT to assess for any home health needs.  There were no home health needs identified.  At discharge, the  patient's pain is well controlled with fentanyl.  He will be transitioned to Vicodin for home pain control to take as needed.  The patient's wound is intact.  He has staples sub-scapular to suprapubic region through the umbilicus.  Staples are intact.  Surrounding wound is clean and dry.  There is some residual erythema, but no drainage from the wound.  DISCHARGE INSTRUCTIONS:  The patient is discharged to home with instructions to advance activity slowly to walk with a walker, to not lift more than 15 pounds in 2 weeks.  He is instructed that he may shower and bathe, but to pat and dry the wound site. He was instructed to continue to wear his binder until followup.  FOLLOWUP APPOINTMENTS:  The patient has a followup appointment scheduled with Dr. Jimmye Norman on April 28, 2011, at 10:50 a.m.  DISCHARGE CONDITION:  The patient is discharged to home in stable medical condition.    ______________________________ Dessa Phi, MD   ______________________________ Juanetta Gosling, MD    JF/MEDQ  D:  04/21/2011  T:  04/21/2011  Job:  [id number]  Electronically Signed by Dessa Phi MD on 05/31/2011 11:13:19 PM Electronically Signed by Emelia Loron MD on  06/01/2011 09:40:07 PM

## 2011-06-09 LAB — BASIC METABOLIC PANEL
BUN: 12
BUN: 4 — ABNORMAL LOW
CO2: 33 — ABNORMAL HIGH
CO2: 33 — ABNORMAL HIGH
Calcium: 8.6
Calcium: 8.8
Calcium: 9.6
Chloride: 100
Creatinine, Ser: 0.7
Creatinine, Ser: 0.76
GFR calc non Af Amer: >60
GFR calc non Af Amer: >60
Glucose, Bld: 113 — ABNORMAL HIGH
Glucose, Bld: 123 — ABNORMAL HIGH
Glucose, Bld: 98
Potassium: 3.7
Sodium: 140

## 2011-06-09 LAB — CBC
Hemoglobin: 12.1 — ABNORMAL LOW
MCHC: 32.7
MCHC: 33.6
MCV: 85.8
Platelets: 162
Platelets: 235
RDW: 15.4
RDW: 15.4
RDW: 15.7 — ABNORMAL HIGH
WBC: 5.8

## 2011-06-09 LAB — DIFFERENTIAL
Basophils Absolute: 0
Basophils Absolute: 0
Basophils Absolute: 0
Basophils Relative: 0
Basophils Relative: 0
Eosinophils Absolute: 0.1
Eosinophils Relative: 1
Lymphocytes Relative: 27
Lymphocytes Relative: 9 — ABNORMAL LOW
Lymphs Abs: 0.5 — ABNORMAL LOW
Monocytes Absolute: 0.7
Monocytes Relative: 10
Neutro Abs: 4
Neutro Abs: 4.6
Neutro Abs: 5.7
Neutrophils Relative %: 79 — ABNORMAL HIGH

## 2011-06-15 LAB — DIFFERENTIAL
Basophils Absolute: 0
Basophils Relative: 0
Basophils Relative: 1
Eosinophils Absolute: 0.2
Eosinophils Absolute: 0.3
Eosinophils Relative: 6 — ABNORMAL HIGH
Lymphs Abs: 0.7
Monocytes Absolute: 0.4
Monocytes Absolute: 0.5
Monocytes Relative: 10
Monocytes Relative: 9
Neutro Abs: 3.2
Neutrophils Relative %: 70

## 2011-06-15 LAB — BASIC METABOLIC PANEL
CO2: 38 — ABNORMAL HIGH
CO2: 38 — ABNORMAL HIGH
Chloride: 96
Chloride: 99
Creatinine, Ser: 0.7
GFR calc Af Amer: >60
GFR calc Af Amer: >60
Glucose, Bld: 92
Sodium: 139
Sodium: 141

## 2011-06-15 LAB — CBC
Hemoglobin: 12.1 — ABNORMAL LOW
Hemoglobin: 12.5 — ABNORMAL LOW
MCHC: 33.8
MCHC: 34.3
MCV: 88.9
MCV: 89.4
RBC: 4.01 — ABNORMAL LOW
RBC: 4.11 — ABNORMAL LOW
RDW: 13.1
WBC: 4.6

## 2011-06-16 LAB — CBC
HCT: 35.9 — ABNORMAL LOW
HCT: 37.1 — ABNORMAL LOW
HCT: 38.9 — ABNORMAL LOW
HCT: 38.8 — ABNORMAL LOW
HCT: 41.8
Hemoglobin: 12.5 — ABNORMAL LOW
Hemoglobin: 13.2
Hemoglobin: 14.4
Hemoglobin: 13.1
Hemoglobin: 14.7
Hemoglobin: 16
MCHC: 33
MCHC: 33.5
MCHC: 33.6
MCHC: 33.7
MCHC: 33.9
MCHC: 34
MCHC: 33.1
MCHC: 33.5
MCHC: 33.5
MCV: 88.4
MCV: 88.8
MCV: 89.9
MCV: 88.7
MCV: 89.8
MCV: 90.1
Platelets: 185
Platelets: 206
Platelets: 306
Platelets: 231
Platelets: 257
RBC: 3.98 — ABNORMAL LOW
RBC: 4.38
RBC: 4.78
RBC: 4.76
RBC: 4.88
RBC: 5.01
RBC: 5.32
RDW: 12.6
RDW: 13.5
RDW: 13.9
RDW: 13.5
RDW: 13.5
RDW: 13.7
RDW: 14
WBC: 6.2
WBC: 7.1
WBC: 5.6
WBC: 6.4
WBC: 9.3

## 2011-06-16 LAB — BASIC METABOLIC PANEL
BUN: 5 — ABNORMAL LOW
BUN: 26 — ABNORMAL HIGH
BUN: 9
CO2: 26
CO2: 32
CO2: 37 — ABNORMAL HIGH
CO2: 38 — ABNORMAL HIGH
CO2: 39 — ABNORMAL HIGH
CO2: 32
CO2: 37 — ABNORMAL HIGH
Calcium: 8.3 — ABNORMAL LOW
Calcium: 8.4
Calcium: 8.7
Calcium: 9
Calcium: 9.1
Calcium: 9.1
Chloride: 100
Chloride: 94 — ABNORMAL LOW
Chloride: 97
Chloride: 100
Chloride: 102
Chloride: 103
Creatinine, Ser: 0.7
Creatinine, Ser: 0.75
Creatinine, Ser: 0.88
Creatinine, Ser: 1.03
Creatinine, Ser: 1.27
Creatinine, Ser: 0.93
Creatinine, Ser: 0.95
Creatinine, Ser: 1.16
Creatinine, Ser: 1.55 — ABNORMAL HIGH
GFR calc Af Amer: >60
GFR calc Af Amer: >60
GFR calc Af Amer: >60
GFR calc Af Amer: >60
GFR calc Af Amer: 53 — ABNORMAL LOW
GFR calc Af Amer: >60
GFR calc Af Amer: >60
GFR calc Af Amer: >60
GFR calc Af Amer: >60
GFR calc non Af Amer: >60
GFR calc non Af Amer: >60
GFR calc non Af Amer: >60
GFR calc non Af Amer: >60
GFR calc non Af Amer: >60
Glucose, Bld: 107 — ABNORMAL HIGH
Glucose, Bld: 112 — ABNORMAL HIGH
Glucose, Bld: 112 — ABNORMAL HIGH
Glucose, Bld: 113 — ABNORMAL HIGH
Glucose, Bld: 114 — ABNORMAL HIGH
Glucose, Bld: 123 — ABNORMAL HIGH
Glucose, Bld: 95
Potassium: 3.7
Potassium: 3.7
Potassium: 2.7 — CL
Potassium: 4.2
Sodium: 137
Sodium: 139
Sodium: 139
Sodium: 144

## 2011-06-16 LAB — DIFFERENTIAL
Basophils Absolute: 0
Basophils Absolute: 0
Basophils Absolute: 0
Basophils Absolute: 0
Basophils Relative: 0
Basophils Relative: 0
Basophils Relative: 1
Basophils Relative: 0
Eosinophils Absolute: 0.3
Eosinophils Absolute: 0
Eosinophils Relative: 1
Eosinophils Relative: 1
Eosinophils Relative: 4
Lymphocytes Relative: 10 — ABNORMAL LOW
Lymphocytes Relative: 10 — ABNORMAL LOW
Lymphocytes Relative: 13
Lymphs Abs: 0.7
Lymphs Abs: 0.8
Lymphs Abs: 1.2
Monocytes Absolute: 0.7
Monocytes Absolute: 0.8
Monocytes Relative: 9
Monocytes Relative: 6
Neutro Abs: 3.8
Neutro Abs: 6.5
Neutro Abs: 7
Neutrophils Relative %: 65
Neutrophils Relative %: 73
Neutrophils Relative %: 77
Neutrophils Relative %: 89 — ABNORMAL HIGH

## 2011-06-16 LAB — POTASSIUM
Potassium: 3.5
Potassium: 4.2

## 2011-06-16 LAB — COMPREHENSIVE METABOLIC PANEL
ALT: 24
ALT: 29
Albumin: 3.5
Alkaline Phosphatase: 54
CO2: 29
Calcium: 10.3
Chloride: 93 — ABNORMAL LOW
GFR calc Af Amer: >60
GFR calc non Af Amer: 32 — ABNORMAL LOW
Glucose, Bld: 126 — ABNORMAL HIGH
Glucose, Bld: 168 — ABNORMAL HIGH
Potassium: 3.1 — ABNORMAL LOW
Sodium: 136
Sodium: 137
Total Bilirubin: 1.3 — ABNORMAL HIGH
Total Protein: 6.7
Total Protein: 7.4

## 2011-06-16 LAB — CLOSTRIDIUM DIFFICILE EIA
C difficile Toxins A+B, EIA: NEGATIVE
C difficile Toxins A+B, EIA: NEGATIVE

## 2011-06-16 LAB — MAGNESIUM: Magnesium: 1.6

## 2011-06-16 LAB — PHOSPHORUS: Phosphorus: 2.3

## 2011-07-07 ENCOUNTER — Other Ambulatory Visit: Payer: Self-pay | Admitting: Gastroenterology

## 2011-07-21 ENCOUNTER — Other Ambulatory Visit (HOSPITAL_BASED_OUTPATIENT_CLINIC_OR_DEPARTMENT_OTHER): Payer: Medicare Other | Admitting: Lab

## 2011-07-21 ENCOUNTER — Other Ambulatory Visit: Payer: Self-pay | Admitting: Internal Medicine

## 2011-07-21 ENCOUNTER — Telehealth: Payer: Self-pay | Admitting: Internal Medicine

## 2011-07-21 DIAGNOSIS — C3430 Malignant neoplasm of lower lobe, unspecified bronchus or lung: Secondary | ICD-10-CM

## 2011-07-21 DIAGNOSIS — C7A090 Malignant carcinoid tumor of the bronchus and lung: Secondary | ICD-10-CM

## 2011-07-21 LAB — CBC WITH DIFFERENTIAL/PLATELET
BASO%: 0.4 % (ref 0.0–2.0)
Eosinophils Absolute: 0.2 10*3/uL (ref 0.0–0.5)
MCHC: 32.7 g/dL (ref 32.0–36.0)
MONO#: 0.6 10*3/uL (ref 0.1–0.9)
MONO%: 7.8 % (ref 0.0–14.0)
NEUT#: 4.5 10*3/uL (ref 1.5–6.5)
RBC: 4.63 10*6/uL (ref 4.20–5.82)
RDW: 14.9 % — ABNORMAL HIGH (ref 11.0–14.6)
WBC: 7.1 10*3/uL (ref 4.0–10.3)

## 2011-07-21 LAB — COMPREHENSIVE METABOLIC PANEL
ALT: 10 U/L (ref 0–53)
Albumin: 4.1 g/dL (ref 3.5–5.2)
Alkaline Phosphatase: 62 U/L (ref 39–117)
CO2: 31 mEq/L (ref 19–32)
Glucose, Bld: 93 mg/dL (ref 70–99)
Potassium: 4.2 mEq/L (ref 3.5–5.3)
Sodium: 140 mEq/L (ref 135–145)
Total Bilirubin: 0.5 mg/dL (ref 0.3–1.2)
Total Protein: 6.4 g/dL (ref 6.0–8.3)

## 2011-07-21 NOTE — Telephone Encounter (Signed)
Lvm advising 11/21 md appt has been cancelled due to Epic. Advised in vm to keep all other scheduled appts and we will call back later to r/s md visit.

## 2011-07-22 ENCOUNTER — Ambulatory Visit (HOSPITAL_COMMUNITY)
Admission: RE | Admit: 2011-07-22 | Discharge: 2011-07-22 | Disposition: A | Discharge status: Home / Self Care | Payer: Medicare Other | Source: Ambulatory Visit | Attending: Internal Medicine | Admitting: Internal Medicine

## 2011-07-22 ENCOUNTER — Encounter (HOSPITAL_COMMUNITY): Payer: Self-pay

## 2011-07-22 DIAGNOSIS — Z9221 Personal history of antineoplastic chemotherapy: Secondary | ICD-10-CM | POA: Insufficient documentation

## 2011-07-22 DIAGNOSIS — J984 Other disorders of lung: Secondary | ICD-10-CM | POA: Insufficient documentation

## 2011-07-22 DIAGNOSIS — E279 Disorder of adrenal gland, unspecified: Secondary | ICD-10-CM | POA: Insufficient documentation

## 2011-07-22 DIAGNOSIS — Z902 Acquired absence of lung [part of]: Secondary | ICD-10-CM | POA: Insufficient documentation

## 2011-07-22 DIAGNOSIS — Z85038 Personal history of other malignant neoplasm of large intestine: Secondary | ICD-10-CM | POA: Insufficient documentation

## 2011-07-22 DIAGNOSIS — J9819 Other pulmonary collapse: Secondary | ICD-10-CM | POA: Insufficient documentation

## 2011-07-22 DIAGNOSIS — C3490 Malignant neoplasm of unspecified part of unspecified bronchus or lung: Secondary | ICD-10-CM | POA: Insufficient documentation

## 2011-07-22 DIAGNOSIS — I2510 Atherosclerotic heart disease of native coronary artery without angina pectoris: Secondary | ICD-10-CM | POA: Insufficient documentation

## 2011-07-22 DIAGNOSIS — I700 Atherosclerosis of aorta: Secondary | ICD-10-CM | POA: Insufficient documentation

## 2011-07-22 MED ORDER — IOHEXOL 300 MG/ML  SOLN
80.0000 mL | Freq: Once | INTRAMUSCULAR | Status: AC | PRN
  Administered 2011-07-22: 80 mL via INTRAVENOUS

## 2011-08-12 ENCOUNTER — Telehealth: Payer: Self-pay | Admitting: Internal Medicine

## 2011-08-12 NOTE — Telephone Encounter (Signed)
S/w pt's wife, gave appt 10/13/11 @ 9am r/s'd from 11/21 appt cx'd due to Epic.

## 2011-10-13 ENCOUNTER — Ambulatory Visit (HOSPITAL_BASED_OUTPATIENT_CLINIC_OR_DEPARTMENT_OTHER): Payer: Medicare Other | Admitting: Internal Medicine

## 2011-10-13 ENCOUNTER — Telehealth: Payer: Self-pay | Admitting: Internal Medicine

## 2011-10-13 VITALS — BP 148/66 | HR 81 | Temp 97.0°F | Ht 74.0 in | Wt 206.0 lb

## 2011-10-13 DIAGNOSIS — C3430 Malignant neoplasm of lower lobe, unspecified bronchus or lung: Secondary | ICD-10-CM

## 2011-10-13 DIAGNOSIS — C3490 Malignant neoplasm of unspecified part of unspecified bronchus or lung: Secondary | ICD-10-CM

## 2011-10-13 NOTE — Progress Notes (Signed)
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  PRIOR THERAPY:   1. Status post left upper lobectomy with lymph node dissection under the care of Dr. Edwyna Shell on October 10, 2007. 2. Status post 4 cycles of adjuvant chemotherapy with carboplatin and docetaxel.   Last dose was given Jan 20, 2008.     Observation.  INTERVAL HISTORY: RIC ROSENBERG 78 y.o. male returns to the clinic today for routine six-month followup visit accompanied his wife. The patient has no complaints today except for generalized weakness. He underwent exploratory laparotomy with enterolysis under the care of Dr. Lindie Spruce in August of 2012 for small bowel obstruction. The patient denied having any significant weight loss or night sweats. He has no chest pain or shortness of breath, no cough or hemoptysis. No significant nausea and vomiting. He has repeat CT scan of the chest performed in November of 2012 and he is here today for evaluation and discussion of his scan results.  MEDICAL HISTORY: Past Medical History  Diagnosis Date  . Hyperlipidemia   . Hypertension   . Allergy   . Fractured shoulder 8/30?    pt fell about 3 weeks ago and fractured shoulder blade   . Cancer     lung & colon ca    ALLERGIES:  is allergic to morphine and related and codeine.  MEDICATIONS:  Current Outpatient Prescriptions  Medication Sig Dispense Refill  . atorvastatin (LIPITOR) 40 MG tablet Take 40 mg by mouth daily.        . Levothyroxine Sodium (LEVOXYL PO) Take by mouth.        . Multiple Vitamin (MULTIVITAMIN) tablet Take 1 tablet by mouth daily.        SURGICAL HISTORY:  Past Surgical History  Procedure Date  . Colon surgery   . Colostomy takedown   . Colostomy revision   . Hernia repair     REVIEW OF  SYSTEMS:  A comprehensive review of systems was negative.   PHYSICAL EXAMINATION: General appearance: alert, cooperative and no distress Lymph nodes: Cervical, supraclavicular, and axillary nodes normal. Resp: clear to auscultation bilaterally Cardio: regular rate and rhythm, S1, S2 normal, no murmur, click, rub or gallop GI: soft, non-tender; bowel sounds normal; no masses,  no organomegaly Extremities: extremities normal, atraumatic, no cyanosis or edema  ECOG PERFORMANCE STATUS: 0 - Asymptomatic  Blood pressure 148/66, pulse 81, temperature 97 F (36.1 C), temperature source Oral, height 6\' 2"  (1.88 m), weight 206 lb (93.441 kg).  LABORATORY DATA: Lab Results  Component Value Date   WBC 7.1 07/21/2011   HGB 13.7 07/21/2011   HCT 41.9 07/21/2011   MCV 90.5 07/21/2011   PLT 209 07/21/2011      Chemistry      Component Value Date/Time   NA 140 07/21/2011 1256   NA 140 07/21/2011 1256   K 4.2 07/21/2011 1256   K 4.2 07/21/2011 1256   CL 102 07/21/2011 1256   CL 102 07/21/2011 1256   CO2 31 07/21/2011 1256   CO2 31 07/21/2011 1256   BUN 10 07/21/2011 1256   BUN 10 07/21/2011 1256   CREATININE 0.74 07/21/2011 1256   CREATININE 0.74 07/21/2011 1256      Component Value Date/Time   CALCIUM 9.6 07/21/2011 1256  CALCIUM 9.6 07/21/2011 1256   ALKPHOS 62 07/21/2011 1256   ALKPHOS 62 07/21/2011 1256   AST 16 07/21/2011 1256   AST 16 07/21/2011 1256   ALT 10 07/21/2011 1256   ALT 10 07/21/2011 1256   BILITOT 0.5 07/21/2011 1256   BILITOT 0.5 07/21/2011 1256       RADIOGRAPHIC STUDIES: CT CHEST WITH CONTRAST  Technique: Multidetector CT imaging of the chest was performed  following the standard protocol during bolus administration of  intravenous contrast.  Contrast: 80mL OMNIPAQUE IOHEXOL 300 MG/ML IV SOLN  Comparison: 01/26/2011  Findings: Status post left lower lobectomy. Dependent atelectasis  at the lung bases. Mild linear scarring in the right upper lobe    (series 5/image 17). No suspicious pulmonary nodules. No pleural  effusion or pneumothorax.  Heart is normal in size. No pericardial effusion. Coronary  atherosclerosis. Atherosclerotic calcifications of the aortic  arch.  No suspicious mediastinal, hilar, or axillary lymphadenopathy.  Mediastinal surgical clips.  The visualized upper abdomen is notable for left renal cysts and a  nonobstructing right renal calculus. Tiny left adrenal nodule,  likely reflecting an adenoma.  Degenerative changes of the visualized thoracolumbar spine. No  focal osseous lesions.  IMPRESSION:  Status post left lower lobectomy.  No evidence of recurrent or metastatic disease in the chest.  Original Report Authenticated By: Charline Bills, M.D.   ASSESSMENT: This is a very pleasant 78 years old white male with history of stage IIb non-small cell lung cancer status post left upper lobectomy with lymph node dissection followed by 4 cycles of adjuvant chemotherapy with carboplatin and docetaxel. The patient has been observation since May of 2009 with no evidence for disease recurrence.   PLAN: I discussed the scan results with the patient and his wife today and recommended for him continuous observation with repeat CT scan of the chest with contrast in 4 months. He would come back for followup visit at that time. I advised the patient to call me immediately if he has any concerning symptoms in the interval.   All questions were answered. The patient knows to call the clinic with any problems, questions or concerns. We can certainly see the patient much sooner if necessary.

## 2011-10-13 NOTE — Telephone Encounter (Signed)
Gv pt appt for june2013.  scheduled ct scan on 06/05 @ [facility]

## 2012-02-09 ENCOUNTER — Other Ambulatory Visit (HOSPITAL_BASED_OUTPATIENT_CLINIC_OR_DEPARTMENT_OTHER): Payer: Medicare Other | Admitting: Lab

## 2012-02-09 ENCOUNTER — Other Ambulatory Visit: Payer: Self-pay | Admitting: Medical Oncology

## 2012-02-09 DIAGNOSIS — C3430 Malignant neoplasm of lower lobe, unspecified bronchus or lung: Secondary | ICD-10-CM

## 2012-02-09 LAB — CBC WITH DIFFERENTIAL/PLATELET
Basophils Absolute: 0 10*3/uL (ref 0.0–0.1)
EOS%: 2.2 % (ref 0.0–7.0)
Eosinophils Absolute: 0.1 10*3/uL (ref 0.0–0.5)
HCT: 43.9 % (ref 38.4–49.9)
HGB: 14.8 g/dL (ref 13.0–17.1)
LYMPH%: 22.7 % (ref 14.0–49.0)
MCH: 30.3 pg (ref 27.2–33.4)
MCV: 89.7 fL (ref 79.3–98.0)
MONO%: 7.8 % (ref 0.0–14.0)
NEUT#: 4 10*3/uL (ref 1.5–6.5)
NEUT%: 66.9 % (ref 39.0–75.0)
Platelets: 185 10*3/uL (ref 140–400)

## 2012-02-09 LAB — COMPREHENSIVE METABOLIC PANEL
AST: 18 U/L (ref 0–37)
Albumin: 4 g/dL (ref 3.5–5.2)
Alkaline Phosphatase: 62 U/L (ref 39–117)
BUN: 13 mg/dL (ref 6–23)
Glucose, Bld: 100 mg/dL — ABNORMAL HIGH (ref 70–99)
Potassium: 4 mEq/L (ref 3.5–5.3)
Total Bilirubin: 0.7 mg/dL (ref 0.3–1.2)

## 2012-02-10 ENCOUNTER — Encounter (HOSPITAL_COMMUNITY): Payer: Self-pay

## 2012-02-10 ENCOUNTER — Ambulatory Visit (HOSPITAL_COMMUNITY)
Admission: RE | Admit: 2012-02-10 | Discharge: 2012-02-10 | Disposition: A | Discharge status: Home / Self Care | Payer: Medicare Other | Source: Ambulatory Visit | Attending: Internal Medicine | Admitting: Internal Medicine

## 2012-02-10 DIAGNOSIS — I700 Atherosclerosis of aorta: Secondary | ICD-10-CM | POA: Insufficient documentation

## 2012-02-10 DIAGNOSIS — Z85038 Personal history of other malignant neoplasm of large intestine: Secondary | ICD-10-CM | POA: Insufficient documentation

## 2012-02-10 DIAGNOSIS — I2510 Atherosclerotic heart disease of native coronary artery without angina pectoris: Secondary | ICD-10-CM | POA: Insufficient documentation

## 2012-02-10 DIAGNOSIS — C3430 Malignant neoplasm of lower lobe, unspecified bronchus or lung: Secondary | ICD-10-CM

## 2012-02-10 DIAGNOSIS — Z902 Acquired absence of lung [part of]: Secondary | ICD-10-CM | POA: Insufficient documentation

## 2012-02-10 HISTORY — DX: Malignant neoplasm of unspecified part of unspecified bronchus or lung: C34.90

## 2012-02-10 MED ORDER — IOHEXOL 300 MG/ML  SOLN
80.0000 mL | Freq: Once | INTRAMUSCULAR | Status: AC | PRN
  Administered 2012-02-10: 80 mL via INTRAVENOUS

## 2012-02-15 ENCOUNTER — Ambulatory Visit (HOSPITAL_BASED_OUTPATIENT_CLINIC_OR_DEPARTMENT_OTHER): Payer: Medicare Other | Admitting: Internal Medicine

## 2012-02-15 ENCOUNTER — Telehealth: Payer: Self-pay | Admitting: Internal Medicine

## 2012-02-15 VITALS — BP 137/64 | HR 85 | Temp 96.9°F | Ht 74.0 in | Wt 204.6 lb

## 2012-02-15 DIAGNOSIS — C3430 Malignant neoplasm of lower lobe, unspecified bronchus or lung: Secondary | ICD-10-CM

## 2012-02-15 NOTE — Progress Notes (Signed)
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  PRIOR THERAPY:  1. Status post left upper lobectomy with lymph node dissection under the care of Dr. Edwyna Shell on October 10, 2007. 2. Status post 4 cycles of adjuvant chemotherapy with carboplatin and docetaxel. Last dose was given Jan 20, 2008.  CURRENT THERAPY: Observation.  INTERVAL HISTORY: [name] 78 y.o. male returns to the clinic today for routine followup visit accompanied by his wife. The patient has no complaints today. He denied having any significant weight loss or night sweats. He has no chest pain or shortness breath, no cough or hemoptysis. He has repeat CT scan of the chest performed recently and he is here today for evaluation and discussion of his scan results.  MEDICAL HISTORY: Past Medical History  Diagnosis Date  . Hyperlipidemia   . Hypertension   . Allergy   . Fractured shoulder 8/30?    pt fell about 3 weeks ago and fractured shoulder blade   . colon ca dx'd 1994    chemo  . Lung cancer dx'd 07/2007    lu-lobectomy    ALLERGIES:  is allergic to morphine and related and codeine.  MEDICATIONS:  Current Outpatient Prescriptions  Medication Sig Dispense Refill  . atorvastatin (LIPITOR) 40 MG tablet Take 40 mg by mouth daily.        . Levothyroxine Sodium (LEVOXYL PO) Take 112 mcg by mouth.       . loratadine (CLARITIN) 10 MG tablet Take 10 mg by mouth daily.      . Multiple Vitamin (MULTIVITAMIN) tablet Take 1 tablet by mouth daily.      Marland Kitchen triamterene-hydrochlorothiazide (DYAZIDE) 50-25 MG per capsule Take 1 capsule by mouth daily.        SURGICAL HISTORY:  Past Surgical History  Procedure Date  . Colon surgery   . Colostomy takedown   .  Colostomy revision   . Hernia repair     REVIEW OF SYSTEMS:  A comprehensive review of systems was negative.   PHYSICAL EXAMINATION: General appearance: alert, cooperative and no distress Neck: no adenopathy Lymph nodes: Cervical, supraclavicular, and axillary nodes normal. Resp: clear to auscultation bilaterally Cardio: regular rate and rhythm, S1, S2 normal, no murmur, click, rub or gallop GI: soft, non-tender; bowel sounds normal; no masses,  no organomegaly Extremities: extremities normal, atraumatic, no cyanosis or edema Neurologic: Alert and oriented X 3, normal strength and tone. Normal symmetric reflexes. Normal coordination and gait  ECOG PERFORMANCE STATUS: 0 - Asymptomatic  Blood pressure 137/64, pulse 85, temperature 96.9 F (36.1 C), temperature source Oral, height 6\' 2"  (1.88 m), weight 204 lb 9.6 oz (92.806 kg).  LABORATORY DATA: Lab Results  Component Value Date   WBC 5.9 02/09/2012   HGB 14.8 02/09/2012   HCT 43.9 02/09/2012   MCV 89.7 02/09/2012   PLT 185 02/09/2012      Chemistry      Component Value Date/Time   NA 143 02/09/2012 0809   K 4.0 02/09/2012 0809   CL 103 02/09/2012 0809   CO2 31 02/09/2012 0809   BUN 13 02/09/2012 0809   CREATININE 0.83 02/09/2012 0809      Component Value Date/Time   CALCIUM 9.1 02/09/2012 0809   ALKPHOS 62 02/09/2012  0809   AST 18 02/09/2012 0809   ALT 12 02/09/2012 0809   BILITOT 0.7 02/09/2012 0809       RADIOGRAPHIC STUDIES: Ct Chest W Contrast  02/10/2012  *RADIOLOGY REPORT*  Clinical Data: Lung cancer diagnosed 2008, status post surgery, remote history of colon cancer  CT CHEST WITH CONTRAST  Technique:  Multidetector CT imaging of the chest was performed following the standard protocol during bolus administration of intravenous contrast.  Contrast: 80mL OMNIPAQUE IOHEXOL 300 MG/ML  SOLN  Comparison: 07/22/2011  Findings: Status post left lower lobectomy.  Mild centrilobular emphysematous changes.  No suspicious pulmonary nodules.  Mild  dependent scarring versus atelectasis in the bilateral lower lobes. Mild linear scarring in the right upper lobe.  No pleural effusion or pneumothorax.  Heart is normal in size.  No pericardial effusion.  Coronary atherosclerosis.  Atherosclerotic calcifications of the aortic arch.  Narrowing/eccentric plaque near the origin of the left subclavian artery (series 2/image 12).  No suspicious mediastinal, hilar, or axillary lymphadenopathy. Mediastinal surgical clips.  Visualized upper abdomen is notable for small bilateral renal cysts and a tiny left adrenal adenoma.  Degenerative changes of the visualized thoracolumbar spine.  IMPRESSION: Status post left lower lobectomy.  No evidence of recurrent or metastatic disease in the chest.  Original Report Authenticated By: Charline Bills, M.D.    ASSESSMENT: This is a very pleasant 78 years old white male with history of stage IIb non-small cell lung cancer status post left upper lobectomy followed by 4 cycles of adjuvant chemotherapy and the patient has been observation since May of 2009. He has no evidence for disease recurrence on his recent scan  PLAN:  I discussed the scan results with the patient and his wife. I recommended for him continuous observation for now with repeat CT scan of the chest with contrast in 6 months. He would come back for followup visit at that time. He was advised to call me immediately if he has any concerning symptoms in the interval.  All questions were answered. The patient knows to call the clinic with any problems, questions or concerns. We can certainly see the patient much sooner if necessary.

## 2012-02-15 NOTE — Telephone Encounter (Signed)
appts made and printed for pt aom °

## 2012-06-01 ENCOUNTER — Ambulatory Visit (INDEPENDENT_AMBULATORY_CARE_PROVIDER_SITE_OTHER): Payer: Medicare Other | Admitting: Vascular Surgery

## 2012-06-01 DIAGNOSIS — R0989 Other specified symptoms and signs involving the circulatory and respiratory systems: Secondary | ICD-10-CM

## 2012-06-01 DIAGNOSIS — I6529 Occlusion and stenosis of unspecified carotid artery: Secondary | ICD-10-CM

## 2012-08-11 ENCOUNTER — Other Ambulatory Visit (HOSPITAL_BASED_OUTPATIENT_CLINIC_OR_DEPARTMENT_OTHER): Payer: Medicare Other | Admitting: Lab

## 2012-08-11 DIAGNOSIS — C3430 Malignant neoplasm of lower lobe, unspecified bronchus or lung: Secondary | ICD-10-CM

## 2012-08-11 LAB — CBC WITH DIFFERENTIAL/PLATELET
BASO%: 0.3 % (ref 0.0–2.0)
Basophils Absolute: 0 10*3/uL (ref 0.0–0.1)
EOS%: 2.4 % (ref 0.0–7.0)
Eosinophils Absolute: 0.1 10*3/uL (ref 0.0–0.5)
HCT: 44.4 % (ref 38.4–49.9)
HGB: 15.2 g/dL (ref 13.0–17.1)
LYMPH%: 21.2 % (ref 14.0–49.0)
MCH: 30.8 pg (ref 27.2–33.4)
MCHC: 34.3 g/dL (ref 32.0–36.0)
MCV: 89.8 fL (ref 79.3–98.0)
MONO#: 0.5 10*3/uL (ref 0.1–0.9)
MONO%: 8.6 % (ref 0.0–14.0)
NEUT#: 4 10*3/uL (ref 1.5–6.5)
NEUT%: 67.5 % (ref 39.0–75.0)
Platelets: 186 10*3/uL (ref 140–400)
RBC: 4.94 10*6/uL (ref 4.20–5.82)
RDW: 13.5 % (ref 11.0–14.6)
WBC: 6 10*3/uL (ref 4.0–10.3)
lymph#: 1.3 10*3/uL (ref 0.9–3.3)

## 2012-08-11 LAB — COMPREHENSIVE METABOLIC PANEL (CC13)
Alkaline Phosphatase: 79 U/L (ref 40–150)
BUN: 9 mg/dL (ref 7.0–26.0)
CO2: 34 mEq/L — ABNORMAL HIGH (ref 22–29)
Glucose: 106 mg/dl — ABNORMAL HIGH (ref 70–99)
Sodium: 141 mEq/L (ref 136–145)
Total Bilirubin: 0.8 mg/dL (ref 0.20–1.20)
Total Protein: 7 g/dL (ref 6.4–8.3)

## 2012-08-12 ENCOUNTER — Encounter (HOSPITAL_COMMUNITY): Payer: Self-pay

## 2012-08-12 ENCOUNTER — Ambulatory Visit (HOSPITAL_COMMUNITY)
Admission: RE | Admit: 2012-08-12 | Discharge: 2012-08-12 | Disposition: A | Discharge status: Home / Self Care | Payer: Medicare Other | Source: Ambulatory Visit | Attending: Internal Medicine | Admitting: Internal Medicine

## 2012-08-12 DIAGNOSIS — Z9221 Personal history of antineoplastic chemotherapy: Secondary | ICD-10-CM | POA: Insufficient documentation

## 2012-08-12 DIAGNOSIS — C189 Malignant neoplasm of colon, unspecified: Secondary | ICD-10-CM | POA: Insufficient documentation

## 2012-08-12 DIAGNOSIS — C3430 Malignant neoplasm of lower lobe, unspecified bronchus or lung: Secondary | ICD-10-CM

## 2012-08-12 MED ORDER — IOHEXOL 300 MG/ML  SOLN
80.0000 mL | Freq: Once | INTRAMUSCULAR | Status: AC | PRN
  Administered 2012-08-12: 80 mL via INTRAVENOUS

## 2012-08-15 ENCOUNTER — Ambulatory Visit (HOSPITAL_BASED_OUTPATIENT_CLINIC_OR_DEPARTMENT_OTHER): Payer: Medicare Other | Admitting: Internal Medicine

## 2012-08-15 ENCOUNTER — Telehealth: Payer: Self-pay | Admitting: Internal Medicine

## 2012-08-15 VITALS — BP 141/74 | HR 89 | Temp 97.4°F | Resp 22 | Ht 74.0 in | Wt 210.7 lb

## 2012-08-15 DIAGNOSIS — C3430 Malignant neoplasm of lower lobe, unspecified bronchus or lung: Secondary | ICD-10-CM

## 2012-08-15 NOTE — Progress Notes (Signed)
[facility] Telephone:([phone]) [phone]   Fax:([phone]  OFFICE PROGRESS NOTE  [name], MD [address] [place] [address]  PRINCIPAL DIAGNOSIS: Stage II-B (T2, N1, MX) non-small cell lung cancer, large cell neuroendocrine carcinoma diagnosed in November 2008.   PRIOR THERAPY:  1. Status post left upper lobectomy with lymph node dissection under the care of Dr. Edwyna Shell on October 10, 2007. 2. Status post 4 cycles of adjuvant chemotherapy with carboplatin and docetaxel. Last dose was given Jan 20, 2008.  CURRENT THERAPY: Observation.  INTERVAL HISTORY: [name] 78 y.o. male returns to the clinic today for routine annual followup visit accompanied his wife. The patient is feeling fine today with no specific complaints except for mild fatigue. He denied having any significant weight loss or night sweats. He has no chest pain, shortness breath, cough or hemoptysis. He had repeat CT scan of the chest performed recently and he is here today for evaluation and discussion of his scan results.  MEDICAL HISTORY: Past Medical History  Diagnosis Date  . Hyperlipidemia   . Hypertension   . Allergy   . Fractured shoulder 8/30?    pt fell about 3 weeks ago and fractured shoulder blade   . colon ca dx'd 1994    chemo  . Lung cancer dx'd 07/2007    lu-lobectomy    ALLERGIES:  is allergic to morphine and related and codeine.  MEDICATIONS:  Current Outpatient Prescriptions  Medication Sig Dispense Refill  . atorvastatin (LIPITOR) 40 MG tablet Take 40 mg by mouth daily.        . Levothyroxine Sodium (LEVOXYL PO) Take 112 mcg by mouth.       . loratadine (CLARITIN) 10 MG tablet Take 10 mg by mouth daily.      . Multiple Vitamin (MULTIVITAMIN) tablet Take 1 tablet by mouth daily.      Marland Kitchen triamterene-hydrochlorothiazide (DYAZIDE) 50-25 MG per capsule Take 1 capsule by mouth daily.        SURGICAL HISTORY:  Past Surgical History  Procedure Date    . Colon surgery   . Colostomy takedown   . Colostomy revision   . Hernia repair     REVIEW OF SYSTEMS:  A comprehensive review of systems was negative except for: Constitutional: positive for fatigue   PHYSICAL EXAMINATION: General appearance: alert, cooperative and no distress Head: Normocephalic, without obvious abnormality, atraumatic Neck: no adenopathy Lymph nodes: Cervical, supraclavicular, and axillary nodes normal. Resp: clear to auscultation bilaterally Cardio: regular rate and rhythm, S1, S2 normal, no murmur, click, rub or gallop GI: soft, non-tender; bowel sounds normal; no masses,  no organomegaly Extremities: extremities normal, atraumatic, no cyanosis or edema  ECOG PERFORMANCE STATUS: 1 - Symptomatic but completely ambulatory  Blood pressure 141/74, pulse 89, temperature 97.4 F (36.3 C), temperature source Oral, resp. rate 22, height 6\' 2"  (1.88 m), weight 210 lb 11.2 oz (95.573 kg).  LABORATORY DATA: Lab Results  Component Value Date   WBC 6.0 08/11/2012   HGB 15.2 08/11/2012   HCT 44.4 08/11/2012   MCV 89.8 08/11/2012   PLT 186 08/11/2012      Chemistry      Component Value Date/Time   NA 141 08/11/2012 0807   NA 143 02/09/2012 0809   K 3.9 08/11/2012 0807   K 4.0 02/09/2012 0809   CL 98 08/11/2012 0807   CL 103 02/09/2012 0809   CO2 34* 08/11/2012 0807   CO2 31 02/09/2012  0809   BUN 9.0 08/11/2012 0807   BUN 13 02/09/2012 0809   CREATININE 1.0 08/11/2012 0807   CREATININE 0.83 02/09/2012 0809      Component Value Date/Time   CALCIUM 9.8 08/11/2012 0807   CALCIUM 9.1 02/09/2012 0809   ALKPHOS 79 08/11/2012 0807   ALKPHOS 62 02/09/2012 0809   AST 20 08/11/2012 0807   AST 18 02/09/2012 0809   ALT 17 08/11/2012 0807   ALT 12 02/09/2012 0809   BILITOT 0.80 08/11/2012 0807   BILITOT 0.7 02/09/2012 0809       RADIOGRAPHIC STUDIES: Ct Chest W Contrast  08/12/2012  *RADIOLOGY REPORT*  Clinical Data: Malignant neoplasm of lower lobe.  Lung cancer with surgery only.  Colon  cancer with chemotherapy completed.  No current complaints.  Stage to be non-small cell lung cancer. Diagnosed 07/2007.  Status post left lower lobectomy with lymph node dissection.  Status post four cycles of chemotherapy.  CT CHEST WITH CONTRAST  Technique:  Multidetector CT imaging of the chest was performed following the standard protocol during bolus administration of intravenous contrast.  Contrast: 80mL OMNIPAQUE IOHEXOL 300 MG/ML  SOLN  Comparison: 02/10/2012 and the clinic note of 02/15/2012.  Findings: Lungs/pleura: Irregularity of the nondependent trachea on image 20/series 5 which is favored to be due to retained secretions; absent on the prior exam.  Status post left lower lobectomy.  Mild centrilobular emphysema. No nodules or airspace opacities. No pleural fluid.    Minimal left- sided pleural thickening which is unchanged.  Heart/Mediastinum: No supraclavicular adenopathy.  Atherosclerosis with moderate stenosis involving the left subclavian artery and 11/series 2.  Unchanged.  Mild cardiomegaly.  Coronary artery atherosclerosis with mitral annular calcifications.  No pericardial fluid. No central pulmonary embolism, on this non-dedicated study.  Small mediastinal nodes which are stable.  No hilar adenopathy.  Upper abdomen: Stable mild left adrenal nodularity.  Similar mild right adrenal thickening.  Small low-density bilateral renal lesions which are likely cysts.  Bones/Musculoskeletal:  Moderate osteopenia.  Remote right rib trauma.  IMPRESSION:  1.  Surgical changes of left lower lobectomy, without recurrent or metastatic disease. 2.  Incidental findings, including moderate narrowing of the proximal left subclavian artery and coronary artery disease.   Original Report Authenticated By: Jeronimo Greaves, M.D.     ASSESSMENT: This is a very pleasant 78 years old white male with history of stage IIb non-small cell lung cancer status post left upper lobectomy followed by 4 cycles of adjuvant  chemotherapy and he has been observation since May of 2009 with no evidence for disease recurrence.  PLAN: I discussed the scan results with the patient and his wife. I recommended for him to continue on observation for now with repeat CT scan of the chest without contrast in one year.  He was advised to call immediately if he has any concerning symptoms in the interval.  All questions were answered. The patient knows to call the clinic with any problems, questions or concerns. We can certainly see the patient much sooner if necessary.

## 2012-08-15 NOTE — Telephone Encounter (Signed)
gv and printed appt schedule to pt for Dec 2014...the patient aware that central schedule will contact with d/t of ct.

## 2012-08-15 NOTE — Patient Instructions (Addendum)
The scan showed no evidence for disease recurrence.  Followup visit in one year with repeat CT scan of the chest. 

## 2012-08-17 ENCOUNTER — Emergency Department (HOSPITAL_COMMUNITY): Payer: Medicare Other

## 2012-08-17 ENCOUNTER — Encounter (HOSPITAL_COMMUNITY): Payer: Self-pay | Admitting: Certified Registered Nurse Anesthetist

## 2012-08-17 ENCOUNTER — Inpatient Hospital Stay (HOSPITAL_COMMUNITY): Payer: Medicare Other

## 2012-08-17 ENCOUNTER — Inpatient Hospital Stay (HOSPITAL_COMMUNITY)
Admission: EM | Admit: 2012-08-17 | Discharge: 2012-08-22 | DRG: 482 | Disposition: A | Discharge status: Skilled Nursing Facility | Payer: Medicare Other | Attending: Internal Medicine | Admitting: Internal Medicine

## 2012-08-17 ENCOUNTER — Encounter (HOSPITAL_COMMUNITY)
Admission: EM | Disposition: A | Discharge status: Skilled Nursing Facility | Payer: Self-pay | Source: Home / Self Care | Attending: Internal Medicine

## 2012-08-17 ENCOUNTER — Other Ambulatory Visit: Payer: Self-pay

## 2012-08-17 ENCOUNTER — Encounter (HOSPITAL_COMMUNITY): Payer: Self-pay | Admitting: Orthopedic Surgery

## 2012-08-17 ENCOUNTER — Inpatient Hospital Stay (HOSPITAL_COMMUNITY): Payer: Medicare Other | Admitting: Certified Registered Nurse Anesthetist

## 2012-08-17 DIAGNOSIS — Z85118 Personal history of other malignant neoplasm of bronchus and lung: Secondary | ICD-10-CM

## 2012-08-17 DIAGNOSIS — C3490 Malignant neoplasm of unspecified part of unspecified bronchus or lung: Secondary | ICD-10-CM | POA: Diagnosis present

## 2012-08-17 DIAGNOSIS — S7222XA Displaced subtrochanteric fracture of left femur, initial encounter for closed fracture: Secondary | ICD-10-CM

## 2012-08-17 DIAGNOSIS — Z09 Encounter for follow-up examination after completed treatment for conditions other than malignant neoplasm: Secondary | ICD-10-CM

## 2012-08-17 DIAGNOSIS — I509 Heart failure, unspecified: Secondary | ICD-10-CM

## 2012-08-17 DIAGNOSIS — S7223XA Displaced subtrochanteric fracture of unspecified femur, initial encounter for closed fracture: Principal | ICD-10-CM | POA: Diagnosis present

## 2012-08-17 DIAGNOSIS — J449 Chronic obstructive pulmonary disease, unspecified: Secondary | ICD-10-CM

## 2012-08-17 DIAGNOSIS — S72001A Fracture of unspecified part of neck of right femur, initial encounter for closed fracture: Secondary | ICD-10-CM

## 2012-08-17 DIAGNOSIS — E785 Hyperlipidemia, unspecified: Secondary | ICD-10-CM | POA: Diagnosis present

## 2012-08-17 DIAGNOSIS — D649 Anemia, unspecified: Secondary | ICD-10-CM | POA: Diagnosis present

## 2012-08-17 DIAGNOSIS — Z85038 Personal history of other malignant neoplasm of large intestine: Secondary | ICD-10-CM

## 2012-08-17 DIAGNOSIS — I10 Essential (primary) hypertension: Secondary | ICD-10-CM | POA: Diagnosis present

## 2012-08-17 DIAGNOSIS — J4489 Other specified chronic obstructive pulmonary disease: Secondary | ICD-10-CM | POA: Diagnosis present

## 2012-08-17 DIAGNOSIS — S72009A Fracture of unspecified part of neck of unspecified femur, initial encounter for closed fracture: Secondary | ICD-10-CM

## 2012-08-17 DIAGNOSIS — I4949 Other premature depolarization: Secondary | ICD-10-CM | POA: Diagnosis present

## 2012-08-17 DIAGNOSIS — S72142A Displaced intertrochanteric fracture of left femur, initial encounter for closed fracture: Secondary | ICD-10-CM

## 2012-08-17 DIAGNOSIS — C3430 Malignant neoplasm of lower lobe, unspecified bronchus or lung: Secondary | ICD-10-CM

## 2012-08-17 DIAGNOSIS — W010XXA Fall on same level from slipping, tripping and stumbling without subsequent striking against object, initial encounter: Secondary | ICD-10-CM | POA: Diagnosis present

## 2012-08-17 HISTORY — PX: FEMUR FRACTURE SURGERY: SHX633

## 2012-08-17 HISTORY — DX: Chronic obstructive pulmonary disease, unspecified: J44.9

## 2012-08-17 HISTORY — DX: Displaced intertrochanteric fracture of left femur, initial encounter for closed fracture: S72.142A

## 2012-08-17 HISTORY — PX: INTRAMEDULLARY (IM) NAIL INTERTROCHANTERIC: SHX5875

## 2012-08-17 LAB — CBC WITH DIFFERENTIAL/PLATELET
Basophils Relative: 0 % (ref 0–1)
Eosinophils Absolute: 0 10*3/uL (ref 0.0–0.7)
Eosinophils Relative: 0 % (ref 0–5)
Hemoglobin: 14.6 g/dL (ref 13.0–17.0)
MCH: 30 pg (ref 26.0–34.0)
MCHC: 33.8 g/dL (ref 30.0–36.0)
MCV: 88.7 fL (ref 78.0–100.0)
Monocytes Absolute: 0.6 10*3/uL (ref 0.1–1.0)
Monocytes Relative: 5 % (ref 3–12)
Neutrophils Relative %: 88 % — ABNORMAL HIGH (ref 43–77)

## 2012-08-17 LAB — HEPATIC FUNCTION PANEL
ALT: 14 U/L (ref 0–53)
Alkaline Phosphatase: 67 U/L (ref 39–117)
Bilirubin, Direct: 0.1 mg/dL (ref 0.0–0.3)
Indirect Bilirubin: 0.4 mg/dL (ref 0.3–0.9)

## 2012-08-17 LAB — BASIC METABOLIC PANEL
BUN: 12 mg/dL (ref 6–23)
CO2: 31 mEq/L (ref 19–32)
GFR calc non Af Amer: 80 mL/min — ABNORMAL LOW (ref >90)
Glucose, Bld: 143 mg/dL — ABNORMAL HIGH (ref 70–99)
Potassium: 4.6 mEq/L (ref 3.5–5.1)

## 2012-08-17 LAB — URINALYSIS, ROUTINE W REFLEX MICROSCOPIC
Bilirubin Urine: NEGATIVE
Glucose, UA: NEGATIVE mg/dL
Hgb urine dipstick: NEGATIVE
Protein, ur: 100 mg/dL — AB
Urobilinogen, UA: 1 mg/dL (ref 0.0–1.0)

## 2012-08-17 LAB — PROTIME-INR: INR: 1.05 (ref 0.00–1.49)

## 2012-08-17 SURGERY — FIXATION, FRACTURE, INTERTROCHANTERIC, WITH INTRAMEDULLARY ROD
Anesthesia: General | Site: Hip | Laterality: Left | Wound class: Clean

## 2012-08-17 MED ORDER — CALCIUM CARBONATE-VITAMIN D 500-200 MG-UNIT PO TABS: 1.0000 | ORAL_TABLET | Freq: Every day | ORAL | Status: DC

## 2012-08-17 MED ORDER — ACETAMINOPHEN 650 MG RE SUPP: 650.0000 mg | Freq: Four times a day (QID) | RECTAL | Status: DC | PRN

## 2012-08-17 MED ORDER — POTASSIUM CHLORIDE IN NACL 20-0.45 MEQ/L-% IV SOLN
INTRAVENOUS | Status: DC
  Administered 2012-08-17: via INTRAVENOUS
  Filled 2012-08-17 (×2): qty 1000

## 2012-08-17 MED ORDER — ONDANSETRON HCL 4 MG PO TABS: 4.0000 mg | ORAL_TABLET | Freq: Four times a day (QID) | ORAL | Status: DC | PRN

## 2012-08-17 MED ORDER — NEOSTIGMINE METHYLSULFATE 1 MG/ML IJ SOLN
INTRAMUSCULAR | Status: DC | PRN
  Administered 2012-08-17: 3 mg via INTRAVENOUS

## 2012-08-17 MED ORDER — ACETAMINOPHEN 10 MG/ML IV SOLN
INTRAVENOUS | Status: DC | PRN
  Administered 2012-08-17: 1000 mg via INTRAVENOUS

## 2012-08-17 MED ORDER — LEVOTHYROXINE SODIUM 112 MCG PO TABS
112.0000 ug | ORAL_TABLET | Freq: Every day | ORAL | Status: DC
  Administered 2012-08-18 – 2012-08-22 (×5): 112 ug via ORAL
  Filled 2012-08-17 (×7): qty 1

## 2012-08-17 MED ORDER — METOCLOPRAMIDE HCL 10 MG PO TABS: 5.0000 mg | ORAL_TABLET | Freq: Three times a day (TID) | ORAL | Status: DC | PRN

## 2012-08-17 MED ORDER — METOCLOPRAMIDE HCL 5 MG/ML IJ SOLN: 5.0000 mg | Freq: Three times a day (TID) | INTRAMUSCULAR | Status: DC | PRN

## 2012-08-17 MED ORDER — GLYCOPYRROLATE 0.2 MG/ML IJ SOLN
INTRAMUSCULAR | Status: DC | PRN
  Administered 2012-08-17: .5 mg via INTRAVENOUS

## 2012-08-17 MED ORDER — SORBITOL 70 % SOLN
30.0000 mL | Freq: Every day | Status: DC | PRN
  Administered 2012-08-19: 30 mL via ORAL
  Filled 2012-08-17: qty 30

## 2012-08-17 MED ORDER — ACETAMINOPHEN 10 MG/ML IV SOLN
1000.0000 mg | Freq: Four times a day (QID) | INTRAVENOUS | Status: AC
  Administered 2012-08-18 (×3): 1000 mg via INTRAVENOUS
  Filled 2012-08-17 (×4): qty 100

## 2012-08-17 MED ORDER — HYDROMORPHONE HCL PF 1 MG/ML IJ SOLN: 0.2500 mg | INTRAMUSCULAR | Status: DC | PRN

## 2012-08-17 MED ORDER — PHENOL 1.4 % MT LIQD: 1.0000 | OROMUCOSAL | Status: DC | PRN

## 2012-08-17 MED ORDER — SUCCINYLCHOLINE CHLORIDE 20 MG/ML IJ SOLN
INTRAMUSCULAR | Status: AC
  Filled 2012-08-17: qty 1

## 2012-08-17 MED ORDER — ONDANSETRON HCL 4 MG/2ML IJ SOLN: 4.0000 mg | Freq: Once | INTRAMUSCULAR | Status: DC

## 2012-08-17 MED ORDER — SENNA-DOCUSATE SODIUM 8.6-50 MG PO TABS: 1.0000 | ORAL_TABLET | Freq: Every day | ORAL | Status: DC

## 2012-08-17 MED ORDER — ETOMIDATE 2 MG/ML IV SOLN
INTRAVENOUS | Status: AC
  Filled 2012-08-17: qty 20

## 2012-08-17 MED ORDER — ONDANSETRON HCL 4 MG/2ML IJ SOLN: 4.0000 mg | Freq: Four times a day (QID) | INTRAMUSCULAR | Status: DC | PRN

## 2012-08-17 MED ORDER — ACETAMINOPHEN 10 MG/ML IV SOLN
1000.0000 mg | Freq: Four times a day (QID) | INTRAVENOUS | Status: DC
  Administered 2012-08-17: 1000 mg via INTRAVENOUS
  Filled 2012-08-17 (×4): qty 100

## 2012-08-17 MED ORDER — ENOXAPARIN SODIUM 40 MG/0.4ML ~~LOC~~ SOLN: 40.0000 mg | SUBCUTANEOUS | Status: DC

## 2012-08-17 MED ORDER — CEFAZOLIN SODIUM-DEXTROSE 2-3 GM-% IV SOLR
2.0000 g | Freq: Four times a day (QID) | INTRAVENOUS | Status: AC
  Administered 2012-08-18 (×2): 2 g via INTRAVENOUS
  Filled 2012-08-17 (×2): qty 50

## 2012-08-17 MED ORDER — LIDOCAINE HCL (CARDIAC) 20 MG/ML IV SOLN
INTRAVENOUS | Status: DC | PRN
  Administered 2012-08-17: 100 mg via INTRAVENOUS

## 2012-08-17 MED ORDER — ENOXAPARIN SODIUM 40 MG/0.4ML ~~LOC~~ SOLN
40.0000 mg | SUBCUTANEOUS | Status: DC
  Filled 2012-08-17: qty 0.4

## 2012-08-17 MED ORDER — SODIUM CHLORIDE 0.9 % IJ SOLN: 3.0000 mL | Freq: Two times a day (BID) | INTRAMUSCULAR | Status: DC

## 2012-08-17 MED ORDER — SENNA 8.6 MG PO TABS
1.0000 | ORAL_TABLET | Freq: Two times a day (BID) | ORAL | Status: DC
  Administered 2012-08-18 – 2012-08-22 (×8): 8.6 mg via ORAL
  Filled 2012-08-17 (×10): qty 1

## 2012-08-17 MED ORDER — HYDROCODONE-ACETAMINOPHEN 5-325 MG PO TABS
1.0000 | ORAL_TABLET | Freq: Four times a day (QID) | ORAL | Status: DC | PRN
  Administered 2012-08-18: 2 via ORAL
  Administered 2012-08-19: 1 via ORAL
  Administered 2012-08-20: 2 via ORAL
  Filled 2012-08-17: qty 1
  Filled 2012-08-17 (×2): qty 2

## 2012-08-17 MED ORDER — ACETAMINOPHEN 325 MG PO TABS: 650.0000 mg | ORAL_TABLET | Freq: Four times a day (QID) | ORAL | Status: AC | PRN

## 2012-08-17 MED ORDER — MEPERIDINE HCL 25 MG/ML IJ SOLN: 6.2500 mg | INTRAMUSCULAR | Status: DC | PRN

## 2012-08-17 MED ORDER — SODIUM CHLORIDE 0.9 % IV SOLN: INTRAVENOUS | Status: DC

## 2012-08-17 MED ORDER — SODIUM CHLORIDE 0.9 % IV SOLN
10.0000 mg | INTRAVENOUS | Status: DC | PRN
  Administered 2012-08-17: 50 ug/min via INTRAVENOUS

## 2012-08-17 MED ORDER — PROPOFOL 10 MG/ML IV BOLUS
INTRAVENOUS | Status: DC | PRN
  Administered 2012-08-17: 100 mg via INTRAVENOUS

## 2012-08-17 MED ORDER — FENTANYL CITRATE 0.05 MG/ML IJ SOLN
50.0000 ug | Freq: Once | INTRAMUSCULAR | Status: DC
Start: 2012-08-17 — End: 2012-08-17

## 2012-08-17 MED ORDER — ENOXAPARIN SODIUM 40 MG/0.4ML ~~LOC~~ SOLN
40.0000 mg | SUBCUTANEOUS | Status: DC
  Administered 2012-08-18 – 2012-08-22 (×5): 40 mg via SUBCUTANEOUS
  Filled 2012-08-17 (×5): qty 0.4

## 2012-08-17 MED ORDER — ROCURONIUM BROMIDE 100 MG/10ML IV SOLN
INTRAVENOUS | Status: DC | PRN
  Administered 2012-08-17: 10 mg via INTRAVENOUS
  Administered 2012-08-17: 30 mg via INTRAVENOUS
  Administered 2012-08-17: 10 mg via INTRAVENOUS

## 2012-08-17 MED ORDER — 0.9 % SODIUM CHLORIDE (POUR BTL) OPTIME
TOPICAL | Status: DC | PRN
  Administered 2012-08-17: 1000 mL

## 2012-08-17 MED ORDER — EPHEDRINE SULFATE 50 MG/ML IJ SOLN
INTRAMUSCULAR | Status: DC | PRN
  Administered 2012-08-17: 20 mg via INTRAVENOUS

## 2012-08-17 MED ORDER — OXYCODONE HCL 5 MG PO TABS
5.0000 mg | ORAL_TABLET | Freq: Once | ORAL | Status: DC | PRN
Start: 2012-08-17 — End: 2012-08-17

## 2012-08-17 MED ORDER — LACTATED RINGERS IV SOLN
INTRAVENOUS | Status: DC | PRN
  Administered 2012-08-17 (×2): via INTRAVENOUS

## 2012-08-17 MED ORDER — ALUM & MAG HYDROXIDE-SIMETH 200-200-20 MG/5ML PO SUSP: 30.0000 mL | ORAL | Status: DC | PRN

## 2012-08-17 MED ORDER — CEFAZOLIN SODIUM-DEXTROSE 2-3 GM-% IV SOLR
2.0000 g | INTRAVENOUS | Status: DC
  Filled 2012-08-17: qty 50

## 2012-08-17 MED ORDER — ACETAMINOPHEN 325 MG PO TABS: 650.0000 mg | ORAL_TABLET | Freq: Four times a day (QID) | ORAL | Status: DC | PRN

## 2012-08-17 MED ORDER — SODIUM CHLORIDE 0.9 % IV SOLN
1000.0000 mL | INTRAVENOUS | Status: DC
  Administered 2012-08-17: 1000 mL via INTRAVENOUS

## 2012-08-17 MED ORDER — DOCUSATE SODIUM 100 MG PO CAPS: 100.0000 mg | ORAL_CAPSULE | Freq: Two times a day (BID) | ORAL | Status: DC

## 2012-08-17 MED ORDER — ONDANSETRON HCL 4 MG/2ML IJ SOLN: 4.0000 mg | Freq: Once | INTRAMUSCULAR | Status: DC | PRN

## 2012-08-17 MED ORDER — CEFAZOLIN SODIUM-DEXTROSE 2-3 GM-% IV SOLR
INTRAVENOUS | Status: AC
  Filled 2012-08-17: qty 100

## 2012-08-17 MED ORDER — ONE-DAILY MULTI VITAMINS PO TABS: 1.0000 | ORAL_TABLET | Freq: Every day | ORAL | Status: DC

## 2012-08-17 MED ORDER — ROCURONIUM BROMIDE 50 MG/5ML IV SOLN
INTRAVENOUS | Status: AC
  Filled 2012-08-17: qty 2

## 2012-08-17 MED ORDER — LEVALBUTEROL HCL 0.63 MG/3ML IN NEBU
0.6300 mg | INHALATION_SOLUTION | Freq: Four times a day (QID) | RESPIRATORY_TRACT | Status: DC | PRN
  Filled 2012-08-17: qty 3

## 2012-08-17 MED ORDER — MENTHOL 3 MG MT LOZG: 1.0000 | LOZENGE | OROMUCOSAL | Status: DC | PRN

## 2012-08-17 MED ORDER — MORPHINE SULFATE 2 MG/ML IJ SOLN: 0.5000 mg | INTRAMUSCULAR | Status: DC | PRN

## 2012-08-17 MED ORDER — PHENYLEPHRINE HCL 10 MG/ML IJ SOLN
INTRAMUSCULAR | Status: DC | PRN
  Administered 2012-08-17: 40 ug via INTRAVENOUS
  Administered 2012-08-17: 80 ug via INTRAVENOUS

## 2012-08-17 MED ORDER — ACETAMINOPHEN 10 MG/ML IV SOLN
INTRAVENOUS | Status: AC
  Filled 2012-08-17: qty 100

## 2012-08-17 MED ORDER — LIDOCAINE HCL (CARDIAC) 20 MG/ML IV SOLN
INTRAVENOUS | Status: AC
  Filled 2012-08-17: qty 5

## 2012-08-17 MED ORDER — BISACODYL 5 MG PO TBEC: 5.0000 mg | DELAYED_RELEASE_TABLET | Freq: Every day | ORAL | Status: DC | PRN

## 2012-08-17 MED ORDER — POLYETHYLENE GLYCOL 3350 17 G PO PACK
17.0000 g | PACK | Freq: Every day | ORAL | Status: DC | PRN
  Filled 2012-08-17: qty 1

## 2012-08-17 MED ORDER — OXYCODONE HCL 5 MG/5ML PO SOLN: 5.0000 mg | Freq: Once | ORAL | Status: DC | PRN

## 2012-08-17 MED ORDER — DOCUSATE SODIUM 100 MG PO CAPS
100.0000 mg | ORAL_CAPSULE | Freq: Two times a day (BID) | ORAL | Status: DC
  Administered 2012-08-18 – 2012-08-22 (×9): 100 mg via ORAL
  Filled 2012-08-17 (×10): qty 1

## 2012-08-17 MED ORDER — DEXTROSE 5 % IV SOLN
INTRAVENOUS | Status: DC | PRN
  Administered 2012-08-17: 20:00:00 via INTRAVENOUS

## 2012-08-17 MED ORDER — CEFAZOLIN SODIUM-DEXTROSE 2-3 GM-% IV SOLR
INTRAVENOUS | Status: DC | PRN
  Administered 2012-08-17: 2 g via INTRAVENOUS

## 2012-08-17 MED ORDER — ADULT MULTIVITAMIN W/MINERALS CH
1.0000 | ORAL_TABLET | Freq: Every day | ORAL | Status: DC
  Administered 2012-08-18 – 2012-08-22 (×5): 1 via ORAL
  Filled 2012-08-17 (×6): qty 1

## 2012-08-17 MED ORDER — HYDROCODONE-ACETAMINOPHEN 5-325 MG PO TABS: 1.0000 | ORAL_TABLET | ORAL | Status: DC | PRN

## 2012-08-17 MED ORDER — ACETAMINOPHEN 325 MG PO TABS
650.0000 mg | ORAL_TABLET | Freq: Four times a day (QID) | ORAL | Status: DC | PRN
  Administered 2012-08-20 – 2012-08-21 (×2): 650 mg via ORAL
  Filled 2012-08-17 (×2): qty 2

## 2012-08-17 MED ORDER — SUCCINYLCHOLINE CHLORIDE 20 MG/ML IJ SOLN
INTRAMUSCULAR | Status: DC | PRN
  Administered 2012-08-17: 100 mg via INTRAVENOUS

## 2012-08-17 MED ORDER — ONDANSETRON HCL 4 MG/2ML IJ SOLN
INTRAMUSCULAR | Status: DC | PRN
  Administered 2012-08-17: 4 mg via INTRAVENOUS

## 2012-08-17 MED ORDER — FENTANYL CITRATE 0.05 MG/ML IJ SOLN
INTRAMUSCULAR | Status: DC | PRN
  Administered 2012-08-17: 100 ug via INTRAVENOUS
  Administered 2012-08-17: 50 ug via INTRAVENOUS

## 2012-08-17 MED ORDER — ATORVASTATIN CALCIUM 40 MG PO TABS
40.0000 mg | ORAL_TABLET | Freq: Every day | ORAL | Status: DC
  Administered 2012-08-18 – 2012-08-21 (×4): 40 mg via ORAL
  Filled 2012-08-17 (×6): qty 1

## 2012-08-17 MED ORDER — HYDROCODONE-ACETAMINOPHEN 5-325 MG PO TABS: 1.0000 | ORAL_TABLET | Freq: Four times a day (QID) | ORAL | Status: DC | PRN

## 2012-08-17 MED ORDER — LORATADINE 10 MG PO TABS
10.0000 mg | ORAL_TABLET | Freq: Every day | ORAL | Status: DC
  Administered 2012-08-18 – 2012-08-22 (×5): 10 mg via ORAL
  Filled 2012-08-17 (×6): qty 1

## 2012-08-17 SURGICAL SUPPLY — 43 items
11.0 MM TITANIUM HELICAL BLADE ×2 IMPLANT
BENZOIN TINCTURE PRP APPL 2/3 (GAUZE/BANDAGES/DRESSINGS) IMPLANT
BIT DRILL 4.0X195MM (BIT) ×1 IMPLANT
BOOTCOVER CLEANROOM LRG (PROTECTIVE WEAR) ×4 IMPLANT
CLOTH BEACON ORANGE TIMEOUT ST (SAFETY) ×2 IMPLANT
CLSR STERI-STRIP ANTIMIC 1/2X4 (GAUZE/BANDAGES/DRESSINGS) ×4 IMPLANT
COVER SURGICAL LIGHT HANDLE (MISCELLANEOUS) ×2 IMPLANT
DRAPE INCISE IOBAN 66X45 STRL (DRAPES) ×2 IMPLANT
DRAPE STERI IOBAN 125X83 (DRAPES) ×2 IMPLANT
DRILL BIT 4.0X195MM (BIT) ×1
DRSG MEPILEX BORDER 4X4 (GAUZE/BANDAGES/DRESSINGS) ×4 IMPLANT
DRSG MEPILEX BORDER 4X8 (GAUZE/BANDAGES/DRESSINGS) IMPLANT
DURAPREP 26ML APPLICATOR (WOUND CARE) ×2 IMPLANT
ELECT CAUTERY BLADE 6.4 (BLADE) ×2 IMPLANT
ELECT REM PT RETURN 9FT ADLT (ELECTROSURGICAL) ×2
ELECTRODE REM PT RTRN 9FT ADLT (ELECTROSURGICAL) ×1 IMPLANT
EVACUATOR 1/8 PVC DRAIN (DRAIN) IMPLANT
FACESHIELD LNG OPTICON STERILE (SAFETY) IMPLANT
GAUZE XEROFORM 5X9 LF (GAUZE/BANDAGES/DRESSINGS) IMPLANT
GLOVE BIOGEL PI IND STRL 8 (GLOVE) ×2 IMPLANT
GLOVE BIOGEL PI INDICATOR 8 (GLOVE) ×2
GLOVE ORTHO TXT STRL SZ7.5 (GLOVE) ×4 IMPLANT
GLOVE SURG ORTHO 8.0 STRL STRW (GLOVE) ×4 IMPLANT
GOWN STRL NON-REIN LRG LVL3 (GOWN DISPOSABLE) ×6 IMPLANT
GUIDEWIRE 3.2X400 (WIRE) ×4 IMPLANT
KIT ROOM TURNOVER OR (KITS) ×2 IMPLANT
MANIFOLD NEPTUNE II (INSTRUMENTS) ×2 IMPLANT
NAIL TROCH FIXATION 11MM/130 (Orthopedic Implant) ×2 IMPLANT
NS IRRIG 1000ML POUR BTL (IV SOLUTION) ×2 IMPLANT
PACK GENERAL/GYN (CUSTOM PROCEDURE TRAY) ×2 IMPLANT
PAD ARMBOARD 7.5X6 YLW CONV (MISCELLANEOUS) ×4 IMPLANT
SCREW LOCK TI 5.0X50 F/IM NAIL (Screw) ×2 IMPLANT
STAPLER VISISTAT 35W (STAPLE) ×2 IMPLANT
STRIP CLOSURE SKIN 1/2X4 (GAUZE/BANDAGES/DRESSINGS) IMPLANT
SUT MNCRL AB 4-0 PS2 18 (SUTURE) ×2 IMPLANT
SUT VIC AB 0 CTB1 27 (SUTURE) ×2 IMPLANT
SUT VIC AB 2-0 FS1 27 (SUTURE) IMPLANT
SUT VIC AB 2-0 SH 27 (SUTURE)
SUT VIC AB 2-0 SH 27XBRD (SUTURE) IMPLANT
SUT VIC AB 3-0 SH 8-18 (SUTURE) ×2 IMPLANT
TOWEL OR 17X24 6PK STRL BLUE (TOWEL DISPOSABLE) ×2 IMPLANT
TOWEL OR 17X26 10 PK STRL BLUE (TOWEL DISPOSABLE) ×2 IMPLANT
WATER STERILE IRR 1000ML POUR (IV SOLUTION) IMPLANT

## 2012-08-17 NOTE — ED Notes (Signed)
Per report from [facility] pt was found outside at home.  He reportedly slipped while walking.  He is noted to have a altered LOC (alert to person, and place) not oriented to time or year.  He is noted to have swelling to his L hip with shortening and external rotation to his L leg.  + pedal pulses present bilaterally.

## 2012-08-17 NOTE — ED Notes (Signed)
Report to [facility]- Patient transporting to echo then will be taken to CDU.

## 2012-08-17 NOTE — ED Notes (Signed)
Pt has been transported to [address] [place]

## 2012-08-17 NOTE — Op Note (Addendum)
DATE OF SURGERY:  08/17/2012  TIME: 9:50 PM  PATIENT NAME:  [name]  AGE: 78 y.o.  PRE-OPERATIVE DIAGNOSIS:  left four-part subtrochanteric/intertrochanteric hip fracture  POST-OPERATIVE DIAGNOSIS:  SAME  PROCEDURE:  INTRAMEDULLARY (IM) NAIL LEFT SUBTROCHANTERIC FEMUR FRACTURE  SURGEON:  Alura Olveda P  ASSISTANT:  Janace Litten, OPA-C, present and scrubbed throughout the case, critical for assistance with exposure, retraction, instrumentation, and closure.  OPERATIVE IMPLANTS: Synthes trochanteric femoral nail with interlocking helical blade into the femoral head and a distal interlocking bolt, placed in the dynamic slot, but in a nearly static position. The length of the nail was 420 mm, with a 115 mm helical blade.Marland Kitchen  PREOPERATIVE INDICATIONS:  [name] is a 78 y.o. year old who fell and suffered a hip fracture. He was brought into the ER and then admitted and optimized and then elected for surgical intervention.    The risks benefits and alternatives were discussed with the patient including but not limited to the risks of nonoperative treatment, versus surgical intervention including infection, bleeding, nerve injury, malunion, nonunion, hardware prominence, hardware failure, need for hardware removal, blood clots, cardiopulmonary complications, morbidity, mortality, among others, and they were willing to proceed.  He has a previous history of lung cancer, colon cancer, small bowel obstruction, with some early dementia, and sensitivity to narcotics from a mental status and a GI standpoint.  OPERATIVE PROCEDURE:  The patient was brought to the operating room and placed in the supine position. General anesthesia was administered, with a foley. He was placed on the fracture table.  Closed reduction was performed under C-arm guidance. The length of the femur was also measured using fluoroscopy. Time out was then performed after sterile prep and drape. He received  preoperative antibiotics. The fracture was in a subtrochanteric-type pattern, although the greater trochanter was comminuted in multiple segments.  Incision was made proximal to the greater trochanter. A guidewire was placed in the appropriate position. Confirmation was made on AP and lateral views. The placement of this guidewire was extremely challenging, given the comminuted segment of the proximal femur, and the fact that I really have no control over the proximal femur. Nonetheless I was able to get satisfactory position.   The above-named nail was opened. I opened the proximal femur with a reamer. I then placed the nail by hand down. I did not need to ream the femur. I did however have some cortical interference fit, but was able to get past the isthmus.  Once the nail was completely seated, I placed a guidepin into the femoral head into the center center position. I ended up just slightly low, and posterior, and had satisfactory position and the head. I measured the length, and then reamed the lateral cortex and up into the head. I then placed the helical blade. Slight compression was applied. Anatomic fixation achieved. Bone quality was mediocre.  I then secured the proximal interlocking bolt, and took off a half a turn, and then removed the instruments, and took final C-arm pictures AP and lateral the entire length of the leg. Anatomic reconstruction was achieved, I then interlocked the nail distally, placing the distal screw into the dynamic slot, because this was a little bit  More distal in the shaft in order to minimize stress risers distally, but I did want to control the length of the femur, so I placed it in a nearly static position. This would allow for some slight compression if necessary, but not enough to result and  leg length discrepancy.  The wounds were irrigated copiously and closed with Vicryl followed by Steri-Strips and sterile gauze for the skin. The patient was awakened and  returned to PACU in stable and satisfactory condition. There no complications and the patient tolerated the procedure well.  He will be weightbearing as tolerated, and will be on Lovenox for a period of three weeks for DVT prophylaxis.  Teryl Lucy, M.D.

## 2012-08-17 NOTE — Anesthesia Postprocedure Evaluation (Signed)
Anesthesia Post Note  Patient: [name]  Procedure(s) Performed: Procedure(s) (LRB): INTRAMEDULLARY (IM) NAIL INTERTROCHANTRIC (Left)  Anesthesia type: general  Patient location: PACU  Post pain: Pain level controlled  Post assessment: Patient's Cardiovascular Status Stable  Last Vitals:  Filed Vitals:   08/17/12 2214  BP: 142/96  Pulse: 80  Temp: 36.4 C  Resp: 18    Post vital signs: Reviewed and stable  Level of consciousness: sedated  Complications: No apparent anesthesia complications

## 2012-08-17 NOTE — Progress Notes (Signed)
Utilization review completed.  P.J. Adorian Gwynne,RN,BSN Case Manager [phone]  

## 2012-08-17 NOTE — Anesthesia Preprocedure Evaluation (Addendum)
Anesthesia Evaluation  Patient identified by MRN, date of birth, ID band Patient confused    Reviewed: Allergy & Precautions, H&P , NPO status , Patient's Chart, lab work & pertinent test results  Airway Mallampati: I TM Distance: >3 FB Neck ROM: Limited    Dental  (+) Edentulous Upper and Edentulous Lower   Pulmonary COPD Hx: lung Ca         Cardiovascular hypertension, Pt. on medications     Neuro/Psych Pt. somewhat confused but follows directions well.    GI/Hepatic Hx of Colon cancer   Endo/Other    Renal/GU      Musculoskeletal   Abdominal   Peds  Hematology   Anesthesia Other Findings   Reproductive/Obstetrics                         Anesthesia Physical Anesthesia Plan  ASA: III  Anesthesia Plan: General   Post-op Pain Management:    Induction: Intravenous  Airway Management Planned: Oral ETT  Additional Equipment:   Intra-op Plan:   Post-operative Plan: Extubation in OR  Informed Consent: I have reviewed the patients History and Physical, chart, labs and discussed the procedure including the risks, benefits and alternatives for the proposed anesthesia with the patient or authorized representative who has indicated his/her understanding and acceptance.   Dental advisory given and History available from chart only  Plan Discussed with: CRNA and Surgeon  Anesthesia Plan Comments:        Anesthesia Quick Evaluation

## 2012-08-17 NOTE — Preoperative (Signed)
Beta Blockers   Reason not to administer Beta Blockers:Not Applicable 

## 2012-08-17 NOTE — ED Notes (Signed)
MD at bedside. 

## 2012-08-17 NOTE — Transfer of Care (Signed)
Immediate Anesthesia Transfer of Care Note  Patient: [name]  Procedure(s) Performed: Procedure(s) (LRB) with comments: INTRAMEDULLARY (IM) NAIL INTERTROCHANTRIC (Left) - Synthes GSN, Fracture Table, Big C arm  Patient Location: PACU  Anesthesia Type:General  Level of Consciousness: awake and alert   Airway & Oxygen Therapy: Patient Spontanous Breathing and Patient connected to nasal cannula oxygen  Post-op Assessment: Report given to PACU RN and Post -op Vital signs reviewed and stable  Post vital signs: Reviewed and stable  Complications: No apparent anesthesia complications

## 2012-08-17 NOTE — Anesthesia Procedure Notes (Signed)
Procedure Name: Intubation Date/Time: 08/17/2012 8:09 PM Performed by: Julianne Rice Z Pre-anesthesia Checklist: Patient identified, Emergency Drugs available, Suction available, Patient being monitored and Timeout performed Patient Re-evaluated:Patient Re-evaluated prior to inductionOxygen Delivery Method: Circle system utilized Intubation Type: IV induction and Rapid sequence Laryngoscope Size: Mac and 4 Grade View: Grade I Tube type: Oral Tube size: 8.0 mm Number of attempts: 1 Airway Equipment and Method: Stylet Placement Confirmation: ETT inserted through vocal cords under direct vision,  breath sounds checked- equal and bilateral and positive ETCO2 (Intubated by r. Ossey) Secured at: 23 cm Tube secured with: Tape Dental Injury: Teeth and Oropharynx as per pre-operative assessment  Comments: Intubated easily by Dr. Michelle Piper

## 2012-08-17 NOTE — ED Provider Notes (Signed)
History     CSN: [id number]  Arrival date & time 08/17/12  1028   First MD Initiated Contact with Patient 08/17/12 1041      Chief Complaint  Patient presents with  . Fall  . Hip Pain    (Consider location/radiation/quality/duration/timing/severity/associated sxs/prior treatment) HPI Comments: Limited Hx from pt due to dementia.  Patient is a 78 y.o. male presenting with hip pain. The history is provided by the patient. No language interpreter was used.  Hip Pain This is a new problem. The current episode started less than 1 hour ago (The patient fell on sleep ground and injured his left hip. He could not get up. EMS therefore brought him to [facility] for evaluation.). The problem has not changed since onset.Associated symptoms comments: None.. Exacerbated by: Movement. Nothing relieves the symptoms. Treatments tried: Patient was [facility] for evaluation by EMS.    Past Medical History  Diagnosis Date  . Hyperlipidemia   . Hypertension   . Allergy   . Fractured shoulder 8/30?    pt fell about 3 weeks ago and fractured shoulder blade   . colon ca dx'd 1994    chemo  . Lung cancer dx'd 07/2007    lu-lobectomy    Past Surgical History  Procedure Date  . Colon surgery   . Colostomy takedown   . Colostomy revision   . Hernia repair     Family History  Problem Relation Age of Onset  . Stroke Mother   . Heart disease Brother     History  Substance Use Topics  . Smoking status: Never Smoker   . Smokeless tobacco: Not on file  . Alcohol Use: No      Review of Systems  Unable to perform ROS: Dementia    Allergies  Morphine and related and Codeine  Home Medications   Current Outpatient Rx  Name  Route  Sig  Dispense  Refill  . ATORVASTATIN CALCIUM 40 MG PO TABS   Oral   Take 40 mg by mouth daily.           Marland Kitchen LEVOXYL PO   Oral   Take 112 mcg by mouth.          Marland Kitchen LORATADINE 10 MG PO TABS   Oral   Take 10 mg by  mouth daily.         Marland Kitchen ONE-DAILY MULTI VITAMINS PO TABS   Oral   Take 1 tablet by mouth daily.         . TRIAMTERENE-HCTZ 50-25 MG PO CAPS   Oral   Take 1 capsule by mouth daily.           BP 147/71  Pulse 82  Temp 97.5 F (36.4 C) (Oral)  Resp 16  Ht 6' (1.829 m)  Wt 220 lb (99.791 kg)  BMI 29.84 kg/m2  SpO2 98%  Physical Exam  Nursing note and vitals reviewed. Constitutional: He appears well-developed and well-nourished.  HENT:  Head: Normocephalic and atraumatic.  Right Ear: External ear normal.  Left Ear: External ear normal.  Mouth/Throat: Oropharynx is clear and moist.  Eyes: Conjunctivae normal and EOM are normal. Pupils are equal, round, and reactive to light.  Neck: Normal range of motion. Neck supple.  Cardiovascular: Normal rate, regular rhythm and normal heart sounds.   Pulmonary/Chest: Effort normal and breath sounds normal.  Abdominal:        Oral lower abdominal scar and left midabdominal colostomy scars present  and well healed. No abdominal mass or tenderness.  Musculoskeletal:       Left leg shortened and externally rotated.  Neurological: He is alert.       No sensory or motor deficit.  Skin: Skin is warm and dry.  Psychiatric: He has a normal mood and affect. His behavior is normal.    ED Course  Procedures (including critical care time)   Labs Reviewed  BASIC METABOLIC PANEL  CBC WITH DIFFERENTIAL  PROTIME-INR  TYPE AND SCREEN   11:03 AM Pt seen --> physical exam performed.  IV meds for pain and nausea ordered.  Lab workup for hip fracture ordered.  Old charts reviewed.  No mention of allergy in recent old charts--wife says that morphine makes him crazy.  11:04 AM  Date: 08/17/2012  Rate:85  Rhythm: normal sinus rhythm  QRS Axis: left  Intervals: PR prolonged and QT prolonged  ST/T Wave abnormalities: normal  Conduction Disutrbances:none  Narrative Interpretation: Abnormal EKG.  Old EKG Reviewed: unchanged  12:29  PM X-rays show comminuted left intertrochanteric fracture.  Family requests Dr. Thurston Hole for orthopedist; Dr. Thurston Hole had done pt's son's total knee operation.  12:39 PM Dr. Thurston Hole is out of town. His partner, Dr. Dion Saucier, will see pt.  1:10 PM Discussed with Dr. Susie Cassette.  Admit to telemetry to [facility] team 6.     1. Fracture of right hip          Carleene Cooper III, MD 08/17/12 1311

## 2012-08-17 NOTE — Progress Notes (Signed)
  Echocardiogram 2D Echocardiogram has been performed.  [name] 08/17/2012, 3:10 PM

## 2012-08-17 NOTE — Consult Note (Signed)
ORTHOPAEDIC CONSULTATION  REQUESTING PHYSICIAN: Richarda Overlie, MD  Chief Complaint: Left hip pain  HPI: [name] is a 78 y.o. male who complains of  left hip pain after a mechanical fall today while walking outside. He was using his cane, and has had previous falls before. He had acute severe pain over the left hip, unable to walk, and was brought to the emergency room. He had no other injuries in the fall, no loss of consciousness. He normally lives with his family, and walks with a cane, on occasion, many times walking independently.  He has a past history of a right ankle fracture, that was treated surgically, but has had ongoing problems with the right lower extremity and difficulty with walking.  He has had a fairly complex history of bowel problems, require multiple surgeries, and is very sensitive to narcotics from a mental status standpoint as well as GI standpoint.    Past Medical History  Diagnosis Date  . Hyperlipidemia   . Hypertension   . Allergy   . Fractured shoulder 8/30?    pt fell about 3 weeks ago and fractured shoulder blade   . colon ca dx'd 1994    chemo  . Lung cancer dx'd 07/2007    lu-lobectomy  . Fracture, intertrochanteric, left femur 08/17/2012   Past Surgical History  Procedure Date  . Colon surgery   . Colostomy takedown   . Colostomy revision   . Hernia repair    History   Social History  . Marital Status: Married    Spouse Name: N/A    Number of Children: N/A  . Years of Education: N/A   Social History Main Topics  . Smoking status: Never Smoker   . Smokeless tobacco: Not on file  . Alcohol Use: No  . Drug Use: No  . Sexually Active:    Other Topics Concern  . Not on file   Social History Narrative  . No narrative on file   Family History  Problem Relation Age of Onset  . Stroke Mother   . Heart disease Brother    Allergies  Allergen Reactions  . Morphine And Related Other (See Comments)    hallucinations  .  Codeine     Pt is allergic to pain medications including morphine    Prior to Admission medications   Medication Sig Start Date End Date Taking? Authorizing Provider  atorvastatin (LIPITOR) 40 MG tablet Take 40 mg by mouth daily.     Yes Historical Provider, MD  levothyroxine (SYNTHROID, LEVOTHROID) 112 MCG tablet Take 112 mcg by mouth daily.   Yes Historical Provider, MD  loratadine (CLARITIN) 10 MG tablet Take 10 mg by mouth daily.   Yes Historical Provider, MD  Multiple Vitamin (MULTIVITAMIN) tablet Take 1 tablet by mouth daily.   Yes Historical Provider, MD  triamterene-hydrochlorothiazide (DYAZIDE) 50-25 MG per capsule Take 1 capsule by mouth daily.   Yes Historical Provider, MD   Dg Chest 1 View  08/17/2012  *RADIOLOGY REPORT*  Clinical Data: Fall.  History of lobectomy.  CHEST - 1 VIEW  Comparison: Chest CT from 08/12/2012  Findings: The lungs are clear without focal infiltrate, edema, pneumothorax or pleural effusion. Interstitial markings are diffusely coarsened with chronic features. Cardiopericardial silhouette is at upper limits of normal for size.  Bones are diffusely demineralized. Telemetry leads overlie the chest.  Gas distended bowel loops are noted in the visualized upper abdomen.  IMPRESSION: Borderline cardiomegaly with chronic interstitial coarsening but no  acute cardiopulmonary findings.   Original Report Authenticated By: Kennith Center, M.D.    Dg Hip Complete Left  08/17/2012  *RADIOLOGY REPORT*  Clinical Data: Left hip pain  LEFT HIP - COMPLETE 2+ VIEW  Comparison: None.  Findings: Frontal pelvis with AP and cross-table lateral views of the left hip show a comminuted left intertrochanteric hip fracture with varus angulation.  Bones are diffusely demineralized.  SI joints and symphysis pubis are unremarkable.  IMPRESSION: Comminuted left intertrochanteric proximal femur fracture with varus angulation.   Original Report Authenticated By: Kennith Center, M.D.     Positive ROS:  All other systems have been reviewed and were otherwise negative with the exception of those mentioned in the HPI and as above.  Physical Exam: General: Alert, no acute distress Cardiovascular: No pedal edema Respiratory: No cyanosis, no use of accessory musculature GI: No organomegaly, abdomen is soft and non-tender. He has no rebound or guarding. Skin: No lesions in the area of chief complaint Neurologic: Sensation intact distally Psychiatric: Patient is competent for consent with normal mood and affect. He may have a mild component of dementia, but interacts appropriately and seems to understand the discussion. Lymphatic: No axillary or cervical lymphadenopathy  MUSCULOSKELETAL: Left hip is shortened and externally rotated. EHL and FHL are firing. Intact dorsalis pedis pulse. His other extremities are atraumatic with no tenderness.  Assessment: Left intertrochanteric complex hip fracture, multiple comorbidities including history of colon cancer, lung cancer, and recurrent small bowel obstruction.  Plan: This is an acute complicated situation, and I have discussed the options with the family. He carries risks when considering surgical intervention, however without surgery he will certainly have a complicated course, with high risk of blood clots, bed sores, pneumonia, etc. I recommended surgical intervention with intramedullary nail fixation.  The risks benefits and alternatives were discussed with the patient including but not limited to the risks of nonoperative treatment, versus surgical intervention including infection, bleeding, nerve injury, malunion, nonunion, the need for revision surgery, hardware prominence, hardware failure, the need for hardware removal, blood clots, cardiopulmonary complications, morbidity, mortality, among others, and they were willing to proceed.    He was n.p.o. since 8 AM this morning, and will plan to be evaluated and admitted by the internal medicine  service, and if optimized plan for surgical intervention possibly later on this evening. We will plan for Lovenox bridging to Coumadin for postoperative DVT prophylaxis, and began 24 hours after surgery. In the meantime sequential compression devices. Additionally, we'll try and avoid all narcotics do to his cognitive history of delirium from narcotics, as well as his history of recurrent small bowel obstruction.    Dacota Ruben P, MD Cell [phone] Pager [phone]  08/17/2012 1:22 PM

## 2012-08-17 NOTE — H&P (Addendum)
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 The patient apparently tripped and fell.Per report from [facility] pt was found outside at home. He reportedly slipped while walking. He is noted to have a altered LOC (alert to person, and place) not oriented to time or year. He is noted to have swelling to his L hip with shortening and external rotation to his L leg. + pedal pulses present bilaterally.  He denies any intercurrent illness, fever, and he has not been sick lately.       Review of Systems: negative for the following  Constitutional: Denies fever, chills, diaphoresis, appetite change and fatigue.  HEENT: Denies photophobia, eye pain, redness, hearing loss, ear pain, congestion, sore throat, rhinorrhea, sneezing, mouth sores, trouble swallowing, neck pain, neck stiffness and tinnitus.  Respiratory: Denies SOB, DOE, cough, chest tightness, and wheezing.  Cardiovascular: Denies chest pain, palpitations and leg swelling.  Gastrointestinal: Denies nausea, vomiting, abdominal pain, diarrhea, constipation, blood in stool and abdominal distention.  Genitourinary: Denies dysuria, urgency, frequency, hematuria, flank pain and difficulty urinating.  Musculoskeletal: Denies myalgias, back pain, joint swelling, arthralgias and gait problem.  Skin: Denies pallor, rash and wound.  Neurological: Denies dizziness, seizures, syncope, weakness, light-headedness, numbness and headaches.  Hematological: Denies adenopathy. Easy bruising, personal or family bleeding history  Psychiatric/Behavioral: Denies suicidal ideation, mood changes,  confusion, nervousness, sleep disturbance and agitation       Past Medical History  Diagnosis Date  . Hyperlipidemia   . Hypertension   . Allergy   . Fractured shoulder 8/30?    pt fell about 3 weeks ago and fractured shoulder blade   . colon ca dx'd 1994    chemo  . Lung cancer dx'd 07/2007    lu-lobectomy     Past Surgical History  Procedure Date  . Colon surgery   . Colostomy takedown   . Colostomy revision   . Hernia repair       Social History:  reports that he has never smoked. He does not have any smokeless tobacco history on file. He reports that he does not drink alcohol or use illicit drugs.    Allergies  Allergen Reactions  . Morphine And Related Other (See Comments)    hallucinations  . Codeine     Pt is allergic to pain medications including morphine     Family History  Problem Relation Age of Onset  . Stroke Mother   . Heart disease Brother      Prior to Admission medications   Medication Sig Start Date End Date Taking? Authorizing Provider  atorvastatin (LIPITOR) 40 MG tablet Take 40 mg by mouth daily.     Yes Historical Provider, MD  levothyroxine (SYNTHROID, LEVOTHROID) 112 MCG tablet Take 112 mcg by mouth daily.   Yes Historical Provider, MD  loratadine (CLARITIN) 10 MG tablet Take 10 mg by mouth daily.   Yes Historical Provider, MD  Multiple Vitamin (MULTIVITAMIN) tablet Take 1 tablet by mouth daily.   Yes Historical Provider, MD  triamterene-hydrochlorothiazide (DYAZIDE) 50-25 MG per capsule Take 1 capsule by mouth daily.   Yes Historical Provider, MD     Physical Exam: Filed Vitals:   08/17/12  1034 08/17/12 1037 08/17/12 1115 08/17/12 1213  BP:  147/71 129/51 133/60  Pulse:  82 80 83  Temp:  97.5 F (36.4 C)    TempSrc:  Oral    Resp:  16 16 17   Height:  6' (1.829 m)    Weight:  99.791 kg (220 lb)    SpO2: 99% 98% 97% 95%     Constitutional: Vital signs reviewed. Patient is a well-developed and well-nourished in no acute  distress and cooperative with exam. Alert and oriented x3.  Head: Normocephalic and atraumatic  Ear: TM normal bilaterally  Mouth: no erythema or exudates, MMM  Eyes: PERRL, EOMI, conjunctivae normal, No scleral icterus.  Neck: Supple, Trachea midline normal ROM, No JVD, mass, thyromegaly, or carotid bruit present.  Cardiovascular: RRR, S1 normal, S2 normal, no MRG, pulses symmetric and intact bilaterally  Pulmonary/Chest: CTAB, no wheezes, rales, or rhonchi  Abdominal: Soft. Non-tender, non-distended, bowel sounds are normal, no masses, organomegaly, or guarding present.  GU: no CVA tenderness Musculoskeletal: No joint deformities, erythema, or stiffness, ROM full and no nontender Ext: no edema and no cyanosis, pulses palpable bilaterally (DP and PT)  Hematology: no cervical, inginal, or axillary adenopathy.  Neurological: A&O x3, Strenght is normal and symmetric bilaterally, cranial nerve II-XII are grossly intact, no focal motor deficit, sensory intact to light touch bilaterally.  Skin: Warm, dry and intact. No rash, cyanosis, or clubbing.  Psychiatric: Normal mood and affect. speech and behavior is normal. Judgment and thought content normal. Cognition and memory are normal.       Labs on Admission:    Basic Metabolic Panel:  Lab 08/11/12 5784  NA 141  K 3.9  CL 98  CO2 34*  GLUCOSE 106*  BUN 9.0  CREATININE 1.0  CALCIUM 9.8  MG --  PHOS --   Liver Function Tests:  Lab 08/11/12 0807  AST 20  ALT 17  ALKPHOS 79  BILITOT 0.80  PROT 7.0  ALBUMIN 3.5   No results found for this basename: LIPASE:5,AMYLASE:5 in the last 168 hours No results found for this basename: AMMONIA:5 in the last 168 hours CBC:  Lab 08/17/12 1222 08/11/12 0807  WBC 11.7* 6.0  NEUTROABS 10.3* 4.0  HGB 14.6 15.2  HCT 43.2 44.4  MCV 88.7 89.8  PLT 186 186   Cardiac Enzymes: No results found for this basename: CKTOTAL:5,CKMB:5,CKMBINDEX:5,TROPONINI:5 in the last 168 hours  BNP (last 3  results) No results found for this basename: PROBNP:3 in the last 8760 hours    CBG: No results found for this basename: GLUCAP:5 in the last 168 hours  Radiological Exams on Admission: Dg Chest 1 View  08/17/2012  *RADIOLOGY REPORT*  Clinical Data: Fall.  History of lobectomy.  CHEST - 1 VIEW  Comparison: Chest CT from 08/12/2012  Findings: The lungs are clear without focal infiltrate, edema, pneumothorax or pleural effusion. Interstitial markings are diffusely coarsened with chronic features. Cardiopericardial silhouette is at upper limits of normal for size.  Bones are diffusely demineralized. Telemetry leads overlie the chest.  Gas distended bowel loops are noted in the visualized upper abdomen.  IMPRESSION: Borderline cardiomegaly with chronic interstitial coarsening but no acute cardiopulmonary findings.   Original Report Authenticated By: Kennith Center, M.D.    Dg Hip Complete Left  08/17/2012  *RADIOLOGY REPORT*  Clinical Data: Left hip pain  LEFT HIP - COMPLETE 2+ VIEW  Comparison: None.  Findings: Frontal pelvis with AP and cross-table lateral views of the left hip show a comminuted  left intertrochanteric hip fracture with varus angulation.  Bones are diffusely demineralized.  SI joints and symphysis pubis are unremarkable.  IMPRESSION: Comminuted left intertrochanteric proximal femur fracture with varus angulation.   Original Report Authenticated By: Kennith Center, M.D.     WUJ:WJXB: 08/17/2012  Rate:85  Rhythm: normal sinus rhythm  QRS Axis: left  Intervals: PR prolonged and QT prolonged  ST/T Wave abnormalities: normal  Conduction Disutrbances:none  Narrative Interpretation: Abnormal EKG.  Old EKG Reviewed: unchanged    Assessment/Plan Principal Problem:  *Hip fracture Active Problems:  MALIGNANT NEOPLASM LOWER LOBE BRONCHUS OR LUNG  HYPERLIPIDEMIA  HYPERTENSION  COPD   1. Hip fracture, likely mechanical fall, given altered mental status we'll do a CT of the head,  to rule out a bleed. Urinalysis negative, chest x-ray negative. CT scan of the chest on 12/6  shows no obvious infiltrate. Given his advanced age and other comorbidities he is a moderate risk for surgery, will get an EKG and a 2-D echo, hx of post afib , also has a heart murmur  2. Hypertension we'll hold off on triamterene/hydrochlorothiazide. We'll use when necessary hydralazine as needed 3. History of neuroendocrine tumor, apparently stable, outpatient CT every 6 months according to his oncologist  Code Status:   full Family Communication: bedside Disposition Plan: admit   Time spent: 70 mins   [facility] Pager [phone]  If 7PM-7AM, please contact night-coverage [email or web address] Password [email or web address] 08/17/2012, 1:20 PM

## 2012-08-17 NOTE — ED Notes (Signed)
Pt has gone from vascular lab to ct. Will go from ct to room upstairs

## 2012-08-17 NOTE — ED Notes (Signed)
Patient denies need for pain medications at present.  Family at bedside awaiting results.  Patient alert and oriented x 3.  ED monitors intact.

## 2012-08-18 ENCOUNTER — Encounter (HOSPITAL_COMMUNITY): Payer: Self-pay | Admitting: General Practice

## 2012-08-18 LAB — BASIC METABOLIC PANEL
BUN: 15 mg/dL (ref 6–23)
Chloride: 96 mEq/L (ref 96–112)
GFR calc Af Amer: >90 mL/min (ref >90)
GFR calc non Af Amer: 78 mL/min — ABNORMAL LOW (ref >90)
Potassium: 4.1 mEq/L (ref 3.5–5.1)
Sodium: 139 mEq/L (ref 135–145)

## 2012-08-18 LAB — TROPONIN I
Troponin I: <0.3 ng/mL (ref <0.30)
Troponin I: <0.3 ng/mL (ref <0.30)

## 2012-08-18 LAB — CBC
MCH: 29.2 pg (ref 26.0–34.0)
MCV: 89.7 fL (ref 78.0–100.0)
Platelets: 183 10*3/uL (ref 150–400)
RBC: 3.9 MIL/uL — ABNORMAL LOW (ref 4.22–5.81)
RDW: 13.5 % (ref 11.5–15.5)
WBC: 9.9 10*3/uL (ref 4.0–10.5)

## 2012-08-18 LAB — TSH: TSH: 1.372 u[IU]/mL (ref 0.350–4.500)

## 2012-08-18 LAB — MAGNESIUM: Magnesium: 1.2 mg/dL — ABNORMAL LOW (ref 1.5–2.5)

## 2012-08-18 MED ORDER — SODIUM CHLORIDE 0.9 % IV SOLN
INTRAVENOUS | Status: DC
  Administered 2012-08-18: 12:00:00 via INTRAVENOUS

## 2012-08-18 MED ORDER — MAGNESIUM SULFATE 40 MG/ML IJ SOLN
4.0000 g | Freq: Once | INTRAMUSCULAR | Status: AC
  Administered 2012-08-18: 4 g via INTRAVENOUS
  Filled 2012-08-18: qty 100

## 2012-08-18 NOTE — Progress Notes (Signed)
Patient ID: MILLAN LEGAN, male   DOB: 07-14-1928, 78 y.o.   MRN: [id number]     Subjective:  Patient reports pain as mild to moderate.  States that he is doing better.  Objective:   VITALS:   Filed Vitals:   08/17/12 2320 08/18/12 0000 08/18/12 0400 08/18/12 0701  BP: 126/58   142/86  Pulse: 79   56  Temp: 98.2 F (36.8 C)   97.6 F (36.4 C)  TempSrc:      Resp: 18 16 16 18   Height:      Weight:      SpO2: 100%   99%    ABD soft Sensation intact distally Dorsiflexion/Plantar flexion intact Incision: dressing C/D/I and no drainage  LABS  Results for orders placed during the hospital encounter of 08/17/12 (from the past 24 hour(s))  URINALYSIS, ROUTINE W REFLEX MICROSCOPIC     Status: Abnormal   Collection Time   08/17/12 11:22 AM      Component Value Range   Color, Urine YELLOW  YELLOW   APPearance CLEAR  CLEAR   Specific Gravity, Urine 1.015  1.005 - 1.030   pH 8.5 (*) 5.0 - 8.0   Glucose, UA NEGATIVE  NEGATIVE mg/dL   Hgb urine dipstick NEGATIVE  NEGATIVE   Bilirubin Urine NEGATIVE  NEGATIVE   Ketones, ur NEGATIVE  NEGATIVE mg/dL   Protein, ur 956 (*) NEGATIVE mg/dL   Urobilinogen, UA 1.0  0.0 - 1.0 mg/dL   Nitrite NEGATIVE  NEGATIVE   Leukocytes, UA NEGATIVE  NEGATIVE  URINE MICROSCOPIC-ADD ON     Status: Normal   Collection Time   08/17/12 11:22 AM      Component Value Range   Squamous Epithelial / LPF RARE  RARE   WBC, UA 0-2  <3 WBC/hpf   RBC / HPF 3-6  <3 RBC/hpf  BASIC METABOLIC PANEL     Status: Abnormal   Collection Time   08/17/12 12:22 PM      Component Value Range   Sodium 138  135 - 145 mEq/L   Potassium 4.6  3.5 - 5.1 mEq/L   Chloride 98  96 - 112 mEq/L   CO2 31  19 - 32 mEq/L   Glucose, Bld 143 (*) 70 - 99 mg/dL   BUN 12  6 - 23 mg/dL   Creatinine, Ser 2.13  0.50 - 1.35 mg/dL   Calcium 9.8  8.4 - 08.6 mg/dL   GFR calc non Af Amer 80 (*) >90 mL/min   GFR calc Af Amer >90  >90 mL/min  CBC WITH DIFFERENTIAL     Status: Abnormal   Collection Time   08/17/12 12:22 PM      Component Value Range   WBC 11.7 (*) 4.0 - 10.5 K/uL   RBC 4.87  4.22 - 5.81 MIL/uL   Hemoglobin 14.6  13.0 - 17.0 g/dL   HCT 57.8  46.9 - 62.9 %   MCV 88.7  78.0 - 100.0 fL   MCH 30.0  26.0 - 34.0 pg   MCHC 33.8  30.0 - 36.0 g/dL   RDW 52.8  41.3 - 24.4 %   Platelets 186  150 - 400 K/uL   Neutrophils Relative 88 (*) 43 - 77 %   Neutro Abs 10.3 (*) 1.7 - 7.7 K/uL   Lymphocytes Relative 6 (*) 12 - 46 %   Lymphs Abs 0.7  0.7 - 4.0 K/uL   Monocytes Relative 5  3 - 12 %  Monocytes Absolute 0.6  0.1 - 1.0 K/uL   Eosinophils Relative 0  0 - 5 %   Eosinophils Absolute 0.0  0.0 - 0.7 K/uL   Basophils Relative 0  0 - 1 %   Basophils Absolute 0.0  0.0 - 0.1 K/uL  PROTIME-INR     Status: Normal   Collection Time   08/17/12 12:22 PM      Component Value Range   Prothrombin Time 13.6  11.6 - 15.2 seconds   INR 1.05  0.00 - 1.49  TYPE AND SCREEN     Status: Normal   Collection Time   08/17/12 12:30 PM      Component Value Range   ABO/RH(D) O POS     Antibody Screen NEG     Sample Expiration 08/20/2012    HEPATIC FUNCTION PANEL     Status: Abnormal   Collection Time   08/17/12  4:30 PM      Component Value Range   Total Protein 6.5  6.0 - 8.3 g/dL   Albumin 3.4 (*) 3.5 - 5.2 g/dL   AST 20  0 - 37 U/L   ALT 14  0 - 53 U/L   Alkaline Phosphatase 67  39 - 117 U/L   Total Bilirubin 0.5  0.3 - 1.2 mg/dL   Bilirubin, Direct 0.1  0.0 - 0.3 mg/dL   Indirect Bilirubin 0.4  0.3 - 0.9 mg/dL  TROPONIN I     Status: Normal   Collection Time   08/17/12  4:30 PM      Component Value Range   Troponin I <0.30  <0.30 ng/mL  TROPONIN I     Status: Normal   Collection Time   08/18/12 12:20 AM      Component Value Range   Troponin I <0.30  <0.30 ng/mL  TROPONIN I     Status: Normal   Collection Time   08/18/12  6:30 AM      Component Value Range   Troponin I <0.30  <0.30 ng/mL  CBC     Status: Abnormal   Collection Time   08/18/12  6:30 AM       Component Value Range   WBC 9.9  4.0 - 10.5 K/uL   RBC 3.90 (*) 4.22 - 5.81 MIL/uL   Hemoglobin 11.4 (*) 13.0 - 17.0 g/dL   HCT 16.1 (*) 09.6 - 04.5 %   MCV 89.7  78.0 - 100.0 fL   MCH 29.2  26.0 - 34.0 pg   MCHC 32.6  30.0 - 36.0 g/dL   RDW 40.9  81.1 - 91.4 %   Platelets 183  150 - 400 K/uL  MAGNESIUM     Status: Abnormal   Collection Time   08/18/12  6:30 AM      Component Value Range   Magnesium 1.2 (*) 1.5 - 2.5 mg/dL  BASIC METABOLIC PANEL     Status: Abnormal   Collection Time   08/18/12  6:30 AM      Component Value Range   Sodium 139  135 - 145 mEq/L   Potassium 4.1  3.5 - 5.1 mEq/L   Chloride 96  96 - 112 mEq/L   CO2 30  19 - 32 mEq/L   Glucose, Bld 124 (*) 70 - 99 mg/dL   BUN 15  6 - 23 mg/dL   Creatinine, Ser 7.82  0.50 - 1.35 mg/dL   Calcium 8.9  8.4 - 95.6 mg/dL   GFR calc non Af Denyse Dago  78 (*) >90 mL/min   GFR calc Af Amer >90  >90 mL/min    Dg Chest 1 View  08/17/2012  *RADIOLOGY REPORT*  Clinical Data: Fall.  History of lobectomy.  CHEST - 1 VIEW  Comparison: Chest CT from 08/12/2012  Findings: The lungs are clear without focal infiltrate, edema, pneumothorax or pleural effusion. Interstitial markings are diffusely coarsened with chronic features. Cardiopericardial silhouette is at upper limits of normal for size.  Bones are diffusely demineralized. Telemetry leads overlie the chest.  Gas distended bowel loops are noted in the visualized upper abdomen.  IMPRESSION: Borderline cardiomegaly with chronic interstitial coarsening but no acute cardiopulmonary findings.   Original Report Authenticated By: Kennith Center, M.D.    Dg Hip Complete Left  08/17/2012  *RADIOLOGY REPORT*  Clinical Data: Left hip pain  LEFT HIP - COMPLETE 2+ VIEW  Comparison: None.  Findings: Frontal pelvis with AP and cross-table lateral views of the left hip show a comminuted left intertrochanteric hip fracture with varus angulation.  Bones are diffusely demineralized.  SI joints and symphysis  pubis are unremarkable.  IMPRESSION: Comminuted left intertrochanteric proximal femur fracture with varus angulation.   Original Report Authenticated By: Kennith Center, M.D.    Dg Femur Left  08/17/2012  *RADIOLOGY REPORT*  Clinical Data: ORIF left femur  LEFT FEMUR - 2 VIEW  Comparison: 08/17/2012  Findings: Intraoperative spot fluoroscopic views are obtained of the left hip and left knee for surgical control purposes.  Images demonstrate a interval internal reduction and intramedullary rod and screw fixation of fractures of the intertrochanteric left hip. Distal locking screw in the distal femoral metaphysis.  Improved alignment and position of the fracture fragments.  IMPRESSION: Intraoperative fluoroscopy of the left hip and left knee obtained for surgical control purposes.  Reduction and intramedullary rod and screw fixation of intertrochanteric active fractures.   Original Report Authenticated By: Burman Nieves, M.D.    Ct Head Wo Contrast  08/17/2012  *RADIOLOGY REPORT*  Clinical Data: Altered mental status  CT HEAD WITHOUT CONTRAST  Technique:  Contiguous axial images were obtained from the base of the skull through the vertex without contrast.  Comparison: MRI 10/14/2007  Findings: Generalized atrophy of a moderate degree.  Negative for acute infarct.  Negative for hemorrhage or mass.  Calvarium is intact.  IMPRESSION: Generalized atrophy without acute abnormality.   Original Report Authenticated By: Janeece Riggers, M.D.    Dg Pelvis Portable  08/17/2012  *RADIOLOGY REPORT*  Clinical Data: Postoperative radiographs of the left hip.  PORTABLE PELVIS  Comparison: 08/17/2012.  Findings: New left hip gamma nail.  Intertrochanteric fracture fragments appear unchanged from intraoperative fluoroscopy.  The alignment is improved compared to preoperative.  Dense atherosclerosis.  IMPRESSION: New uncomplicated left hip gamma nail.   Original Report Authenticated By: Andreas Newport, M.D.    Dg Hip Portable  1 View Left  08/17/2012  *RADIOLOGY REPORT*  Clinical Data: Left hip fracture.  ORIF.  PORTABLE LEFT HIP - 1 VIEW  Comparison: 08/17/2012.  Findings: New left hip gamma nail across intertrochanteric left femur fracture.  Long antegrade femoral nail with distal interlocking screws.  Distal aspect of the femur appears within normal limits.  Knee osteoarthritis is visualized.  Expected postsurgical changes in the soft tissues.  Nondiagnostic lateral views due to under penetration.  IMPRESSION: New left hip gamma nail fixation.  No complication.   Original Report Authenticated By: Andreas Newport, M.D.    Dg C-arm 61-120 Min-no Report  08/17/2012  CLINICAL DATA: ORIF  Left Femur   C-ARM 61-120 MINUTES  Fluoroscopy was utilized by the requesting physician.  No radiographic  interpretation.      Assessment/Plan: 1 Day Post-Op   Principal Problem:  *Closed left subtrochanteric femur fracture Active Problems:  MALIGNANT NEOPLASM LOWER LOBE BRONCHUS OR LUNG  HYPERLIPIDEMIA  HYPERTENSION  COPD   Advance diet Up with therapy DC foley SNF planning.  Haskel Khan 08/18/2012, 10:01 AM   Teryl Lucy, MD Cell [phone] Pager [phone]

## 2012-08-18 NOTE — Progress Notes (Signed)
Physical Therapy Treatment Patient Details Name: [name] MRN: [id number] DOB: 03/09/1928 Today's Date: 08/18/2012 Time: 4098-1191 PT Time Calculation (min): 25 min  PT Assessment / Plan / Recommendation Comments on Treatment Session  Pt mobility improved from a.m. sesion. However pt cognition declined. Spouse reports current cognitive level is his baseline.      Follow Up Recommendations  SNF;Supervision/Assistance - 24 hour     Equipment Recommendations  None recommended by PT    Frequency Min 3X/week   Plan Discharge plan remains appropriate;Frequency remains appropriate    Precautions / Restrictions Precautions Precautions: Fall Precaution Comments: history of falling at home.  Restrictions Weight Bearing Restrictions: Yes LLE Weight Bearing: Weight bearing as tolerated   Pertinent Vitals/Pain C/o pain. Pt unable to rate.      Mobility  Bed Mobility Bed Mobility: Sit to Supine;Scooting to HOB Supine to Sit: Not tested (comment) Sitting - Scoot to Edge of Bed: Not tested (comment) Sit to Supine: 1: +2 Total assist Sit to Supine: Patient Percentage: 20% Scooting to HOB: 1: +2 Total assist;With trapeze Scooting to [facility]: Patient Percentage: 30% Details for Bed Mobility Assistance: Assist to manage LEs and trunk.   Transfers Transfers: Sit to Stand;Stand to Sit;Stand Pivot Transfers Sit to Stand: 1: +2 Total assist;From bed;With upper extremity assist Sit to Stand: Patient Percentage: 40% Stand to Sit: 1: +2 Total assist;To chair/3-in-1;With upper extremity assist Stand to Sit: Patient Percentage: 40% Stand Pivot Transfers: 1: +2 Total assist Stand Pivot Transfers: Patient Percentage: 30% Details for Transfer Assistance: Step by step cueing for sequencing of movement.  Assist to initiate standing secondary to pain and weakness. Assist to contorl descent to chair. Assist to maintain standing balance secondary to posterior lean.  Ambulation/Gait Ambulation/Gait  Assistance: Not tested (comment)    Exercises     PT Diagnosis:    PT Problem List:   PT Treatment Interventions:     PT Goals Acute Rehab PT Goals PT Goal Formulation: With patient Time For Goal Achievement: 09/01/12 Potential to Achieve Goals: Fair Pt will go Supine/Side to Sit: with mod assist PT Goal: Supine/Side to Sit - Progress: Progressing toward goal Pt will Sit at Edge of Bed: with supervision;3-5 min;with bilateral upper extremity support;with unilateral upper extremity support Pt will go Sit to Supine/Side: with mod assist PT Goal: Sit to Supine/Side - Progress: Progressing toward goal Pt will Transfer Bed to Chair/Chair to Bed: with mod assist PT Transfer Goal: Bed to Chair/Chair to Bed - Progress: Progressing toward goal Pt will Ambulate: 1 - 15 feet;with mod assist;with rolling walker PT Goal: Ambulate - Progress: Not progressing  Visit Information  Last PT Received On: 08/18/12    Subjective Data  Subjective: Pt more confused than this morning.    Cognition  Overall Cognitive Status: Impaired Area of Impairment: Memory;Following commands;Problem solving Arousal/Alertness: Awake/alert Orientation Level: Disoriented to;Place;Time Behavior During Session: [facility] for tasks performed Memory: Decreased recall of precautions Following Commands: Follows one step commands inconsistently    Balance     End of Session PT - End of Session Equipment Utilized During Treatment: Gait belt Activity Tolerance: Patient limited by pain;Treatment limited secondary to medical complications (Comment) Patient left: in bed;with call bell/phone within reach Nurse Communication: Mobility status;Weight bearing status   GP     Srinidhi Landers 08/18/2012, 6:53 PM Milessa Hogan L. Damascus Feldpausch DPT [phone]

## 2012-08-18 NOTE — Progress Notes (Signed)
Utilization review completed. Bookert Guzzi, RN, BSN. 

## 2012-08-18 NOTE — Progress Notes (Signed)
Patient's EKG showing first degree heart block as well as paired and multifocal PVCs. Notified MD. Patient asymptomatic.

## 2012-08-18 NOTE — Clinical Social Work Psychosocial (Signed)
Patient:[name] Account Number: [id number]  Admit date: 08/17/12  Clinical Social Worker Date/Time: 08/18/12  Referred by: Physician Date Referred: 08/18/12  Referred for   SNF Placement   Other Referral:  Interview type: Patient and patient's wife Other interview type:  PSYCHOSOCIAL DATA  Living Status: Wife  Admitted from facility:  Level of care:  Primary support name:  [name] Primary support relationship to patient: Spouse  Degree of support available:  Stong and vested   CURRENT CONCERNS  Current Concerns   Post-Acute Placement   Other Concerns:  SOCIAL WORK ASSESSMENT / PLAN  CSW met with pt re: PT recommendation for SNF.   Pt lives with her husband ina private residence.   CSW explained placement process and answered questions.   Pt's and patient's wife have heard of [name]'s  CSW completed FL2 and initiated [facility] search   Weekday CSW to f/u with offers.   Assessment/plan status: Information/Referral to [name]  Other assessment/ plan:  Information/referral to community resources:  SNF   PTAR   PATIENT'S/FAMILY'S RESPONSE TO PLAN OF CARE:  Pt reports agreeable to ST SNF in order to increase strength and independence with mobility prior to return home with her grandson. Pt verbalized understanding of placement process and appreciation for CSW assist.   [name], MSW, [phone]

## 2012-08-18 NOTE — Evaluation (Signed)
Physical Therapy Evaluation Patient Details Name: [name] MRN: [id number] DOB: 08/16/1928 Today's Date: 08/18/2012 Time: 1040-1105 PT Time Calculation (min): 25 min  PT Assessment / Plan / Recommendation Clinical Impression  Pt is an 78 y/o male s/p IM nail for L intrertochanteric fx.   During session pt had a syncopal episiode sitting in chair.  Pt was unresponsive with eyes and mouth wide open and shaking.  Pt did not respond to verbal stimulis or sternal rub.  Placed pt in reclined position in chair. At that time pt was aroused and began to speak. Pt was not confused and was able to answer all questions.. Pt did not recall the syncopal episode but did recall working with PT prior to the episode. Notified RN  of episode.  Acute PT will follow pt.  Suggesting continued rehab in SNF setting.      PT Assessment  Patient needs continued PT services    Follow Up Recommendations  SNF;Supervision/Assistance - 24 hour    Does the patient have the potential to tolerate intense rehabilitation      Barriers to Discharge Inaccessible home environment;Decreased caregiver support lives with elderly wife who is unable to assist pt.     Equipment Recommendations  None recommended by PT    Recommendations for Other Services     Frequency Min 3X/week    Precautions / Restrictions Precautions Precautions: Fall Precaution Comments: history of falling at home.  Restrictions Weight Bearing Restrictions: Yes LLE Weight Bearing: Weight bearing as tolerated   Pertinent Vitals/Pain 6/10 pain in L Hip at rest.  Pt medicated during session.       Mobility  Bed Mobility Bed Mobility: Supine to Sit;Sitting - Scoot to Edge of Bed Supine to Sit: 1: +2 Total assist;HOB elevated Supine to Sit: Patient Percentage: 20% Sitting - Scoot to Edge of Bed: 1: +2 Total assist Sitting - Scoot to Edge of Bed: Patient Percentage: 20% Details for Bed Mobility Assistance: Assist for bilateral LEs assist to  manage trunk. Step by step cues for technique.  Transfers Transfers: Sit to Stand;Stand to Sit;Stand Pivot Transfers Sit to Stand: 1: +2 Total assist;From bed;With upper extremity assist Sit to Stand: Patient Percentage: 40% Stand to Sit: 1: +2 Total assist;To chair/3-in-1;With upper extremity assist Stand to Sit: Patient Percentage: 30% Stand Pivot Transfers: 1: +2 Total assist Stand Pivot Transfers: Patient Percentage: 20% Details for Transfer Assistance: Step by step cueing for sequencing of movement.  Assist to initiate standing secondary to pain and weakness. Assist to contorl descent to chair. Assist to maintain standing balance secondary to posterior lean.  Ambulation/Gait Ambulation/Gait Assistance: Not tested (comment) Ambulation/Gait Assistance Details: Attempted but pt unable.     Shoulder Instructions     Exercises     PT Diagnosis: Difficulty walking;Generalized weakness;Acute pain  PT Problem List: Decreased strength;Decreased range of motion;Decreased activity tolerance;Decreased balance;Decreased mobility;Decreased safety awareness;Decreased knowledge of precautions;Pain;Decreased knowledge of use of DME PT Treatment Interventions: Gait training;DME instruction;Functional mobility training;Therapeutic exercise;Therapeutic activities;Patient/family education   PT Goals Acute Rehab PT Goals PT Goal Formulation: With patient Time For Goal Achievement: 09/01/12 Potential to Achieve Goals: Fair Pt will go Supine/Side to Sit: with mod assist PT Goal: Supine/Side to Sit - Progress: Goal set today Pt will Sit at Edge of Bed: with supervision;3-5 min;with bilateral upper extremity support;with unilateral upper extremity support PT Goal: Sit at Edge Of Bed - Progress: Goal set today Pt will go Sit to Supine/Side: with mod assist PT Goal: Sit to  Supine/Side - Progress: Goal set today Pt will Transfer Bed to Chair/Chair to Bed: with mod assist PT Transfer Goal: Bed to  Chair/Chair to Bed - Progress: Goal set today Pt will Ambulate: 1 - 15 feet;with mod assist;with rolling walker PT Goal: Ambulate - Progress: Goal set today  Visit Information  Last PT Received On: 08/18/12 Assistance Needed: +2    Subjective Data  Subjective: Agree to PT eval  Patient Stated Goal: Walk    Prior Functioning  Home Living Lives With: Spouse Available Help at Discharge: Available 24 hours/day Type of Home: House Home Access: Stairs to enter [employer] of Steps: 2 Entrance Stairs-Rails: Right;Left;Can reach both Home Layout: One level Bathroom Toilet: Handicapped height Home Adaptive Equipment: Straight cane;Walker - rolling;Shower chair with back Prior Function Level of Independence: Independent with assistive device(s) Able to Take Stairs?: Yes Driving: Yes Vocation: Retired    IT consultant  Overall Cognitive Status: Appears within functional limits for tasks assessed/performed Arousal/Alertness: Awake/alert Orientation Level: Appears intact for tasks assessed Behavior During Session: [facility] for tasks performed    Extremity/Trunk Assessment Right Lower Extremity Assessment RLE ROM/Strength/Tone: [facility] for tasks assessed Left Lower Extremity Assessment LLE ROM/Strength/Tone: Unable to fully assess   Balance Balance Balance Assessed: Yes Static Sitting Balance Static Sitting - Balance Support: Feet supported;Bilateral upper extremity supported Static Sitting - Level of Assistance: 2: Max assist Static Sitting - Comment/# of Minutes: Pt presents with Right lateral lean in sitting. Unable to achieve centered position.  Static Standing Balance Static Standing - Balance Support: Bilateral upper extremity supported Static Standing - Level of Assistance: 1: +2 Total assist Static Standing - Comment/# of Minutes: Pt presents with posterior lean in standing.  Unable to establish balance despite tactile cueing to shift wt forward.   End of Session PT - End of  Session Equipment Utilized During Treatment: Gait belt Activity Tolerance: Patient limited by pain;Treatment limited secondary to medical complications (Comment) Patient left: in chair;with call bell/phone within reach;with family/visitor present Nurse Communication: Mobility status;Other (comment)  GP     Torey Reinard 08/18/2012, 2:39 PM  Zionna Homewood L. Deontay Ladnier DPT [phone]

## 2012-08-18 NOTE — Progress Notes (Signed)
[facility] PROGRESS NOTE  Craig DAUGHETY XBJ:[id number] DOB: [birth year] DOA: 08/17/2012 PCP: [name], MD  Assessment/Plan: Principal Problem:  *Closed left subtrochanteric femur fracture Active Problems:  MALIGNANT NEOPLASM LOWER LOBE BRONCHUS OR LUNG  HYPERLIPIDEMIA  HYPERTENSION  COPD    1. Hip fracture, likely mechanical fall, given altered mental status , CT of the head was negative, he has some baseline dementia per family. . Urinalysis negative, chest x-ray negative. CT scan of the chest on 12/6 shows no obvious infiltrate. Given his advanced age and other comorbidities he is a moderate risk for surgery, EKG shows multifocal PVCs, 2-D echo was normal, hx of post afib , also has a heart murmur  2. Hypertension we'll hold off on triamterene/hydrochlorothiazide. We'll use when necessary hydralazine as needed 3. History of neuroendocrine tumor, apparently stable, outpatient CT every 6 months according to his oncologist 4.  1.  2.   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 The patient apparently tripped and fell.Per report from [facility] pt was found outside at home. He reportedly slipped while walking. He is noted to have a altered LOC (alert to person, and place) not oriented to time or year. He is noted to have swelling to his L hip with shortening and external rotation to his L leg. + pedal pulses present bilaterally.  He denies any intercurrent illness, fever, and he has not been sick lately   Consultants: Eulas Post, MD   Procedures: INTRAMEDULLARY (IM) NAIL LEFT SUBTROCHANTERIC FEMUR  FRACTURE   Antibiotics:  None  HPI/Subjective: Low urine output overnight  Objective: Filed Vitals:   08/17/12 2320 08/18/12 0000 08/18/12 0400 08/18/12 0701  BP: 126/58   142/86  Pulse: 79   56  Temp: 98.2 F (36.8 C)   97.6 F (36.4 C)  TempSrc:      Resp: 18 16 16 18   Height:      Weight:      SpO2: 100%   99%    Intake/Output Summary (Last 24 hours) at 08/18/12 0741 Last data filed at 08/18/12 0643  Gross per 24 hour  Intake 2407.5 ml  Output    500 ml  Net 1907.5 ml    Exam:  Constitutional: Vital signs reviewed. Patient is a well-developed and well-nourished in no acute distress and cooperative with exam. Alert and oriented x3.  Head: Normocephalic and atraumatic  Ear: TM normal bilaterally  Mouth: no erythema or exudates, MMM  Eyes: PERRL, EOMI, conjunctivae normal, No scleral icterus.  Neck: Supple, Trachea midline normal ROM, No JVD, mass, thyromegaly, or carotid bruit present.  Cardiovascular: RRR, S1 normal, S2 normal, no MRG, pulses symmetric and intact bilaterally  Pulmonary/Chest: CTAB, no wheezes, rales, or rhonchi  Abdominal: Soft. Non-tender, non-distended, bowel sounds are normal, no masses, organomegaly, or guarding present.  GU: no CVA tenderness Musculoskeletal: No joint deformities, erythema, or stiffness, ROM full and no nontender Ext: no edema and no cyanosis, pulses palpable bilaterally (DP and PT)  Hematology: no cervical, inginal, or axillary adenopathy.  Neurological: A&O x3, Strenght is normal and symmetric bilaterally, cranial nerve II-XII are grossly intact, no focal motor deficit, sensory intact to light touch bilaterally.  Skin: Warm, dry and  intact. No rash, cyanosis, or clubbing.  Psychiatric: Normal mood and affect. speech and behavior is normal. Judgment and thought content normal. Cognition and memory are normal.       Data Reviewed: Basic Metabolic Panel:  Lab 08/17/12 1610 08/11/12 0807  NA 138 141  K 4.6 3.9  CL 98  98  CO2 31 34*  GLUCOSE 143* 106*  BUN 12 9.0  CREATININE 0.82 1.0  CALCIUM 9.8 9.8  MG -- --  PHOS -- --    Liver Function Tests:  Lab 08/17/12 1630 08/11/12 0807  AST 20 20  ALT 14 17  ALKPHOS 67 79  BILITOT 0.5 0.80  PROT 6.5 7.0  ALBUMIN 3.4* 3.5   No results found for this basename: LIPASE:5,AMYLASE:5 in the last 168 hours No results found for this basename: AMMONIA:5 in the last 168 hours  CBC:  Lab 08/17/12 1222 08/11/12 0807  WBC 11.7* 6.0  NEUTROABS 10.3* 4.0  HGB 14.6 15.2  HCT 43.2 44.4  MCV 88.7 89.8  PLT 186 186    Cardiac Enzymes:  Lab 08/18/12 0020 08/17/12 1630  CKTOTAL -- --  CKMB -- --  CKMBINDEX -- --  TROPONINI <0.30 <0.30   BNP (last 3 results) No results found for this basename: PROBNP:3 in the last 8760 hours   CBG: No results found for this basename: GLUCAP:5 in the last 168 hours  No results found for this or any previous visit (from the past 240 hour(s)).   Studies: Dg Chest 1 View  08/17/2012  *RADIOLOGY REPORT*  Clinical Data: Fall.  History of lobectomy.  CHEST - 1 VIEW  Comparison: Chest CT from 08/12/2012  Findings: The lungs are clear without focal infiltrate, edema, pneumothorax or pleural effusion. Interstitial markings are diffusely coarsened with chronic features. Cardiopericardial silhouette is at upper limits of normal for size.  Bones are diffusely demineralized. Telemetry leads overlie the chest.  Gas distended bowel loops are noted in the visualized upper abdomen.  IMPRESSION: Borderline cardiomegaly with chronic interstitial coarsening but no acute cardiopulmonary findings.   Original Report Authenticated By: Kennith Center, M.D.    Dg Hip Complete Left  08/17/2012  *RADIOLOGY REPORT*  Clinical Data: Left hip pain  LEFT HIP - COMPLETE 2+ VIEW  Comparison: None.  Findings: Frontal pelvis with AP and cross-table lateral views of the left hip show a comminuted left intertrochanteric hip fracture with varus angulation.   Bones are diffusely demineralized.  SI joints and symphysis pubis are unremarkable.  IMPRESSION: Comminuted left intertrochanteric proximal femur fracture with varus angulation.   Original Report Authenticated By: Kennith Center, M.D.    Dg Femur Left  08/17/2012  *RADIOLOGY REPORT*  Clinical Data: ORIF left femur  LEFT FEMUR - 2 VIEW  Comparison: 08/17/2012  Findings: Intraoperative spot fluoroscopic views are obtained of the left hip and left knee for surgical control purposes.  Images demonstrate a interval internal reduction and intramedullary rod and screw fixation of fractures of the intertrochanteric left hip. Distal locking screw in the distal femoral metaphysis.  Improved alignment and position of the fracture fragments.  IMPRESSION: Intraoperative fluoroscopy of the left hip and left knee obtained for surgical control purposes.  Reduction and intramedullary rod and screw fixation of intertrochanteric active fractures.   Original Report Authenticated By: Burman Nieves, M.D.    Ct Head Wo Contrast  08/17/2012  *RADIOLOGY REPORT*  Clinical Data: Altered mental status  CT HEAD WITHOUT CONTRAST  Technique:  Contiguous axial images were obtained from the base  of the skull through the vertex without contrast.  Comparison: MRI 10/14/2007  Findings: Generalized atrophy of a moderate degree.  Negative for acute infarct.  Negative for hemorrhage or mass.  Calvarium is intact.  IMPRESSION: Generalized atrophy without acute abnormality.   Original Report Authenticated By: Janeece Riggers, M.D.    Ct Chest W Contrast  08/12/2012  *RADIOLOGY REPORT*  Clinical Data: Malignant neoplasm of lower lobe.  Lung cancer with surgery only.  Colon cancer with chemotherapy completed.  No current complaints.  Stage to be non-small cell lung cancer. Diagnosed 07/2007.  Status post left lower lobectomy with lymph node dissection.  Status post four cycles of chemotherapy.  CT CHEST WITH CONTRAST  Technique:  Multidetector CT  imaging of the chest was performed following the standard protocol during bolus administration of intravenous contrast.  Contrast: 80mL OMNIPAQUE IOHEXOL 300 MG/ML  SOLN  Comparison: 02/10/2012 and the clinic note of 02/15/2012.  Findings: Lungs/pleura: Irregularity of the nondependent trachea on image 20/series 5 which is favored to be due to retained secretions; absent on the prior exam.  Status post left lower lobectomy.  Mild centrilobular emphysema. No nodules or airspace opacities. No pleural fluid.    Minimal left- sided pleural thickening which is unchanged.  Heart/Mediastinum: No supraclavicular adenopathy.  Atherosclerosis with moderate stenosis involving the left subclavian artery and 11/series 2.  Unchanged.  Mild cardiomegaly.  Coronary artery atherosclerosis with mitral annular calcifications.  No pericardial fluid. No central pulmonary embolism, on this non-dedicated study.  Small mediastinal nodes which are stable.  No hilar adenopathy.  Upper abdomen: Stable mild left adrenal nodularity.  Similar mild right adrenal thickening.  Small low-density bilateral renal lesions which are likely cysts.  Bones/Musculoskeletal:  Moderate osteopenia.  Remote right rib trauma.  IMPRESSION:  1.  Surgical changes of left lower lobectomy, without recurrent or metastatic disease. 2.  Incidental findings, including moderate narrowing of the proximal left subclavian artery and coronary artery disease.   Original Report Authenticated By: Jeronimo Greaves, M.D.    Dg Pelvis Portable  08/17/2012  *RADIOLOGY REPORT*  Clinical Data: Postoperative radiographs of the left hip.  PORTABLE PELVIS  Comparison: 08/17/2012.  Findings: New left hip gamma nail.  Intertrochanteric fracture fragments appear unchanged from intraoperative fluoroscopy.  The alignment is improved compared to preoperative.  Dense atherosclerosis.  IMPRESSION: New uncomplicated left hip gamma nail.   Original Report Authenticated By: Andreas Newport, M.D.     Dg Hip Portable 1 View Left  08/17/2012  *RADIOLOGY REPORT*  Clinical Data: Left hip fracture.  ORIF.  PORTABLE LEFT HIP - 1 VIEW  Comparison: 08/17/2012.  Findings: New left hip gamma nail across intertrochanteric left femur fracture.  Long antegrade femoral nail with distal interlocking screws.  Distal aspect of the femur appears within normal limits.  Knee osteoarthritis is visualized.  Expected postsurgical changes in the soft tissues.  Nondiagnostic lateral views due to under penetration.  IMPRESSION: New left hip gamma nail fixation.  No complication.   Original Report Authenticated By: Andreas Newport, M.D.    Dg C-arm 61-120 Min-no Report  08/17/2012  CLINICAL DATA: ORIF Left Femur   C-ARM 61-120 MINUTES  Fluoroscopy was utilized by the requesting physician.  No radiographic  interpretation.      Scheduled Meds:   . acetaminophen  1,000 mg Intravenous Q6H  . atorvastatin  40 mg Oral q1800  .  ceFAZolin (ANCEF) IV  2 g Intravenous Q6H  . docusate sodium  100 mg Oral BID  . enoxaparin (  LOVENOX) injection  40 mg Subcutaneous Q24H  . [EXPIRED] etomidate      . levothyroxine  112 mcg Oral QAC breakfast  . [EXPIRED] lidocaine (cardiac) 100 mg/16ml      . loratadine  10 mg Oral Daily  . multivitamin with minerals  1 tablet Oral Daily  . [EXPIRED] rocuronium      . senna  1 tablet Oral BID  . [EXPIRED] succinylcholine      . [DISCONTINUED] acetaminophen  1,000 mg Intravenous Q6H  . [DISCONTINUED]  ceFAZolin (ANCEF) IV  2 g Intravenous 30 min Pre-Op  . [DISCONTINUED] docusate sodium  100 mg Oral BID  . [DISCONTINUED] enoxaparin (LOVENOX) injection  40 mg Subcutaneous Q24H  . [DISCONTINUED] fentaNYL  50 mcg Intravenous Once  . [DISCONTINUED] multivitamin  1 tablet Oral Daily  . [DISCONTINUED] ondansetron (ZOFRAN) IV  4 mg Intravenous Once  . [DISCONTINUED] sodium chloride  3 mL Intravenous Q12H   Continuous Infusions:   . 0.45 % NaCl with KCl 20 mEq / L 75 mL/hr at 08/17/12 2357   . [DISCONTINUED] sodium chloride 1,000 mL (08/17/12 1221)  . [DISCONTINUED] sodium chloride      Principal Problem:  *Closed left subtrochanteric femur fracture Active Problems:  MALIGNANT NEOPLASM LOWER LOBE BRONCHUS OR LUNG  HYPERLIPIDEMIA  HYPERTENSION  COPD    Time spent: 40 minutes   [facility]  [facility] Pager [phone]. If 8PM-8AM, please contact night-coverage at [email or web address], password [facility] 08/18/2012, 7:41 AM  LOS: 1 day

## 2012-08-18 NOTE — Progress Notes (Signed)
Orthopedic Tech Progress Note Patient Details:  [name] October 02, 1927 [id number]   OHF trapeze   Fayette Pho M 08/18/2012, 7:32 AM

## 2012-08-18 NOTE — Progress Notes (Signed)
Patient's urine output is less than 35 ml/hr. Bladder scan showed 46 ml and foley had 100 ml in it. Encouraged patient to drink more fluids. Patient's urine had a strong odor and amber in color. Keeping foley in at this time to monitor urine output. Will continue to monitor.

## 2012-08-18 NOTE — Clinical Social Work Placement (Addendum)
Clinical Social Work Department  CLINICAL SOCIAL WORK PLACEMENT NOTE  08/17/2012  Patient: [name] Account Number: [id number]  Admit date: 08/10/12  Clinical Social Worker: Sabino Niemann MSW Date/time: 08/18/2012 1:30 AM  Clinical Social Work is seeking post-discharge placement for this patient at the following level of care: SKILLED NURSING (*CSW will update this form in Epic as items are completed)  08/18/2012 Patient/family provided with [facility] Department of Clinical Social Work's list of facilities offering this level of care within the geographic area requested by the patient (or if unable, by the patient's family).  08/18/2012 Patient/family informed of their freedom to choose among providers that offer the needed level of care, that participate in Medicare, Medicaid or managed care program needed by the patient, have an available bed and are willing to accept the patient.  08/18/2012 Patient/family informed of [facility]' ownership interest in [facility], as well as of the fact that they are under no obligation to receive care at this facility.  PASARR submitted to EDS on 08/18/12 PASARR number received from EDS on 08/18/12 FL2 transmitted to all facilities in geographic area requested by pt/family on 08/18/2012  FL2 transmitted to all facilities within larger geographic area on  Patient informed that his/her managed care company has contracts with or will negotiate with certain facilities, including the following:  Patient/family informed of bed offers received:  Patient [facility] bed at [facility]) Physician recommends and patient chooses bed at [facility] ([facility]) Patient to be transferred to on  Patient to be transferred to facility by  The following physician request were entered in Epic:  Additional Comments:  Sabino Niemann, MSW  [phone]

## 2012-08-19 ENCOUNTER — Encounter (HOSPITAL_COMMUNITY): Payer: Self-pay | Admitting: Orthopedic Surgery

## 2012-08-19 LAB — PREPARE RBC (CROSSMATCH)

## 2012-08-19 LAB — CBC
HCT: 28.3 % — ABNORMAL LOW (ref 39.0–52.0)
MCHC: 34.3 g/dL (ref 30.0–36.0)
Platelets: 166 10*3/uL (ref 150–400)
RDW: 13.5 % (ref 11.5–15.5)
WBC: 10.1 10*3/uL (ref 4.0–10.5)

## 2012-08-19 LAB — TROPONIN I: Troponin I: <0.3 ng/mL (ref <0.30)

## 2012-08-19 MED ORDER — METOPROLOL TARTRATE 12.5 MG HALF TABLET
12.5000 mg | ORAL_TABLET | Freq: Two times a day (BID) | ORAL | Status: DC
  Administered 2012-08-19 – 2012-08-22 (×6): 12.5 mg via ORAL
  Filled 2012-08-19 (×10): qty 1

## 2012-08-19 NOTE — Progress Notes (Addendum)
[facility] PROGRESS NOTE  NIMAI BURBACH ZOX:[id number] DOB: 19-Feb-1928 DOA: 08/17/2012 PCP: [name], MD  Assessment/Plan: Principal Problem:  *Closed left subtrochanteric femur fracture Active Problems:  MALIGNANT NEOPLASM LOWER LOBE BRONCHUS OR LUNG  HYPERLIPIDEMIA  HYPERTENSION  COPD    1. Hip fracture, likely mechanical fall, given altered mental status , CT of the head was negative, he has some baseline dementia per family. . Urinalysis negative, chest x-ray negative. CT scan of the chest on 12/6 shows no obvious infiltrate. Given his advanced age and other comorbidities he is a moderate risk for surgery, EKG shows multifocal PVCs, 2-D echo was normal, hx of post afib , also has a heart murmur . No documented arrhythmias. Postoperatively he was found to have a low urine output now improved 2. Hypertension we'll hold off on triamterene/hydrochlorothiazide. We'll use when necessary hydralazine as needed 3. History of neuroendocrine tumor, apparently stable, outpatient CT every 6 months according to his oncologist 4. Syncopal episode, patient had a near-syncopal episode when getting out of bed yesterday, he could have been orthostatic.his cardiac enzymes have been negative, 2-D echo was within normal limits, his magnesium was found to be 1.2 and has been repeated. 5. Anemia hemoglobin has dropped from 15.2-9.7. He will be transfused with 1 unit of packed red blood cells due to symptomatic anemia, a near-syncopal episode,   6. 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 The patient apparently tripped and fell.Per report from [facility] pt was found outside at home. He reportedly slipped while walking. He is noted to have a altered LOC (alert to person, and place) not oriented to time or year. He is noted to have swelling to his L hip with shortening and external rotation to his L leg. + pedal pulses present bilaterally.  He denies any intercurrent illness, fever, and he has not been sick lately  Consultants:  Eulas Post, MD  Procedures:  INTRAMEDULLARY (IM) NAIL LEFT SUBTROCHANTERIC FEMUR FRACTURE  Antibiotics:  None   HPI/Subjective: No events overnight  Objective: Filed Vitals:   08/18/12 2146 08/19/12 0000 08/19/12 0400 08/19/12 0640  BP: 140/75   148/72  Pulse: 54   50  Temp: 98.6 F (37 C)   97.7 F (36.5 C)  TempSrc:      Resp: 18 16 16 18   Height:      Weight:      SpO2: 99% 95% 98% 99%    Intake/Output Summary (Last 24 hours) at 08/19/12 0835 Last data filed at 08/19/12 0300  Gross per 24 hour  Intake      0 ml  Output   1300 ml  Net  -1300 ml    Exam:  HENT:  Head: Atraumatic.  Nose: Nose normal.  Mouth/Throat: Oropharynx is clear and moist.  Eyes: Conjunctivae are normal. Pupils are equal, round, and reactive to light. No scleral icterus.  Neck: Neck supple. No tracheal deviation present.  Cardiovascular: Normal rate, regular rhythm, normal heart sounds and intact distal pulses.  Pulmonary/Chest: Effort normal and breath sounds normal. No respiratory distress.  Abdominal: Soft. Normal appearance and bowel sounds  are normal. She exhibits no distension. There is no tenderness.  Musculoskeletal: She exhibits no edema and no tenderness.  Neurological: She is alert. No cranial nerve deficit.    Data Reviewed: Basic Metabolic Panel:  Lab 08/19/12 1610 08/18/12 0630 08/17/12 1222  NA -- 139 138  K -- 4.1 4.6  CL -- 96 98  CO2 -- 30 31  GLUCOSE -- 124* 143*  BUN -- 15 12  CREATININE --  0.87 0.82  CALCIUM -- 8.9 9.8  MG 2.2 1.2* --  PHOS -- -- --    Liver Function Tests:  Lab 08/17/12 1630  AST 20  ALT 14  ALKPHOS 67  BILITOT 0.5  PROT 6.5  ALBUMIN 3.4*   No results found for this basename: LIPASE:5,AMYLASE:5 in the last 168 hours No results found for this basename: AMMONIA:5 in the last 168 hours  CBC:  Lab 08/19/12 0512 08/18/12 0630 08/17/12 1222  WBC 10.1 9.9 11.7*  NEUTROABS -- -- 10.3*  HGB 9.7* 11.4* 14.6  HCT 28.3* 35.0* 43.2  MCV 87.9 89.7 88.7  PLT 166 183 186    Cardiac Enzymes:  Lab 08/18/12 2326 08/18/12 1701 08/18/12 1155 08/18/12 0630 08/18/12 0020  CKTOTAL -- -- -- -- --  CKMB -- -- -- -- --  CKMBINDEX -- -- -- -- --  TROPONINI <0.30 <0.30 <0.30 <0.30 <0.30   BNP (last 3 results) No results found for this basename: PROBNP:3 in the last 8760 hours   CBG: No results found for this basename: GLUCAP:5 in the last 168 hours  No results found for this or any previous visit (from the past 240 hour(s)).   Studies: Dg Chest 1 View  08/17/2012  *RADIOLOGY REPORT*  Clinical Data: Fall.  History of lobectomy.  CHEST - 1 VIEW  Comparison: Chest CT from 08/12/2012  Findings: The lungs are clear without focal infiltrate, edema, pneumothorax or pleural effusion. Interstitial markings are diffusely coarsened with chronic features. Cardiopericardial silhouette is at upper limits of normal for size.  Bones are diffusely demineralized. Telemetry leads overlie the chest.  Gas distended bowel loops are noted in the visualized upper abdomen.  IMPRESSION: Borderline cardiomegaly with chronic interstitial coarsening but no acute cardiopulmonary findings.   Original Report Authenticated By: Kennith Center, M.D.    Dg Hip Complete Left  08/17/2012  *RADIOLOGY REPORT*  Clinical Data: Left hip pain  LEFT HIP - COMPLETE 2+ VIEW  Comparison: None.  Findings: Frontal pelvis with AP and cross-table lateral views of the left hip show a comminuted left  intertrochanteric hip fracture with varus angulation.  Bones are diffusely demineralized.  SI joints and symphysis pubis are unremarkable.  IMPRESSION: Comminuted left intertrochanteric proximal femur fracture with varus angulation.   Original Report Authenticated By: Kennith Center, M.D.    Dg Femur Left  08/17/2012  *RADIOLOGY REPORT*  Clinical Data: ORIF left femur  LEFT FEMUR - 2 VIEW  Comparison: 08/17/2012  Findings: Intraoperative spot fluoroscopic views are obtained of the left hip and left knee for surgical control purposes.  Images demonstrate a interval internal reduction and intramedullary rod and screw fixation of fractures of the intertrochanteric left hip. Distal locking screw in the distal femoral metaphysis.  Improved alignment and position of the fracture fragments.  IMPRESSION: Intraoperative fluoroscopy of the left hip and left knee obtained for surgical control purposes.  Reduction and intramedullary rod and screw fixation of intertrochanteric active fractures.   Original Report Authenticated By: Burman Nieves, M.D.    Ct Head Wo  Contrast  08/17/2012  *RADIOLOGY REPORT*  Clinical Data: Altered mental status  CT HEAD WITHOUT CONTRAST  Technique:  Contiguous axial images were obtained from the base of the skull through the vertex without contrast.  Comparison: MRI 10/14/2007  Findings: Generalized atrophy of a moderate degree.  Negative for acute infarct.  Negative for hemorrhage or mass.  Calvarium is intact.  IMPRESSION: Generalized atrophy without acute abnormality.   Original Report Authenticated By: Janeece Riggers, M.D.    Ct Chest W Contrast  08/12/2012  *RADIOLOGY REPORT*  Clinical Data: Malignant neoplasm of lower lobe.  Lung cancer with surgery only.  Colon cancer with chemotherapy completed.  No current complaints.  Stage to be non-small cell lung cancer. Diagnosed 07/2007.  Status post left lower lobectomy with lymph node dissection.  Status post four cycles of chemotherapy.  CT  CHEST WITH CONTRAST  Technique:  Multidetector CT imaging of the chest was performed following the standard protocol during bolus administration of intravenous contrast.  Contrast: 80mL OMNIPAQUE IOHEXOL 300 MG/ML  SOLN  Comparison: 02/10/2012 and the clinic note of 02/15/2012.  Findings: Lungs/pleura: Irregularity of the nondependent trachea on image 20/series 5 which is favored to be due to retained secretions; absent on the prior exam.  Status post left lower lobectomy.  Mild centrilobular emphysema. No nodules or airspace opacities. No pleural fluid.    Minimal left- sided pleural thickening which is unchanged.  Heart/Mediastinum: No supraclavicular adenopathy.  Atherosclerosis with moderate stenosis involving the left subclavian artery and 11/series 2.  Unchanged.  Mild cardiomegaly.  Coronary artery atherosclerosis with mitral annular calcifications.  No pericardial fluid. No central pulmonary embolism, on this non-dedicated study.  Small mediastinal nodes which are stable.  No hilar adenopathy.  Upper abdomen: Stable mild left adrenal nodularity.  Similar mild right adrenal thickening.  Small low-density bilateral renal lesions which are likely cysts.  Bones/Musculoskeletal:  Moderate osteopenia.  Remote right rib trauma.  IMPRESSION:  1.  Surgical changes of left lower lobectomy, without recurrent or metastatic disease. 2.  Incidental findings, including moderate narrowing of the proximal left subclavian artery and coronary artery disease.   Original Report Authenticated By: Jeronimo Greaves, M.D.    Dg Pelvis Portable  08/17/2012  *RADIOLOGY REPORT*  Clinical Data: Postoperative radiographs of the left hip.  PORTABLE PELVIS  Comparison: 08/17/2012.  Findings: New left hip gamma nail.  Intertrochanteric fracture fragments appear unchanged from intraoperative fluoroscopy.  The alignment is improved compared to preoperative.  Dense atherosclerosis.  IMPRESSION: New uncomplicated left hip gamma nail.   Original  Report Authenticated By: Andreas Newport, M.D.    Dg Hip Portable 1 View Left  08/17/2012  *RADIOLOGY REPORT*  Clinical Data: Left hip fracture.  ORIF.  PORTABLE LEFT HIP - 1 VIEW  Comparison: 08/17/2012.  Findings: New left hip gamma nail across intertrochanteric left femur fracture.  Long antegrade femoral nail with distal interlocking screws.  Distal aspect of the femur appears within normal limits.  Knee osteoarthritis is visualized.  Expected postsurgical changes in the soft tissues.  Nondiagnostic lateral views due to under penetration.  IMPRESSION: New left hip gamma nail fixation.  No complication.   Original Report Authenticated By: Andreas Newport, M.D.    Dg C-arm 61-120 Min-no Report  08/17/2012  CLINICAL DATA: ORIF Left Femur   C-ARM 61-120 MINUTES  Fluoroscopy was utilized by the requesting physician.  No radiographic  interpretation.      Scheduled Meds:   . atorvastatin  40 mg Oral q1800  . docusate  sodium  100 mg Oral BID  . enoxaparin (LOVENOX) injection  40 mg Subcutaneous Q24H  . levothyroxine  112 mcg Oral QAC breakfast  . loratadine  10 mg Oral Daily  . multivitamin with minerals  1 tablet Oral Daily  . senna  1 tablet Oral BID   Continuous Infusions:   . sodium chloride 75 mL/hr at 08/18/12 1220    Principal Problem:  *Closed left subtrochanteric femur fracture Active Problems:  MALIGNANT NEOPLASM LOWER LOBE BRONCHUS OR LUNG  HYPERLIPIDEMIA  HYPERTENSION  COPD    Time spent: 40 minutes   [facility]  [facility] Pager [phone]. If 8PM-8AM, please contact night-coverage at [email or web address], password [facility] 08/19/2012, 8:35 AM  LOS: 2 days

## 2012-08-19 NOTE — Progress Notes (Signed)
Patient ID: [name], male   DOB: 1927-11-05, 78 y.o.   MRN: [id number]     Subjective:  Patient reports pain as mild to moderate.  He states that he has been up from bed to chair but mainly just staying in the bed. Have talked with him about WBAT with assistance and sitting in the chair more.  Objective:   VITALS:   Filed Vitals:   08/18/12 2000 08/18/12 2146 08/19/12 0000 08/19/12 0400  BP:  140/75    Pulse:  54    Temp:  98.6 F (37 C)    TempSrc:      Resp: 16 18 16 16   Height:      Weight:      SpO2: 95% 99% 95% 98%    ABD soft Sensation intact distally Dorsiflexion/Plantar flexion intact Incision: dressing C/D/I and no drainage  LABS  Results for orders placed during the hospital encounter of 08/17/12 (from the past 24 hour(s))  TSH     Status: Normal   Collection Time   08/18/12  6:30 AM      Component Value Range   TSH 1.372  0.350 - 4.500 uIU/mL  TROPONIN I     Status: Normal   Collection Time   08/18/12  6:30 AM      Component Value Range   Troponin I <0.30  <0.30 ng/mL  CBC     Status: Abnormal   Collection Time   08/18/12  6:30 AM      Component Value Range   WBC 9.9  4.0 - 10.5 K/uL   RBC 3.90 (*) 4.22 - 5.81 MIL/uL   Hemoglobin 11.4 (*) 13.0 - 17.0 g/dL   HCT 40.9 (*) 81.1 - 91.4 %   MCV 89.7  78.0 - 100.0 fL   MCH 29.2  26.0 - 34.0 pg   MCHC 32.6  30.0 - 36.0 g/dL   RDW 78.2  95.6 - 21.3 %   Platelets 183  150 - 400 K/uL  MAGNESIUM     Status: Abnormal   Collection Time   08/18/12  6:30 AM      Component Value Range   Magnesium 1.2 (*) 1.5 - 2.5 mg/dL  BASIC METABOLIC PANEL     Status: Abnormal   Collection Time   08/18/12  6:30 AM      Component Value Range   Sodium 139  135 - 145 mEq/L   Potassium 4.1  3.5 - 5.1 mEq/L   Chloride 96  96 - 112 mEq/L   CO2 30  19 - 32 mEq/L   Glucose, Bld 124 (*) 70 - 99 mg/dL   BUN 15  6 - 23 mg/dL   Creatinine, Ser 0.86  0.50 - 1.35 mg/dL   Calcium 8.9  8.4 - 57.8 mg/dL   GFR calc non Af  Amer 78 (*) >90 mL/min   GFR calc Af Amer >90  >90 mL/min  TROPONIN I     Status: Normal   Collection Time   08/18/12 11:55 AM      Component Value Range   Troponin I <0.30  <0.30 ng/mL  TROPONIN I     Status: Normal   Collection Time   08/18/12  5:01 PM      Component Value Range   Troponin I <0.30  <0.30 ng/mL  TROPONIN I     Status: Normal   Collection Time   08/18/12 11:26 PM      Component Value Range  Troponin I <0.30  <0.30 ng/mL    Dg Chest 1 View  08/17/2012  *RADIOLOGY REPORT*  Clinical Data: Fall.  History of lobectomy.  CHEST - 1 VIEW  Comparison: Chest CT from 08/12/2012  Findings: The lungs are clear without focal infiltrate, edema, pneumothorax or pleural effusion. Interstitial markings are diffusely coarsened with chronic features. Cardiopericardial silhouette is at upper limits of normal for size.  Bones are diffusely demineralized. Telemetry leads overlie the chest.  Gas distended bowel loops are noted in the visualized upper abdomen.  IMPRESSION: Borderline cardiomegaly with chronic interstitial coarsening but no acute cardiopulmonary findings.   Original Report Authenticated By: Kennith Center, M.D.    Dg Hip Complete Left  08/17/2012  *RADIOLOGY REPORT*  Clinical Data: Left hip pain  LEFT HIP - COMPLETE 2+ VIEW  Comparison: None.  Findings: Frontal pelvis with AP and cross-table lateral views of the left hip show a comminuted left intertrochanteric hip fracture with varus angulation.  Bones are diffusely demineralized.  SI joints and symphysis pubis are unremarkable.  IMPRESSION: Comminuted left intertrochanteric proximal femur fracture with varus angulation.   Original Report Authenticated By: Kennith Center, M.D.    Dg Femur Left  08/17/2012  *RADIOLOGY REPORT*  Clinical Data: ORIF left femur  LEFT FEMUR - 2 VIEW  Comparison: 08/17/2012  Findings: Intraoperative spot fluoroscopic views are obtained of the left hip and left knee for surgical control purposes.  Images  demonstrate a interval internal reduction and intramedullary rod and screw fixation of fractures of the intertrochanteric left hip. Distal locking screw in the distal femoral metaphysis.  Improved alignment and position of the fracture fragments.  IMPRESSION: Intraoperative fluoroscopy of the left hip and left knee obtained for surgical control purposes.  Reduction and intramedullary rod and screw fixation of intertrochanteric active fractures.   Original Report Authenticated By: Burman Nieves, M.D.    Ct Head Wo Contrast  08/17/2012  *RADIOLOGY REPORT*  Clinical Data: Altered mental status  CT HEAD WITHOUT CONTRAST  Technique:  Contiguous axial images were obtained from the base of the skull through the vertex without contrast.  Comparison: MRI 10/14/2007  Findings: Generalized atrophy of a moderate degree.  Negative for acute infarct.  Negative for hemorrhage or mass.  Calvarium is intact.  IMPRESSION: Generalized atrophy without acute abnormality.   Original Report Authenticated By: Janeece Riggers, M.D.    Dg Pelvis Portable  08/17/2012  *RADIOLOGY REPORT*  Clinical Data: Postoperative radiographs of the left hip.  PORTABLE PELVIS  Comparison: 08/17/2012.  Findings: New left hip gamma nail.  Intertrochanteric fracture fragments appear unchanged from intraoperative fluoroscopy.  The alignment is improved compared to preoperative.  Dense atherosclerosis.  IMPRESSION: New uncomplicated left hip gamma nail.   Original Report Authenticated By: Andreas Newport, M.D.    Dg Hip Portable 1 View Left  08/17/2012  *RADIOLOGY REPORT*  Clinical Data: Left hip fracture.  ORIF.  PORTABLE LEFT HIP - 1 VIEW  Comparison: 08/17/2012.  Findings: New left hip gamma nail across intertrochanteric left femur fracture.  Long antegrade femoral nail with distal interlocking screws.  Distal aspect of the femur appears within normal limits.  Knee osteoarthritis is visualized.  Expected postsurgical changes in the soft tissues.   Nondiagnostic lateral views due to under penetration.  IMPRESSION: New left hip gamma nail fixation.  No complication.   Original Report Authenticated By: Andreas Newport, M.D.    Dg C-arm 61-120 Min-no Report  08/17/2012  CLINICAL DATA: ORIF Left Femur   C-ARM 61-120 MINUTES  Fluoroscopy was utilized by the requesting physician.  No radiographic  interpretation.      Assessment/Plan: 2 Days Post-Op   Principal Problem:  *Closed left subtrochanteric femur fracture Active Problems:  MALIGNANT NEOPLASM LOWER LOBE BRONCHUS OR LUNG  HYPERLIPIDEMIA  HYPERTENSION  COPD   Advance diet Up with therapy Plan for SNF placement. Will plan to change Dressing tomorrow   Haskel Khan 08/19/2012, 6:13 AM   Teryl Lucy, MD Cell [phone] Pager [phone]

## 2012-08-19 NOTE — Progress Notes (Signed)
Patient would like to accept a bed at [facility] or [facility]. Patient's wife plans to visit the facilities this weekend.  Sabino Niemann, MSW, [phone]

## 2012-08-19 NOTE — Evaluation (Signed)
Occupational Therapy Evaluation Patient Details Name: [name] MRN: [id number] DOB: [birth year] Today's Date: 08/19/2012 Time: 4098-1191 OT Time Calculation (min): 23 min  OT Assessment / Plan / Recommendation Clinical Impression  78 yo male s/p Lt IM Nail that could benefit from skilled OT acutely. Recommend SNf for d/c planning    OT Assessment  Patient needs continued OT Services    Follow Up Recommendations  SNF    Barriers to Discharge      Equipment Recommendations  3 in 1 bedside comode;Wheelchair (measurements OT);Hospital bed    Recommendations for Other Services    Frequency  Min 2X/week    Precautions / Restrictions Precautions Precautions: Fall Precaution Comments: history of falling at home.  Restrictions LLE Weight Bearing: Weight bearing as tolerated   Pertinent Vitals/Pain No pain reported Pt reports soreness "in this leg" "i wasn't told it was only one leg"- response to education of Lt LE surg    ADL       OT Diagnosis: Generalized weakness;Acute pain  OT Problem List: Decreased strength;Decreased activity tolerance;Impaired balance (sitting and/or standing);Decreased safety awareness;Decreased knowledge of use of DME or AE;Decreased knowledge of precautions;Pain OT Treatment Interventions: Self-care/ADL training;DME and/or AE instruction;Therapeutic activities;Patient/family education;Balance training   OT Goals Acute Rehab OT Goals OT Goal Formulation: Patient unable to participate in goal setting Time For Goal Achievement: 09/02/12 Potential to Achieve Goals: Good ADL Goals Pt Will Transfer to Toilet: with max assist;Ambulation;3-in-1 ADL Goal: Toilet Transfer - Progress: Goal set today Miscellaneous OT Goals Miscellaneous OT Goal #1: Pt will complete bed mobility Min (A) as precursor to adls OT Goal: Miscellaneous Goal #1 - Progress: Goal set today  Visit Information  Last OT Received On: 08/19/12 Assistance Needed: +2    Subjective  Data  Subjective: "I had both my legs done"- pt with poor recall of situation, date , or time Patient Stated Goal: pt verbalized "my wife will be here"   Prior Functioning     Home Living Lives With: Spouse Available Help at Discharge: Available 24 hours/day Type of Home: House Home Access: Stairs to enter [employer] of Steps: 2 Entrance Stairs-Rails: Right;Left;Can reach both Home Layout: One level Bathroom Shower/Tub:  (pt poor historian) Firefighter: Handicapped height Home Adaptive Equipment: Straight cane;Walker - rolling;Shower chair with back Prior Function Level of Independence: Independent with assistive device(s) Able to Take Stairs?: Yes Driving: Yes Vocation: Retired Musician: No difficulties Dominant Hand: Right         Vision/Perception     Cognition  Overall Cognitive Status: Impaired Area of Impairment: Attention;Memory;Following commands;Safety/judgement;Awareness of errors;Awareness of deficits;Problem solving Arousal/Alertness: Awake/alert Orientation Level: Disoriented to;Place;Time;Situation;Person Behavior During Session: [facility] for tasks performed Current Attention Level: Sustained Memory: Decreased recall of precautions Memory Deficits: pt with no recall of surgery or daily events. Pt unable to problem solve with max questioning cues "what month is Christmas in?"  "what month does [place] come?" Pt unabel to recall December Following Commands: Follows one step commands consistently;Follows one step commands with increased time Safety/Judgement: Decreased awareness of safety precautions;Decreased safety judgement for tasks assessed Awareness of Errors: Assistance required to identify errors made;Assistance required to correct errors made    Extremity/Trunk Assessment Right Upper Extremity Assessment RUE ROM/Strength/Tone: [facility] for tasks assessed Left Upper Extremity Assessment LUE ROM/Strength/Tone: [facility] for tasks  assessed Trunk Assessment Trunk Assessment: Normal     Mobility Bed Mobility Supine to Sit: 1: +2 Total assist;HOB elevated;With rails Supine to Sit: Patient Percentage: 40% Sitting -  Scoot to [place] of Bed: 3: Mod assist Transfers Sit to Stand: 1: +2 Total assist;With upper extremity assist;From bed Sit to Stand: Patient Percentage: 70% Stand to Sit: 1: +2 Total assist;With upper extremity assist;To chair/3-in-1 Stand to Sit: Patient Percentage: 70% Details for Transfer Assistance: pt unable to sequence steppign with RW.      Shoulder Instructions     Exercise     Balance     End of Session OT - End of Session Activity Tolerance: Patient tolerated treatment well Patient left: in chair;with call bell/phone within reach Nurse Communication: Mobility status;Precautions  GO     Lucile Shutters 08/19/2012, 1:30 PM Pager: [phone]

## 2012-08-20 DIAGNOSIS — S7223XA Displaced subtrochanteric fracture of unspecified femur, initial encounter for closed fracture: Principal | ICD-10-CM

## 2012-08-20 DIAGNOSIS — C3430 Malignant neoplasm of lower lobe, unspecified bronchus or lung: Secondary | ICD-10-CM

## 2012-08-20 DIAGNOSIS — E785 Hyperlipidemia, unspecified: Secondary | ICD-10-CM

## 2012-08-20 LAB — CBC
HCT: 27 % — ABNORMAL LOW (ref 39.0–52.0)
MCHC: 33.7 g/dL (ref 30.0–36.0)
Platelets: 166 10*3/uL (ref 150–400)
RDW: 14.2 % (ref 11.5–15.5)

## 2012-08-20 LAB — BASIC METABOLIC PANEL
BUN: 17 mg/dL (ref 6–23)
Creatinine, Ser: 0.88 mg/dL (ref 0.50–1.35)
GFR calc Af Amer: >90 mL/min (ref >90)
GFR calc non Af Amer: 78 mL/min — ABNORMAL LOW (ref >90)
Potassium: 3.8 mEq/L (ref 3.5–5.1)

## 2012-08-20 LAB — TYPE AND SCREEN: Unit division: 0

## 2012-08-20 LAB — MAGNESIUM: Magnesium: 1.8 mg/dL (ref 1.5–2.5)

## 2012-08-20 MED ORDER — POLYETHYLENE GLYCOL 3350 17 G PO PACK
17.0000 g | PACK | Freq: Every day | ORAL | Status: DC
  Administered 2012-08-20 – 2012-08-22 (×3): 17 g via ORAL
  Filled 2012-08-20 (×2): qty 1

## 2012-08-20 MED ORDER — MAGNESIUM SULFATE IN D5W 10-5 MG/ML-% IV SOLN
1.0000 g | Freq: Once | INTRAVENOUS | Status: AC
  Administered 2012-08-20: 1 g via INTRAVENOUS
  Filled 2012-08-20 (×2): qty 100

## 2012-08-20 NOTE — Progress Notes (Signed)
[facility]                                                                                Patient Demographics  [name], is a 78 y.o. male  ZOX:[id number]  [id number]:[id number]  DOB - 13-Aug-1928  Admit date - 08/17/2012  Admitting Physician Eulas Post, MD  Outpatient Primary MD for the patient is [name], MD  LOS - 3   Chief Complaint  Patient presents with  . Fall  . Hip Pain        Assessment & Plan    Principal Problem:  *Closed left subtrochanteric femur fracture Active Problems:  MALIGNANT NEOPLASM LOWER LOBE BRONCHUS OR LUNG  HYPERLIPIDEMIA  HYPERTENSION  COPD  1. Left subtrochanteric femoral fracture, likely mechanical fall, given altered mental status , CT of the head was negative, he has some baseline dementia per family.  Urinalysis negative, chest x-ray negative. CT scan of the chest on 12/6 shows no obvious infiltrate. He underwent open reduction and internal fixation of left femur by Dr. Dion Saucier on 08/17/2012, a new PT, Lovenox for DVT prophylaxis, will need placement. Patient is symptom free mental status back to baseline which is mild dementia.    2. Hypertension -low-dose beta blocker as tolerated. When necessary hydralazine.     3. History of neuroendocrine tumor, apparently stable, outpatient CT every 6 months according to his oncologist    4. Syncopal episode, patient had a near-syncopal episode when getting out of bed 08/18/2012, he could have been orthostatic.his cardiac enzymes have been negative, 2-D echo was within normal limits at EF over 55%, his magnesium was found to be 1.2 and has been replaced, current magnesium is stable, stable on telemetry, repeat EKG on 08/20/2012 negative, will check one more set of troponin multiple sets have been negative. He's chest pain or palpitation free. Continue low-dose beta blocker as tolerated    5. Anemia hemoglobin has dropped from 15.2-9.7. He was transfused with 1  unit of packed red blood cells due to symptomatic anemia on 08/19/2012, a near-syncopal episode, we will stop all IV fluids and monitor H&H closely.    6. Frequent PVCs secondary to low magnesium, magnesium has been replaced in stable, echo shows EF of 455%, repeat EKG on 12-14 negative, has been started on metoprolol, completely symptom-free. No further PVCs overnight.   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 The patient apparently tripped and fell.Per report from [facility] pt was found outside at home. He reportedly slipped while walking. He is noted to have a altered LOC (alert to person, and place) not oriented to time or year. He is noted to have swelling to his L hip with shortening and external rotation to his L leg. + pedal pulses present bilaterally.  He denies any intercurrent illness, fever, and he has not been sick lately  Consultants:  Ivin Booty  Robie Ridge, MD  Procedures:  INTRAMEDULLARY (IM) NAIL LEFT SUBTROCHANTERIC FEMUR FRACTURE  Antibiotics:  None     Code Status: Full  Family Communication: Discussed with the patient and wife bedside  Disposition Plan: SNF   Procedures  - INTRAMEDULLARY (IM) NAIL LEFT SUBTROCHANTERIC FEMUR FRACTURE    Consults  Orthopedics Eulas Post, MD    DVT Prophylaxis  Lovenox    Lab Results  Component Value Date   PLT 166 08/20/2012    Medications  Scheduled Meds:    . atorvastatin  40 mg Oral q1800  . docusate sodium  100 mg Oral BID  . enoxaparin (LOVENOX) injection  40 mg Subcutaneous Q24H  . levothyroxine  112 mcg Oral QAC breakfast  . loratadine  10 mg Oral  Daily  . magnesium sulfate 1 - 4 g bolus IVPB  1 g Intravenous Once  . metoprolol tartrate  12.5 mg Oral BID  . multivitamin with minerals  1 tablet Oral Daily  . polyethylene glycol  17 g Oral Daily  . senna  1 tablet Oral BID   Continuous Infusions:  PRN Meds:.acetaminophen, alum & mag hydroxide-simeth, HYDROcodone-acetaminophen, levalbuterol, ondansetron (ZOFRAN) IV, polyethylene glycol, sorbitol  Antibiotics    Anti-infectives     Start     Dose/Rate Route Frequency Ordered Stop   08/18/12 0200   ceFAZolin (ANCEF) IVPB 2 g/50 mL premix        2 g 100 mL/hr over 30 Minutes Intravenous Every 6 hours 08/17/12 2313 08/18/12 0829   08/17/12 1924   ceFAZolin (ANCEF) IVPB 2 g/50 mL premix  Status:  Discontinued        2 g 100 mL/hr over 30 Minutes Intravenous 30 min pre-op 08/17/12 1924 08/17/12 2313           Time Spent in minutes   40   SINGH,PRASHANT K M.D on 08/20/2012 at 9:02 AM  Between 7am to 7pm - Pager - [phone]  After 7pm go to [email or web address] - password [email or web address]  And look for the night coverage person covering for me after hours  [facility] Office  [phone]    Subjective:   [name] today has, No headache, No chest pain, No abdominal pain - No Nausea, No new weakness tingling or numbness, No Cough - SOB.    Objective:   Filed Vitals:   08/19/12 1245 08/19/12 2200 08/20/12 0557 08/20/12 0800  BP: 124/65 122/55 107/39   Pulse: 100 94 77   Temp: 98.7 F (37.1 C) 99.5 F (37.5 C) 98 F (36.7 C)   TempSrc: Oral     Resp: 16 16 16 18   Height:      Weight:      SpO2:  92% 93% 100%    Wt Readings from Last 3 Encounters:  08/17/12 99.791 kg (220 lb)  08/17/12 99.791 kg (220 lb)  08/15/12 95.573 kg (210 lb 11.2 oz)     Intake/Output Summary (Last 24 hours) at 08/20/12 0902 Last data filed at 08/19/12 1851  Gross per 24 hour  Intake    840 ml  Output    450 ml  Net    390 ml    Exam Awake Alert, Oriented X 3, No new F.N  deficits, Normal affect [place].AT,PERRAL Supple Neck,No JVD, No cervical lymphadenopathy appriciated.  Symmetrical Chest wall movement, Good air movement bilaterally, CTAB RRR,No Gallops,Rubs or new Murmurs, No Parasternal Heave +ve B.Sounds, Abd Soft, Non tender, No organomegaly appriciated, No rebound - guarding or rigidity.  No Cyanosis, Clubbing or edema, No new Rash or bruise.   Data Review   Micro Results No results found for this or any previous visit (from the past 240 hour(s)).  Radiology Reports Dg Chest 1 View  08/17/2012  *RADIOLOGY REPORT*  Clinical Data: Fall.  History of lobectomy.  CHEST - 1 VIEW  Comparison: Chest CT from 08/12/2012  Findings: The lungs are clear without focal infiltrate, edema, pneumothorax or pleural effusion. Interstitial markings are diffusely coarsened with chronic features. Cardiopericardial silhouette is at upper limits of normal for size.  Bones are diffusely demineralized. Telemetry leads overlie the chest.  Gas distended bowel loops are noted in the visualized upper abdomen.  IMPRESSION: Borderline cardiomegaly with chronic interstitial coarsening but no acute cardiopulmonary findings.   Original Report Authenticated By: Kennith Center, M.D.    Dg Hip Complete Left  08/17/2012  *RADIOLOGY REPORT*  Clinical Data: Left hip pain  LEFT HIP - COMPLETE 2+ VIEW  Comparison: None.  Findings: Frontal pelvis with AP and cross-table lateral views of the left hip show a comminuted left intertrochanteric hip fracture with varus angulation.  Bones are diffusely demineralized.  SI joints and symphysis pubis are unremarkable.  IMPRESSION: Comminuted left intertrochanteric proximal femur fracture with varus angulation.   Original Report Authenticated By: Kennith Center, M.D.    Dg Femur Left  08/17/2012  *RADIOLOGY REPORT*  Clinical Data: ORIF left femur  LEFT FEMUR - 2 VIEW  Comparison: 08/17/2012  Findings: Intraoperative spot fluoroscopic views are obtained of the left  hip and left knee for surgical control purposes.  Images demonstrate a interval internal reduction and intramedullary rod and screw fixation of fractures of the intertrochanteric left hip. Distal locking screw in the distal femoral metaphysis.  Improved alignment and position of the fracture fragments.  IMPRESSION: Intraoperative fluoroscopy of the left hip and left knee obtained for surgical control purposes.  Reduction and intramedullary rod and screw fixation of intertrochanteric active fractures.   Original Report Authenticated By: Burman Nieves, M.D.    Ct Head Wo Contrast  08/17/2012  *RADIOLOGY REPORT*  Clinical Data: Altered mental status  CT HEAD WITHOUT CONTRAST  Technique:  Contiguous axial images were obtained from the base of the skull through the vertex without contrast.  Comparison: MRI 10/14/2007  Findings: Generalized atrophy of a moderate degree.  Negative for acute infarct.  Negative for hemorrhage or mass.  Calvarium is intact.  IMPRESSION: Generalized atrophy without acute abnormality.   Original Report Authenticated By: Janeece Riggers, M.D.    Ct Chest W Contrast  08/12/2012  *RADIOLOGY REPORT*  Clinical Data: Malignant neoplasm of lower lobe.  Lung cancer with surgery only.  Colon cancer with chemotherapy completed.  No current complaints.  Stage to be non-small cell lung cancer. Diagnosed 07/2007.  Status post left lower lobectomy with lymph node dissection.  Status post four cycles of chemotherapy.  CT CHEST WITH CONTRAST  Technique:  Multidetector CT imaging of the chest was performed following the standard protocol during bolus administration of intravenous contrast.  Contrast: 80mL OMNIPAQUE IOHEXOL 300 MG/ML  SOLN  Comparison: 02/10/2012 and the clinic note of 02/15/2012.  Findings: Lungs/pleura: Irregularity of the nondependent trachea on image 20/series 5 which is favored to be due to retained secretions; absent on the prior exam.  Status post left lower lobectomy.  Mild  centrilobular emphysema. No nodules or airspace opacities. No pleural fluid.    Minimal left- sided pleural thickening which is unchanged.  Heart/Mediastinum: No supraclavicular adenopathy.  Atherosclerosis with  moderate stenosis involving the left subclavian artery and 11/series 2.  Unchanged.  Mild cardiomegaly.  Coronary artery atherosclerosis with mitral annular calcifications.  No pericardial fluid. No central pulmonary embolism, on this non-dedicated study.  Small mediastinal nodes which are stable.  No hilar adenopathy.  Upper abdomen: Stable mild left adrenal nodularity.  Similar mild right adrenal thickening.  Small low-density bilateral renal lesions which are likely cysts.  Bones/Musculoskeletal:  Moderate osteopenia.  Remote right rib trauma.  IMPRESSION:  1.  Surgical changes of left lower lobectomy, without recurrent or metastatic disease. 2.  Incidental findings, including moderate narrowing of the proximal left subclavian artery and coronary artery disease.   Original Report Authenticated By: Jeronimo Greaves, M.D.    Dg Pelvis Portable  08/17/2012  *RADIOLOGY REPORT*  Clinical Data: Postoperative radiographs of the left hip.  PORTABLE PELVIS  Comparison: 08/17/2012.  Findings: New left hip gamma nail.  Intertrochanteric fracture fragments appear unchanged from intraoperative fluoroscopy.  The alignment is improved compared to preoperative.  Dense atherosclerosis.  IMPRESSION: New uncomplicated left hip gamma nail.   Original Report Authenticated By: Andreas Newport, M.D.    Dg Hip Portable 1 View Left  08/17/2012  *RADIOLOGY REPORT*  Clinical Data: Left hip fracture.  ORIF.  PORTABLE LEFT HIP - 1 VIEW  Comparison: 08/17/2012.  Findings: New left hip gamma nail across intertrochanteric left femur fracture.  Long antegrade femoral nail with distal interlocking screws.  Distal aspect of the femur appears within normal limits.  Knee osteoarthritis is visualized.  Expected postsurgical changes in the  soft tissues.  Nondiagnostic lateral views due to under penetration.  IMPRESSION: New left hip gamma nail fixation.  No complication.   Original Report Authenticated By: Andreas Newport, M.D.    Dg C-arm 61-120 Min-no Report  08/17/2012  CLINICAL DATA: ORIF Left Femur   C-ARM 61-120 MINUTES  Fluoroscopy was utilized by the requesting physician.  No radiographic  interpretation.      CBC  Lab 08/20/12 0515 08/19/12 0512 08/18/12 0630 08/17/12 1222  WBC 9.2 10.1 9.9 11.7*  HGB 9.1* 9.7* 11.4* 14.6  HCT 27.0* 28.3* 35.0* 43.2  PLT 166 166 183 186  MCV 87.7 87.9 89.7 88.7  MCH 29.5 30.1 29.2 30.0  MCHC 33.7 34.3 32.6 33.8  RDW 14.2 13.5 13.5 13.0  LYMPHSABS -- -- -- 0.7  MONOABS -- -- -- 0.6  EOSABS -- -- -- 0.0  BASOSABS -- -- -- 0.0  BANDABS -- -- -- --    Chemistries   Lab 08/20/12 0515 08/19/12 0512 08/18/12 0630 08/17/12 1630 08/17/12 1222  NA 138 -- 139 -- 138  K 3.8 -- 4.1 -- 4.6  CL 99 -- 96 -- 98  CO2 34* -- 30 -- 31  GLUCOSE 107* -- 124* -- 143*  BUN 17 -- 15 -- 12  CREATININE 0.88 -- 0.87 -- 0.82  CALCIUM 8.7 -- 8.9 -- 9.8  MG 1.8 2.2 1.2* -- --  AST -- -- -- 20 --  ALT -- -- -- 14 --  ALKPHOS -- -- -- 67 --  BILITOT -- -- -- 0.5 --   ------------------------------------------------------------------------------------------------------------------ estimated creatinine clearance is 79.2 ml/min (by C-G formula based on Cr of 0.88). ------------------------------------------------------------------------------------------------------------------ No results found for this basename: HGBA1C:2 in the last 72 hours ------------------------------------------------------------------------------------------------------------------ No results found for this basename: CHOL:2,HDL:2,LDLCALC:2,TRIG:2,CHOLHDL:2,LDLDIRECT:2 in the last 72 hours ------------------------------------------------------------------------------------------------------------------  Basename 08/18/12  0630  TSH 1.372  T4TOTAL --  T3FREE --  THYROIDAB --   ------------------------------------------------------------------------------------------------------------------ No results found  for this basename: VITAMINB12:2,FOLATE:2,FERRITIN:2,TIBC:2,IRON:2,RETICCTPCT:2 in the last 72 hours  Coagulation profile  Lab 08/17/12 1222  INR 1.05  PROTIME --    No results found for this basename: DDIMER:2 in the last 72 hours  Cardiac Enzymes  Lab 08/18/12 2326 08/18/12 1701 08/18/12 1155  CKMB -- -- --  TROPONINI <0.30 <0.30 <0.30  MYOGLOBIN -- -- --   ------------------------------------------------------------------------------------------------------------------ No components found with this basename: POCBNP:3

## 2012-08-20 NOTE — Progress Notes (Signed)
Patient ID: [name], male   DOB: Aug 27, 1928, 78 y.o.   MRN: [id number]     Subjective:  Patient reports pain as mild to moderate.  Doing better has no pain while sitting ready for SNF on Monday   Objective:   VITALS:   Filed Vitals:   08/19/12 1245 08/19/12 2200 08/20/12 0557 08/20/12 0800  BP: 124/65 122/55 107/39   Pulse: 100 94 77   Temp: 98.7 F (37.1 C) 99.5 F (37.5 C) 98 F (36.7 C)   TempSrc: Oral     Resp: 16 16 16 18   Height:      Weight:      SpO2:  92% 93% 100%    ABD soft Sensation intact distally Dorsiflexion/Plantar flexion intact Incision: dressing C/D/I and no drainage  LABS  Results for orders placed during the hospital encounter of 08/17/12 (from the past 24 hour(s))  CBC     Status: Abnormal   Collection Time   08/20/12  5:15 AM      Component Value Range   WBC 9.2  4.0 - 10.5 K/uL   RBC 3.08 (*) 4.22 - 5.81 MIL/uL   Hemoglobin 9.1 (*) 13.0 - 17.0 g/dL   HCT 40.9 (*) 81.1 - 91.4 %   MCV 87.7  78.0 - 100.0 fL   MCH 29.5  26.0 - 34.0 pg   MCHC 33.7  30.0 - 36.0 g/dL   RDW 78.2  95.6 - 21.3 %   Platelets 166  150 - 400 K/uL  BASIC METABOLIC PANEL     Status: Abnormal   Collection Time   08/20/12  5:15 AM      Component Value Range   Sodium 138  135 - 145 mEq/L   Potassium 3.8  3.5 - 5.1 mEq/L   Chloride 99  96 - 112 mEq/L   CO2 34 (*) 19 - 32 mEq/L   Glucose, Bld 107 (*) 70 - 99 mg/dL   BUN 17  6 - 23 mg/dL   Creatinine, Ser 0.86  0.50 - 1.35 mg/dL   Calcium 8.7  8.4 - 57.8 mg/dL   GFR calc non Af Amer 78 (*) >90 mL/min   GFR calc Af Amer >90  >90 mL/min  MAGNESIUM     Status: Normal   Collection Time   08/20/12  5:15 AM      Component Value Range   Magnesium 1.8  1.5 - 2.5 mg/dL  TROPONIN I     Status: Normal   Collection Time   08/20/12  9:22 AM      Component Value Range   Troponin I <0.30  <0.30 ng/mL    No results found.  Assessment/Plan: 3 Days Post-Op   Principal Problem:  *Closed left subtrochanteric femur  fracture Active Problems:  MALIGNANT NEOPLASM LOWER LOBE BRONCHUS OR LUNG  HYPERLIPIDEMIA  HYPERTENSION  COPD   Advance diet Up with therapy Plan for SNF on Monday   Haskel Khan 08/20/2012, 11:49 AM   Teryl Lucy, MD Cell [phone] Pager [phone]

## 2012-08-21 DIAGNOSIS — J449 Chronic obstructive pulmonary disease, unspecified: Secondary | ICD-10-CM

## 2012-08-21 MED ORDER — LORAZEPAM 0.5 MG PO TABS: 0.5000 mg | ORAL_TABLET | Freq: Once | ORAL | Status: DC

## 2012-08-21 NOTE — Progress Notes (Signed)
[facility]                                                                                Patient Demographics  [name], is a 78 y.o. male  WUJ:[id number]  NFA:[id number]  DOB - [birth year]  Admit date - 08/17/2012  Admitting Physician Eulas Post, MD  Outpatient Primary MD for the patient is [name], MD  LOS - 4   Chief Complaint  Patient presents with  . Fall  . 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 The patient apparently tripped and fell.Per report from [facility] pt was found outside at home. He reportedly slipped while walking. He is noted to have a altered LOC (alert to person, and place) not oriented to time or year. He is noted to have swelling to his L hip with shortening and external rotation to his L leg. + pedal pulses present bilaterally.   He denies any intercurrent illness, fever, and he has not been sick lately       1. Left subtrochanteric femoral fracture, likely mechanical fall, given altered mental status , CT of the head was negative, he has some baseline dementia per family.  Urinalysis negative, chest x-ray negative. CT scan of the chest on 12/6 shows no obvious infiltrate. He underwent open reduction and internal fixation of left femur by Dr. Dion Saucier on 08/17/2012, a new PT, Lovenox for DVT prophylaxis, will need placement. Patient is symptom free mental status back to baseline which is mild dementia.    2. Hypertension -low-dose beta blocker as tolerated. When necessary hydralazine.     3. History of neuroendocrine tumor, apparently stable, outpatient CT every 6 months according to his oncologist    4. Syncopal episode, patient had a near-syncopal episode when getting  out of bed 08/18/2012, he could have been orthostatic.his cardiac enzymes have been negative, 2-D echo was within normal limits at EF over 55%, his magnesium was found to be 1.2 and has been replaced, current magnesium is stable, stable on telemetry, repeat EKG on 08/20/2012 negative, will check one more set of troponin multiple sets have been negative. He's chest pain or palpitation free. Continue low-dose beta blocker as tolerated    5. Anemia hemoglobin has dropped from 15.2-9.7. He was transfused with 1 unit of packed red blood cells due to symptomatic anemia on 08/19/2012, a near-syncopal episode, we will stop all IV fluids and monitor H&H closely.    6. Frequent PVCs secondary to low magnesium, magnesium has been replaced in stable, echo shows EF of 455%, repeat EKG on 12-14 negative, has been started on metoprolol, completely symptom-free. No further PVCs overnight.      Code Status: Full  Family Communication: Discussed with the patient and wife bedside  Disposition Plan: SNF   Procedures  - INTRAMEDULLARY (IM) NAIL LEFT SUBTROCHANTERIC FEMUR FRACTURE    Consults  Orthopedics Eulas Post, MD  DVT Prophylaxis  Lovenox    Lab Results  Component Value Date   PLT 166 08/20/2012    Medications  Scheduled Meds:    . atorvastatin  40 mg Oral q1800  . docusate sodium  100 mg Oral BID  . enoxaparin (LOVENOX) injection  40 mg Subcutaneous Q24H  . levothyroxine  112 mcg Oral QAC breakfast  . loratadine  10 mg Oral Daily  . LORazepam  0.5 mg Oral Once  . metoprolol tartrate  12.5 mg Oral BID  . multivitamin with minerals  1 tablet Oral Daily  . polyethylene glycol  17 g Oral Daily  . senna  1 tablet Oral BID   Continuous Infusions:  PRN Meds:.acetaminophen, alum & mag hydroxide-simeth, HYDROcodone-acetaminophen, levalbuterol, ondansetron (ZOFRAN) IV, polyethylene glycol, sorbitol  Antibiotics    Anti-infectives     Start     Dose/Rate Route Frequency Ordered  Stop   08/18/12 0200   ceFAZolin (ANCEF) IVPB 2 g/50 mL premix        2 g 100 mL/hr over 30 Minutes Intravenous Every 6 hours 08/17/12 2313 08/18/12 0829   08/17/12 1924   ceFAZolin (ANCEF) IVPB 2 g/50 mL premix  Status:  Discontinued        2 g 100 mL/hr over 30 Minutes Intravenous 30 min pre-op 08/17/12 1924 08/17/12 2313           Time Spent in minutes   40   Alee Gressman K M.D on 08/21/2012 at 9:04 AM  Between 7am to 7pm - Pager - [phone]  After 7pm go to [email or web address] - password [email or web address]  And look for the night coverage person covering for me after hours  [facility] Office  [phone]    Subjective:   [name] today has, No headache, No chest pain, No abdominal pain - No Nausea, No new weakness tingling or numbness, No Cough - SOB.    Objective:   Filed Vitals:   08/20/12 1226 08/20/12 1748 08/20/12 2339 08/21/12 0800  BP: 116/67 121/57 118/98   Pulse: 79 81 88   Temp: 98.3 F (36.8 C) 98 F (36.7 C) 98.1 F (36.7 C)   TempSrc: Oral Oral Oral   Resp: 18 16 16 18   Height:      Weight:      SpO2: 92% 96% 93% 100%    Wt Readings from Last 3 Encounters:  08/17/12 99.791 kg (220 lb)  08/17/12 99.791 kg (220 lb)  08/15/12 95.573 kg (210 lb 11.2 oz)     Intake/Output Summary (Last 24 hours) at 08/21/12 0904 Last data filed at 08/20/12 1226  Gross per 24 hour  Intake    240 ml  Output      0 ml  Net    240 ml    Exam Awake Alert, Oriented X 3, No new F.N deficits, Normal affect [place].AT,PERRAL Supple Neck,No JVD, No cervical lymphadenopathy appriciated.  Symmetrical Chest wall movement, Good air movement bilaterally, CTAB RRR,No Gallops,Rubs or new Murmurs, No Parasternal Heave +ve B.Sounds, Abd Soft, Non tender, No organomegaly appriciated, No rebound - guarding or rigidity. No Cyanosis, Clubbing or edema, No new Rash or bruise.   Data Review   Micro Results No results found for this or any previous visit (from the past  240 hour(s)).  Radiology Reports Dg Chest 1 View  08/17/2012  *RADIOLOGY REPORT*  Clinical Data: Fall.  History of lobectomy.  CHEST - 1 VIEW  Comparison: Chest CT from 08/12/2012  Findings: The lungs are  clear without focal infiltrate, edema, pneumothorax or pleural effusion. Interstitial markings are diffusely coarsened with chronic features. Cardiopericardial silhouette is at upper limits of normal for size.  Bones are diffusely demineralized. Telemetry leads overlie the chest.  Gas distended bowel loops are noted in the visualized upper abdomen.  IMPRESSION: Borderline cardiomegaly with chronic interstitial coarsening but no acute cardiopulmonary findings.   Original Report Authenticated By: Kennith Center, M.D.    Dg Hip Complete Left  08/17/2012  *RADIOLOGY REPORT*  Clinical Data: Left hip pain  LEFT HIP - COMPLETE 2+ VIEW  Comparison: None.  Findings: Frontal pelvis with AP and cross-table lateral views of the left hip show a comminuted left intertrochanteric hip fracture with varus angulation.  Bones are diffusely demineralized.  SI joints and symphysis pubis are unremarkable.  IMPRESSION: Comminuted left intertrochanteric proximal femur fracture with varus angulation.   Original Report Authenticated By: Kennith Center, M.D.    Dg Femur Left  08/17/2012  *RADIOLOGY REPORT*  Clinical Data: ORIF left femur  LEFT FEMUR - 2 VIEW  Comparison: 08/17/2012  Findings: Intraoperative spot fluoroscopic views are obtained of the left hip and left knee for surgical control purposes.  Images demonstrate a interval internal reduction and intramedullary rod and screw fixation of fractures of the intertrochanteric left hip. Distal locking screw in the distal femoral metaphysis.  Improved alignment and position of the fracture fragments.  IMPRESSION: Intraoperative fluoroscopy of the left hip and left knee obtained for surgical control purposes.  Reduction and intramedullary rod and screw fixation of  intertrochanteric active fractures.   Original Report Authenticated By: Burman Nieves, M.D.    Ct Head Wo Contrast  08/17/2012  *RADIOLOGY REPORT*  Clinical Data: Altered mental status  CT HEAD WITHOUT CONTRAST  Technique:  Contiguous axial images were obtained from the base of the skull through the vertex without contrast.  Comparison: MRI 10/14/2007  Findings: Generalized atrophy of a moderate degree.  Negative for acute infarct.  Negative for hemorrhage or mass.  Calvarium is intact.  IMPRESSION: Generalized atrophy without acute abnormality.   Original Report Authenticated By: Janeece Riggers, M.D.    Ct Chest W Contrast  08/12/2012  *RADIOLOGY REPORT*  Clinical Data: Malignant neoplasm of lower lobe.  Lung cancer with surgery only.  Colon cancer with chemotherapy completed.  No current complaints.  Stage to be non-small cell lung cancer. Diagnosed 07/2007.  Status post left lower lobectomy with lymph node dissection.  Status post four cycles of chemotherapy.  CT CHEST WITH CONTRAST  Technique:  Multidetector CT imaging of the chest was performed following the standard protocol during bolus administration of intravenous contrast.  Contrast: 80mL OMNIPAQUE IOHEXOL 300 MG/ML  SOLN  Comparison: 02/10/2012 and the clinic note of 02/15/2012.  Findings: Lungs/pleura: Irregularity of the nondependent trachea on image 20/series 5 which is favored to be due to retained secretions; absent on the prior exam.  Status post left lower lobectomy.  Mild centrilobular emphysema. No nodules or airspace opacities. No pleural fluid.    Minimal left- sided pleural thickening which is unchanged.  Heart/Mediastinum: No supraclavicular adenopathy.  Atherosclerosis with moderate stenosis involving the left subclavian artery and 11/series 2.  Unchanged.  Mild cardiomegaly.  Coronary artery atherosclerosis with mitral annular calcifications.  No pericardial fluid. No central pulmonary embolism, on this non-dedicated study.  Small  mediastinal nodes which are stable.  No hilar adenopathy.  Upper abdomen: Stable mild left adrenal nodularity.  Similar mild right adrenal thickening.  Small low-density bilateral renal lesions which are  likely cysts.  Bones/Musculoskeletal:  Moderate osteopenia.  Remote right rib trauma.  IMPRESSION:  1.  Surgical changes of left lower lobectomy, without recurrent or metastatic disease. 2.  Incidental findings, including moderate narrowing of the proximal left subclavian artery and coronary artery disease.   Original Report Authenticated By: Jeronimo Greaves, M.D.    Dg Pelvis Portable  08/17/2012  *RADIOLOGY REPORT*  Clinical Data: Postoperative radiographs of the left hip.  PORTABLE PELVIS  Comparison: 08/17/2012.  Findings: New left hip gamma nail.  Intertrochanteric fracture fragments appear unchanged from intraoperative fluoroscopy.  The alignment is improved compared to preoperative.  Dense atherosclerosis.  IMPRESSION: New uncomplicated left hip gamma nail.   Original Report Authenticated By: Andreas Newport, M.D.    Dg Hip Portable 1 View Left  08/17/2012  *RADIOLOGY REPORT*  Clinical Data: Left hip fracture.  ORIF.  PORTABLE LEFT HIP - 1 VIEW  Comparison: 08/17/2012.  Findings: New left hip gamma nail across intertrochanteric left femur fracture.  Long antegrade femoral nail with distal interlocking screws.  Distal aspect of the femur appears within normal limits.  Knee osteoarthritis is visualized.  Expected postsurgical changes in the soft tissues.  Nondiagnostic lateral views due to under penetration.  IMPRESSION: New left hip gamma nail fixation.  No complication.   Original Report Authenticated By: Andreas Newport, M.D.    Dg C-arm 61-120 Min-no Report  08/17/2012  CLINICAL DATA: ORIF Left Femur   C-ARM 61-120 MINUTES  Fluoroscopy was utilized by the requesting physician.  No radiographic  interpretation.      CBC  Lab 08/21/12 0815 08/20/12 0515 08/19/12 0512 08/18/12 0630 08/17/12 1222   WBC -- 9.2 10.1 9.9 11.7*  HGB 9.3* 9.1* 9.7* 11.4* 14.6  HCT 27.3* 27.0* 28.3* 35.0* 43.2  PLT -- 166 166 183 186  MCV -- 87.7 87.9 89.7 88.7  MCH -- 29.5 30.1 29.2 30.0  MCHC -- 33.7 34.3 32.6 33.8  RDW -- 14.2 13.5 13.5 13.0  LYMPHSABS -- -- -- -- 0.7  MONOABS -- -- -- -- 0.6  EOSABS -- -- -- -- 0.0  BASOSABS -- -- -- -- 0.0  BANDABS -- -- -- -- --    Chemistries   Lab 08/20/12 0515 08/19/12 0512 08/18/12 0630 08/17/12 1630 08/17/12 1222  NA 138 -- 139 -- 138  K 3.8 -- 4.1 -- 4.6  CL 99 -- 96 -- 98  CO2 34* -- 30 -- 31  GLUCOSE 107* -- 124* -- 143*  BUN 17 -- 15 -- 12  CREATININE 0.88 -- 0.87 -- 0.82  CALCIUM 8.7 -- 8.9 -- 9.8  MG 1.8 2.2 1.2* -- --  AST -- -- -- 20 --  ALT -- -- -- 14 --  ALKPHOS -- -- -- 67 --  BILITOT -- -- -- 0.5 --   ------------------------------------------------------------------------------------------------------------------ estimated creatinine clearance is 79.2 ml/min (by C-G formula based on Cr of 0.88). ------------------------------------------------------------------------------------------------------------------ No results found for this basename: HGBA1C:2 in the last 72 hours ------------------------------------------------------------------------------------------------------------------ No results found for this basename: CHOL:2,HDL:2,LDLCALC:2,TRIG:2,CHOLHDL:2,LDLDIRECT:2 in the last 72 hours ------------------------------------------------------------------------------------------------------------------ No results found for this basename: TSH,T4TOTAL,FREET3,T3FREE,THYROIDAB in the last 72 hours ------------------------------------------------------------------------------------------------------------------ No results found for this basename: VITAMINB12:2,FOLATE:2,FERRITIN:2,TIBC:2,IRON:2,RETICCTPCT:2 in the last 72 hours  Coagulation profile  Lab 08/17/12 1222  INR 1.05  PROTIME --    No results found for this basename:  DDIMER:2 in the last 72 hours  Cardiac Enzymes  Lab 08/20/12 0922 08/18/12 2326 08/18/12 1701  CKMB -- -- --  TROPONINI <0.30 <0.30 <0.30  MYOGLOBIN -- -- --   ------------------------------------------------------------------------------------------------------------------ No components found with this basename: POCBNP:3

## 2012-08-21 NOTE — Progress Notes (Signed)
Patient ID: [name], male   DOB: 26-Aug-1928, 78 y.o.   MRN: [id number]     Subjective:  Patient reports pain as mild.  He states that he is doing okay but has been slow to get up.  Objective:   VITALS:   Filed Vitals:   08/20/12 1748 08/20/12 2339 08/21/12 0800 08/21/12 1200  BP: 121/57 118/98    Pulse: 81 88    Temp: 98 F (36.7 C) 98.1 F (36.7 C)    TempSrc: Oral Oral    Resp: 16 16 18 18   Height:      Weight:      SpO2: 96% 93% 100% 100%    ABD soft Sensation intact distally Dorsiflexion/Plantar flexion intact Incision: dressing C/D/I and scant drainage  LABS  Results for orders placed during the hospital encounter of 08/17/12 (from the past 24 hour(s))  HEMOGLOBIN AND HEMATOCRIT, BLOOD     Status: Abnormal   Collection Time   08/21/12  8:15 AM      Component Value Range   Hemoglobin 9.3 (*) 13.0 - 17.0 g/dL   HCT 40.9 (*) 81.1 - 91.4 %    No results found.  Assessment/Plan: 4 Days Post-Op   Principal Problem:  *Closed left subtrochanteric femur fracture Active Problems:  MALIGNANT NEOPLASM LOWER LOBE BRONCHUS OR LUNG  HYPERLIPIDEMIA  HYPERTENSION  COPD   Advance diet Up with therapy Plan for discharge tomorrow to SNF   Haskel Khan 08/21/2012, 1:59 PM   Teryl Lucy, MD Cell [phone] Pager [phone]

## 2012-08-21 NOTE — Clinical Social Work Note (Signed)
Pt has accepted bed at [facility]. CSW notified [facility] admissions electronically that pt has accepted bed offer. Weekday CSW to f/u with [facility] and assist with d/c.  Dellie Burns, MSW, LCSWA [phone] (Weekends 8:00am-4:30pm)

## 2012-08-22 MED ORDER — HYDROCODONE-ACETAMINOPHEN 5-325 MG PO TABS: 1.0000 | ORAL_TABLET | Freq: Four times a day (QID) | ORAL | Status: DC | PRN

## 2012-08-22 MED ORDER — DSS 100 MG PO CAPS: 100.0000 mg | ORAL_CAPSULE | Freq: Two times a day (BID) | ORAL | Status: DC

## 2012-08-22 MED ORDER — POLYETHYLENE GLYCOL 3350 17 G PO PACK: 17.0000 g | PACK | Freq: Every day | ORAL | Status: DC | PRN

## 2012-08-22 MED ORDER — METOPROLOL TARTRATE 12.5 MG HALF TABLET: 12.5000 mg | ORAL_TABLET | Freq: Two times a day (BID) | ORAL | Status: DC

## 2012-08-22 NOTE — Progress Notes (Signed)
Pt to be discharged to [facility]. Patient in stable condition with no pain at this time.

## 2012-08-22 NOTE — Progress Notes (Signed)
Utilization review completed. Kyli Sorter, RN, BSN. 

## 2012-08-22 NOTE — Progress Notes (Signed)
Patient ID: [name], male   DOB: May 24, 1928, 78 y.o.   MRN: [id number]     Subjective:  Patient reports pain as mild to moderate.  Waiting on SNF placement  Objective:   VITALS:   Filed Vitals:   08/21/12 1510 08/21/12 2009 08/22/12 0515 08/22/12 0800  BP: 137/53 134/57 126/58   Pulse: 76 76 70   Temp: 98.1 F (36.7 C) 98 F (36.7 C) 97.3 F (36.3 C)   TempSrc: Oral Axillary Axillary   Resp: 18 17 16 13   Height:      Weight:      SpO2: 96% 93% 94% 94%    ABD soft Sensation intact distally Dorsiflexion/Plantar flexion intact Incision: dressing C/D/I and no drainage Wound clean and dry.  Dry dressing applied.  LABS  No results found for this or any previous visit (from the past 24 hour(s)).  No results found.  Assessment/Plan: 5 Days Post-Op   Principal Problem:  *Closed left subtrochanteric femur fracture Active Problems:  MALIGNANT NEOPLASM LOWER LOBE BRONCHUS OR LUNG  HYPERLIPIDEMIA  HYPERTENSION  COPD   Advance diet Up with therapy Discharge to SNF WBAT left lower ext Dry dressing PRN Continue plan per medicine   Haskel Khan 08/22/2012, 9:02 AM   Teryl Lucy, MD Cell [phone] Pager [phone]

## 2012-08-22 NOTE — Discharge Summary (Signed)
[facility]                                                                                   [name], is a 78 y.o. male  DOB July 05, 1928  MRN [id number].  Admission date:  08/17/2012  Discharge Date:  08/22/2012  Primary MD  [name], MD  Admitting Physician  Eulas Post, MD  Admission Diagnosis  Malignant neoplasm of lower lobe, bronchus, or lung [162.5] (MALIGNANT NEOPLASM LOWER LOBE BRONCHUS OR LUNG) Postop check [V67.00] Fracture of right hip [820.8] fall, Fracture of right hip  Discharge Diagnosis     Principal Problem:  *Closed left subtrochanteric femur fracture Active Problems:  MALIGNANT NEOPLASM LOWER LOBE BRONCHUS OR LUNG  HYPERLIPIDEMIA  HYPERTENSION  COPD    Past Medical History  Diagnosis Date  . Hyperlipidemia   . Hypertension   . Allergy   . Fractured shoulder 8/30?    pt fell about 3 weeks ago and fractured shoulder blade   . Fracture, intertrochanteric, left femur 08/17/2012  . COPD (chronic obstructive pulmonary disease)   . colon ca dx'd 1994    chemo  . Lung cancer dx'd 07/2007    lu-lobectomy    Past Surgical History  Procedure Date  . Colon surgery   . Colostomy takedown   . Colostomy revision   . Hernia repair   . Femur fracture surgery 08/17/2012  . Intramedullary (im) nail intertrochanteric 08/17/2012    Procedure: INTRAMEDULLARY (IM) NAIL INTERTROCHANTRIC;  Surgeon: Eulas Post, MD;  Location: [facility] OR;  Service: Orthopedics;  Laterality: Left;  Synthes GSN, Fracture Table, Big C arm     Discharge Diagnoses:   Principal Problem:  *Closed left subtrochanteric femur fracture Active Problems:  MALIGNANT NEOPLASM LOWER LOBE BRONCHUS OR LUNG  HYPERLIPIDEMIA  HYPERTENSION  COPD    Discharge Condition: Stable   Diet recommendation: See Discharge Instructions below   Consults - Dr. 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 The patient apparently tripped and fell.Per report from [facility] pt was found outside at home. He reportedly slipped while walking. He is noted to have a altered LOC (alert to person, and place) not oriented to time or year. He is noted to have swelling to his L hip with shortening and external rotation to his L leg. + pedal pulses present bilaterally. Was found to have left femoral fracture underwent open reduction internal fixation by Dr. Dion Saucier on 08/17/2012, he is tolerated the procedure well, he will be on Lovenox for DVT prophylaxis will benefit from ongoing physical therapy at rehabilitation, will follow with Dr. Dion Saucier as indicated in discharge instructions.    1. Left subtrochanteric femoral fracture, likely mechanical fall, given altered mental status , CT of the head was negative, he has some baseline dementia per family. Urinalysis negative, chest  x-ray negative. CT scan of the chest on 12/6 shows no obvious infiltrate. He underwent open reduction and internal fixation of left femur by Dr. Dion Saucier on 08/17/2012, a new PT, Lovenox for DVT prophylaxis, will benefit from rehabilitation placement. Patient is symptom free mental status back to baseline which is mild dementia.    2. Hypertension -stable on low-dose beta blocker continue.    3. History of neuroendocrine tumor, apparently stable, outpatient CT every 6 months according to his oncologist.   4. Syncopal episode, patient had a near-syncopal episode when getting out of bed 08/18/2012, he could have been orthostatic.his cardiac enzymes have been negative, 2-D echo was within normal limits at EF over 55%, his magnesium was found to be 1.2 and has been replaced, current  magnesium is stable, stable on telemetry, repeat EKG on 08/20/2012 negative, he is now completely symptom free without any dizziness or palpitations, continue PT and fall precautions. Outpatient followup with cardiologist as indicated below and #6.    5. Anemia hemoglobin has dropped from 15.2-9.7. He was transfused with 1 unit of packed red blood cells due to symptomatic anemia on 08/19/2012, a near-syncopal episode, H&H now stable no acute issues anymore.   6. Frequent PVCs secondary to low magnesium, magnesium has been replaced in stable, echo shows EF of 55%, repeat EKG on 12-14 negative, has been started on metoprolol, completely symptom-free. No further PVCs overnight. Will benefit from outpatient cardiology followup in one to 2 weeks for incidental finding of CAD on the CT scan.       Today   Subjective:   [name] today has no headache,no chest abdominal pain,no new weakness tingling or numbness, feels much better.  Objective:   Blood pressure 126/58, pulse 70, temperature 97.3 F (36.3 C), temperature source Axillary, resp. rate 13, height 6' (1.829 m), weight 99.791 kg (220 lb), SpO2 94.00%.   Intake/Output Summary (Last 24 hours) at 08/22/12 1117 Last data filed at 08/22/12 0900  Gross per 24 hour  Intake    720 ml  Output      0 ml  Net    720 ml    Exam Awake Alert, Oriented *3, No new F.N deficits, Normal affect [place].AT,PERRAL Supple Neck,No JVD, No cervical lymphadenopathy appriciated.  Symmetrical Chest wall movement, Good air movement bilaterally, CTAB RRR,No Gallops,Rubs or new Murmurs, No Parasternal Heave +ve B.Sounds, Abd Soft, Non tender, No organomegaly appriciated, No rebound -guarding or rigidity. No Cyanosis, Clubbing or edema, No new Rash or bruise  Data Review   Major procedures and Radiology Reports - PLEASE review detailed and final reports for all details in brief -    Dg Chest 1 View  08/17/2012  *RADIOLOGY REPORT*  Clinical Data:  Fall.  History of lobectomy.  CHEST - 1 VIEW  Comparison: Chest CT from 08/12/2012  Findings: The lungs are clear without focal infiltrate, edema, pneumothorax or pleural effusion. Interstitial markings are diffusely coarsened with chronic features. Cardiopericardial silhouette is at upper limits of normal for size.  Bones are diffusely demineralized. Telemetry leads overlie the chest.  Gas distended bowel loops are noted in the visualized upper abdomen.  IMPRESSION: Borderline cardiomegaly with chronic interstitial coarsening but no acute cardiopulmonary findings.   Original Report Authenticated By: Kennith Center, M.D.    Dg Hip Complete Left  08/17/2012  *RADIOLOGY REPORT*  Clinical Data: Left hip pain  LEFT HIP - COMPLETE 2+ VIEW  Comparison: None.  Findings: Frontal pelvis with AP and cross-table lateral views of the  left hip show a comminuted left intertrochanteric hip fracture with varus angulation.  Bones are diffusely demineralized.  SI joints and symphysis pubis are unremarkable.  IMPRESSION: Comminuted left intertrochanteric proximal femur fracture with varus angulation.   Original Report Authenticated By: Kennith Center, M.D.    Dg Femur Left  08/17/2012  *RADIOLOGY REPORT*  Clinical Data: ORIF left femur  LEFT FEMUR - 2 VIEW  Comparison: 08/17/2012  Findings: Intraoperative spot fluoroscopic views are obtained of the left hip and left knee for surgical control purposes.  Images demonstrate a interval internal reduction and intramedullary rod and screw fixation of fractures of the intertrochanteric left hip. Distal locking screw in the distal femoral metaphysis.  Improved alignment and position of the fracture fragments.  IMPRESSION: Intraoperative fluoroscopy of the left hip and left knee obtained for surgical control purposes.  Reduction and intramedullary rod and screw fixation of intertrochanteric active fractures.   Original Report Authenticated By: Burman Nieves, M.D.    Ct Head Wo  Contrast  08/17/2012  *RADIOLOGY REPORT*  Clinical Data: Altered mental status  CT HEAD WITHOUT CONTRAST  Technique:  Contiguous axial images were obtained from the base of the skull through the vertex without contrast.  Comparison: MRI 10/14/2007  Findings: Generalized atrophy of a moderate degree.  Negative for acute infarct.  Negative for hemorrhage or mass.  Calvarium is intact.  IMPRESSION: Generalized atrophy without acute abnormality.   Original Report Authenticated By: Janeece Riggers, M.D.    Ct Chest W Contrast  08/12/2012  *RADIOLOGY REPORT*  Clinical Data: Malignant neoplasm of lower lobe.  Lung cancer with surgery only.  Colon cancer with chemotherapy completed.  No current complaints.  Stage to be non-small cell lung cancer. Diagnosed 07/2007.  Status post left lower lobectomy with lymph node dissection.  Status post four cycles of chemotherapy.  CT CHEST WITH CONTRAST  Technique:  Multidetector CT imaging of the chest was performed following the standard protocol during bolus administration of intravenous contrast.  Contrast: 80mL OMNIPAQUE IOHEXOL 300 MG/ML  SOLN  Comparison: 02/10/2012 and the clinic note of 02/15/2012.  Findings: Lungs/pleura: Irregularity of the nondependent trachea on image 20/series 5 which is favored to be due to retained secretions; absent on the prior exam.  Status post left lower lobectomy.  Mild centrilobular emphysema. No nodules or airspace opacities. No pleural fluid.    Minimal left- sided pleural thickening which is unchanged.  Heart/Mediastinum: No supraclavicular adenopathy.  Atherosclerosis with moderate stenosis involving the left subclavian artery and 11/series 2.  Unchanged.  Mild cardiomegaly.  Coronary artery atherosclerosis with mitral annular calcifications.  No pericardial fluid. No central pulmonary embolism, on this non-dedicated study.  Small mediastinal nodes which are stable.  No hilar adenopathy.  Upper abdomen: Stable mild left adrenal nodularity.   Similar mild right adrenal thickening.  Small low-density bilateral renal lesions which are likely cysts.  Bones/Musculoskeletal:  Moderate osteopenia.  Remote right rib trauma.  IMPRESSION:  1.  Surgical changes of left lower lobectomy, without recurrent or metastatic disease. 2.  Incidental findings, including moderate narrowing of the proximal left subclavian artery and coronary artery disease.   Original Report Authenticated By: Jeronimo Greaves, M.D.    Dg Pelvis Portable  08/17/2012  *RADIOLOGY REPORT*  Clinical Data: Postoperative radiographs of the left hip.  PORTABLE PELVIS  Comparison: 08/17/2012.  Findings: New left hip gamma nail.  Intertrochanteric fracture fragments appear unchanged from intraoperative fluoroscopy.  The alignment is improved compared to preoperative.  Dense atherosclerosis.  IMPRESSION: New uncomplicated  left hip gamma nail.   Original Report Authenticated By: Andreas Newport, M.D.    Dg Hip Portable 1 View Left  08/17/2012  *RADIOLOGY REPORT*  Clinical Data: Left hip fracture.  ORIF.  PORTABLE LEFT HIP - 1 VIEW  Comparison: 08/17/2012.  Findings: New left hip gamma nail across intertrochanteric left femur fracture.  Long antegrade femoral nail with distal interlocking screws.  Distal aspect of the femur appears within normal limits.  Knee osteoarthritis is visualized.  Expected postsurgical changes in the soft tissues.  Nondiagnostic lateral views due to under penetration.  IMPRESSION: New left hip gamma nail fixation.  No complication.   Original Report Authenticated By: Andreas Newport, M.D.    Dg C-arm 61-120 Min-no Report  08/17/2012  CLINICAL DATA: ORIF Left Femur   C-ARM 61-120 MINUTES  Fluoroscopy was utilized by the requesting physician.  No radiographic  interpretation.      Micro Results     CBC w Diff: Lab Results  Component Value Date   WBC 9.2 08/20/2012   WBC 6.0 08/11/2012   HGB 9.3* 08/21/2012   HGB 15.2 08/11/2012   HCT 27.3* 08/21/2012   HCT 44.4  08/11/2012   PLT 166 08/20/2012   PLT 186 08/11/2012   LYMPHOPCT 6* 08/17/2012   LYMPHOPCT 21.2 08/11/2012   MONOPCT 5 08/17/2012   MONOPCT 8.6 08/11/2012   EOSPCT 0 08/17/2012   EOSPCT 2.4 08/11/2012   BASOPCT 0 08/17/2012   BASOPCT 0.3 08/11/2012    CMP: Lab Results  Component Value Date   NA 138 08/20/2012   NA 141 08/11/2012   K 3.8 08/20/2012   K 3.9 08/11/2012   CL 99 08/20/2012   CL 98 08/11/2012   CO2 34* 08/20/2012   CO2 34* 08/11/2012   BUN 17 08/20/2012   BUN 9.0 08/11/2012   CREATININE 0.88 08/20/2012   CREATININE 1.0 08/11/2012   PROT 6.5 08/17/2012   PROT 7.0 08/11/2012   ALBUMIN 3.4* 08/17/2012   ALBUMIN 3.5 08/11/2012   BILITOT 0.5 08/17/2012   BILITOT 0.80 08/11/2012   ALKPHOS 67 08/17/2012   ALKPHOS 79 08/11/2012   AST 20 08/17/2012   AST 20 08/11/2012   ALT 14 08/17/2012   ALT 17 08/11/2012  .   Discharge Instructions      Follow with Primary MD [name], MD in 3 days   Get CBC, CMP, checked 3 days by Primary MD and again as instructed by your Primary MD.    Get Medicines reviewed and adjusted.  Please request your Prim.MD to go over all Hospital Tests and Procedure/Radiological results at the follow up, please get all Hospital records sent to your Prim MD by signing hospital release before you go home.  Activity: Weight bearing As tolerated with Full fall precautions use walker/cane & assistance as needed   Diet:  Heart Healthy.  For Heart failure patients - Check your Weight same time everyday, if you gain over 2 pounds, or you develop in leg swelling, experience more shortness of breath or chest pain, call your Primary MD immediately. Follow Cardiac Low Salt Diet and 1.8 lit/day fluid restriction.  Disposition SNF  If you experience worsening of your admission symptoms, develop shortness of breath, life threatening emergency, suicidal or homicidal thoughts you must seek medical attention immediately by calling 911 or calling your MD  immediately  if symptoms less severe.  You Must read complete instructions/literature along with all the possible adverse reactions/side effects for all the Medicines you take and that  have been prescribed to you. Take any new Medicines after you have completely understood and accpet all the possible adverse reactions/side effects.   Do not drive and provide baby sitting services if your were admitted for syncope or siezures until you have seen by Primary MD or a Neurologist and advised to do so again.  Do not drive when taking Pain medications.    Do not take more than prescribed Pain, Sleep and Anxiety Medications  Special Instructions: If you have smoked or chewed Tobacco  in the last 2 yrs please stop smoking, stop any regular Alcohol  and or any Recreational drug use.  Wear Seat belts while driving.   Follow-up Information    Follow up with Eulas Post, MD. Schedule an appointment as soon as possible for a visit in 2 weeks.   Contact information:   [address] [place] [address] [phone]       Follow up with [name], MD. Schedule an appointment as soon as possible for a visit in 3 days.   Contact information:   [address] [place] [address] [phone]            Discharge Medications     Medication List     As of 08/22/2012 11:17 AM    START taking these medications         acetaminophen 325 MG tablet   Commonly known as: TYLENOL   Take 2 tablets (650 mg total) by mouth every 6 (six) hours as needed for pain.      bisacodyl 5 MG EC tablet   Commonly known as: DULCOLAX   Take 1 tablet (5 mg total) by mouth daily as needed for constipation.      calcium-vitamin D 500-200 MG-UNIT per tablet   Commonly known as: OSCAL WITH D   Take 1 tablet by mouth daily.      DSS 100 MG Caps   Take 100 mg by mouth 2 (two) times daily.      enoxaparin 40 MG/0.4ML injection   Commonly known as: LOVENOX   Inject 0.4 mLs  (40 mg total) into the skin daily.      HYDROcodone-acetaminophen 5-325 MG per tablet   Commonly known as: NORCO/VICODIN   Take 1-2 tablets by mouth every 6 (six) hours as needed for pain. MAXIMUM TOTAL ACETAMINOPHEN DOSE IS 3000 MG PER DAY      metoprolol tartrate 12.5 mg Tabs   Commonly known as: LOPRESSOR   Take 0.5 tablets (12.5 mg total) by mouth 2 (two) times daily.      polyethylene glycol packet   Commonly known as: MIRALAX / GLYCOLAX   Take 17 g by mouth daily as needed.      sennosides-docusate sodium 8.6-50 MG tablet   Commonly known as: SENOKOT-S   Take 1 tablet by mouth daily.      CONTINUE taking these medications         atorvastatin 40 MG tablet   Commonly known as: LIPITOR      levothyroxine 112 MCG tablet   Commonly known as: SYNTHROID, LEVOTHROID      loratadine 10 MG tablet   Commonly known as: CLARITIN      multivitamin tablet      triamterene-hydrochlorothiazide 50-25 MG per capsule   Commonly known as: DYAZIDE          Where to get your medications    These are the prescriptions that you need to pick up.  We sent them to a specific pharmacy, so you will need to go there to get them.   [facility] - [place], [place] - [address]    [address] [place] [address]    Phone: [phone]        acetaminophen 325 MG tablet   bisacodyl 5 MG EC tablet   calcium-vitamin D 500-200 MG-UNIT per tablet   sennosides-docusate sodium 8.6-50 MG tablet         You may get these medications from any pharmacy.         enoxaparin 40 MG/0.4ML injection   HYDROcodone-acetaminophen 5-325 MG per tablet         Information on where to get these meds is not yet available. Ask your nurse or doctor.         DSS 100 MG Caps   metoprolol tartrate 12.5 mg Tabs   polyethylene glycol packet               Total Time in preparing paper work, data evaluation and todays exam - 35 minutes  Leroy Sea M.D  on 08/22/2012 at 11:17 AM  [facility] Office  [phone]

## 2012-10-03 ENCOUNTER — Other Ambulatory Visit: Payer: Self-pay | Admitting: Family Medicine

## 2012-10-03 DIAGNOSIS — R4701 Aphasia: Secondary | ICD-10-CM

## 2012-10-07 ENCOUNTER — Other Ambulatory Visit: Payer: Medicare Other

## 2012-10-07 ENCOUNTER — Ambulatory Visit
Admission: RE | Admit: 2012-10-07 | Discharge: 2012-10-07 | Disposition: A | Discharge status: Home / Self Care | Payer: Medicare Other | Source: Ambulatory Visit | Attending: Family Medicine | Admitting: Family Medicine

## 2012-10-07 DIAGNOSIS — R4701 Aphasia: Secondary | ICD-10-CM

## 2013-06-16 ENCOUNTER — Ambulatory Visit (HOSPITAL_COMMUNITY)
Admission: RE | Admit: 2013-06-16 | Discharge: 2013-06-16 | Disposition: A | Discharge status: Home / Self Care | Payer: Medicare Other | Source: Ambulatory Visit | Attending: Vascular Surgery | Admitting: Vascular Surgery

## 2013-06-16 ENCOUNTER — Other Ambulatory Visit (HOSPITAL_COMMUNITY): Payer: Self-pay | Admitting: Family Medicine

## 2013-06-16 DIAGNOSIS — R0989 Other specified symptoms and signs involving the circulatory and respiratory systems: Secondary | ICD-10-CM | POA: Insufficient documentation

## 2013-08-14 ENCOUNTER — Other Ambulatory Visit (HOSPITAL_BASED_OUTPATIENT_CLINIC_OR_DEPARTMENT_OTHER): Payer: Medicare Other | Admitting: Lab

## 2013-08-14 ENCOUNTER — Ambulatory Visit (HOSPITAL_COMMUNITY)
Admission: RE | Admit: 2013-08-14 | Discharge: 2013-08-14 | Disposition: A | Discharge status: Home / Self Care | Payer: Medicare Other | Source: Ambulatory Visit | Attending: Internal Medicine | Admitting: Internal Medicine

## 2013-08-14 DIAGNOSIS — I2510 Atherosclerotic heart disease of native coronary artery without angina pectoris: Secondary | ICD-10-CM | POA: Insufficient documentation

## 2013-08-14 DIAGNOSIS — C7A1 Malignant poorly differentiated neuroendocrine tumors: Secondary | ICD-10-CM | POA: Insufficient documentation

## 2013-08-14 DIAGNOSIS — C3430 Malignant neoplasm of lower lobe, unspecified bronchus or lung: Secondary | ICD-10-CM

## 2013-08-14 DIAGNOSIS — M8448XA Pathological fracture, other site, initial encounter for fracture: Secondary | ICD-10-CM | POA: Insufficient documentation

## 2013-08-14 LAB — CBC WITH DIFFERENTIAL/PLATELET
Basophils Absolute: 0 10*3/uL (ref 0.0–0.1)
Eosinophils Absolute: 0.1 10*3/uL (ref 0.0–0.5)
HGB: 15.3 g/dL (ref 13.0–17.1)
LYMPH%: 19 % (ref 14.0–49.0)
MCV: 88.9 fL (ref 79.3–98.0)
MONO%: 7.8 % (ref 0.0–14.0)
NEUT#: 4.8 10*3/uL (ref 1.5–6.5)
Platelets: 204 10*3/uL (ref 140–400)

## 2013-08-14 LAB — COMPREHENSIVE METABOLIC PANEL (CC13)
Albumin: 3.7 g/dL (ref 3.5–5.0)
Alkaline Phosphatase: 82 U/L (ref 40–150)
BUN: 9.2 mg/dL (ref 7.0–26.0)
Glucose: 109 mg/dl (ref 70–140)
Potassium: 3.9 mEq/L (ref 3.5–5.1)
Total Bilirubin: 0.82 mg/dL (ref 0.20–1.20)

## 2013-08-15 ENCOUNTER — Encounter: Payer: Self-pay | Admitting: Physician Assistant

## 2013-08-15 ENCOUNTER — Other Ambulatory Visit (HOSPITAL_COMMUNITY): Payer: Medicare Other

## 2013-08-15 ENCOUNTER — Ambulatory Visit (HOSPITAL_BASED_OUTPATIENT_CLINIC_OR_DEPARTMENT_OTHER): Payer: Medicare Other | Admitting: Physician Assistant

## 2013-08-15 ENCOUNTER — Encounter (INDEPENDENT_AMBULATORY_CARE_PROVIDER_SITE_OTHER): Payer: Self-pay

## 2013-08-15 DIAGNOSIS — J449 Chronic obstructive pulmonary disease, unspecified: Secondary | ICD-10-CM

## 2013-08-15 DIAGNOSIS — C7A1 Malignant poorly differentiated neuroendocrine tumors: Secondary | ICD-10-CM

## 2013-08-15 DIAGNOSIS — I10 Essential (primary) hypertension: Secondary | ICD-10-CM

## 2013-08-15 NOTE — Patient Instructions (Signed)
Followup with Dr. Arbutus Ped in 1 year with a repeat CT scan of your chest to reevaluate her disease

## 2013-08-15 NOTE — Progress Notes (Addendum)
[facility] Telephone:([phone]) [phone]   [phone]) [phone]  SHARED VISIT PROGRESS NOTE  [name], MD [address] [place] [address]  PRINCIPAL DIAGNOSIS: Stage II-B (T2, N1, MX) non-small cell lung cancer, large cell neuroendocrine carcinoma diagnosed in November 2008.   PRIOR THERAPY:  1. Status post left upper lobectomy with lymph node dissection under the care of Dr. Edwyna Shell on October 10, 2007. 2. Status post 4 cycles of adjuvant chemotherapy with carboplatin and docetaxel. Last dose was given Jan 20, 2008.  CURRENT THERAPY: Observation.  INTERVAL HISTORY: [name] 79 y.o. male returns to the clinic today for routine annual followup visit accompanied his wife. The patient is feeling fine today with no specific complaints except for mild fatigue. Of note shortly after he was seen last year or the patient fell and broke his left hip. He's had some difficulty walking and is being followed by his primary care physician for a right footdrop. He ambulates with a cane but is very slow. He denied having any significant weight loss or night sweats. He has no chest pain, shortness breath, cough or hemoptysis. He had repeat CT scan of the chest performed recently and he is here today for evaluation and discussion of his scan results.  MEDICAL HISTORY: Past Medical History  Diagnosis Date  . Hyperlipidemia   . Hypertension   . Allergy   . Fractured shoulder 8/30?    pt fell about 3 weeks ago and fractured shoulder blade   . Fracture, intertrochanteric, left femur 08/17/2012  . COPD (chronic obstructive pulmonary disease)   . colon ca dx'd 1994    chemo  . Lung cancer dx'd 07/2007    lu-lobectomy    ALLERGIES:  is allergic to morphine and related and codeine.  MEDICATIONS:  Current Outpatient Prescriptions  Medication Sig Dispense Refill  . alendronate (FOSAMAX) 70 MG/75ML solution Take 70 mg by mouth every 7 (seven) days. Take with a full  glass of water on an empty stomach.      Marland Kitchen atorvastatin (LIPITOR) 40 MG tablet Take 40 mg by mouth daily.        Marland Kitchen levothyroxine (SYNTHROID, LEVOTHROID) 112 MCG tablet Take 112 mcg by mouth daily.      Marland Kitchen loratadine (CLARITIN) 10 MG tablet Take 10 mg by mouth daily.      . magnesium oxide (MAG-OX) 400 MG tablet Take 400 mg by mouth 2 (two) times daily.      Marland Kitchen triamterene-hydrochlorothiazide (DYAZIDE) 50-25 MG per capsule Take 1 capsule by mouth daily.      Marland Kitchen acetaminophen (TYLENOL) 325 MG tablet Take 2 tablets (650 mg total) by mouth every 6 (six) hours as needed for pain.  60 tablet  1  . bisacodyl (DULCOLAX) 5 MG EC tablet Take 1 tablet (5 mg total) by mouth daily as needed for constipation.  30 tablet  0  . calcium-vitamin D (OSCAL WITH D) 500-200 MG-UNIT per tablet Take 1 tablet by mouth daily.  100 tablet  2  . docusate sodium 100 MG CAPS Take 100 mg by mouth 2 (two) times daily.  10 capsule    . enoxaparin (LOVENOX) 40 MG/0.4ML injection Inject 0.4 mLs (40 mg total) into the skin daily.  21 Syringe  0  . HYDROcodone-acetaminophen (NORCO) 5-325 MG per tablet Take 1-2 tablets by mouth every 6 (six) hours as needed for pain. MAXIMUM TOTAL ACETAMINOPHEN DOSE IS 3000 MG PER DAY  20 tablet  0  .  metoprolol tartrate (LOPRESSOR) 12.5 mg TABS Take 0.5 tablets (12.5 mg total) by mouth 2 (two) times daily.      . Multiple Vitamin (MULTIVITAMIN) tablet Take 1 tablet by mouth daily.      . polyethylene glycol (MIRALAX / GLYCOLAX) packet Take 17 g by mouth daily as needed.  14 each    . sennosides-docusate sodium (SENOKOT-S) 8.6-50 MG tablet Take 1 tablet by mouth daily.  30 tablet  1   No current facility-administered medications for this visit.    SURGICAL HISTORY:  Past Surgical History  Procedure Laterality Date  . Colon surgery    . Colostomy takedown    . Colostomy revision    . Hernia repair    . Femur fracture surgery  08/17/2012  . Intramedullary (im) nail intertrochanteric  08/17/2012     Procedure: INTRAMEDULLARY (IM) NAIL INTERTROCHANTRIC;  Surgeon: Eulas Post, MD;  Location: [facility] OR;  Service: Orthopedics;  Laterality: Left;  Synthes GSN, Fracture Table, Big C arm    REVIEW OF SYSTEMS:  A comprehensive review of systems was negative except for: Constitutional: positive for fatigue   PHYSICAL EXAMINATION: General appearance: alert, cooperative and no distress Head: Normocephalic, without obvious abnormality, atraumatic Neck: no adenopathy Lymph nodes: Cervical, supraclavicular, and axillary nodes normal. Resp: clear to auscultation bilaterally Cardio: regular rate and rhythm, S1, S2 normal, no murmur, click, rub or gallop GI: soft, non-tender; bowel sounds normal; no masses,  no organomegaly Extremities: extremities normal, atraumatic, no cyanosis or edema  ECOG PERFORMANCE STATUS: 1 - Symptomatic but completely ambulatory  Blood pressure 147/74, pulse 95, temperature 97.1 F (36.2 C), temperature source Oral, resp. rate 18, height 6' (1.829 m), weight 210 lb (95.255 kg), SpO2 97.00%.  LABORATORY DATA: Lab Results  Component Value Date   WBC 6.8 08/14/2013   HGB 15.3 08/14/2013   HCT 45.0 08/14/2013   MCV 88.9 08/14/2013   PLT 204 08/14/2013      Chemistry      Component Value Date/Time   NA 142 08/14/2013 0756   NA 138 08/20/2012 0515   K 3.9 08/14/2013 0756   K 3.8 08/20/2012 0515   CL 99 08/20/2012 0515   CL 98 08/11/2012 0807   CO2 34* 08/14/2013 0756   CO2 34* 08/20/2012 0515   BUN 9.2 08/14/2013 0756   BUN 17 08/20/2012 0515   CREATININE 0.8 08/14/2013 0756   CREATININE 0.88 08/20/2012 0515      Component Value Date/Time   CALCIUM 9.9 08/14/2013 0756   CALCIUM 8.7 08/20/2012 0515   ALKPHOS 82 08/14/2013 0756   ALKPHOS 67 08/17/2012 1630   AST 18 08/14/2013 0756   AST 20 08/17/2012 1630   ALT 17 08/14/2013 0756   ALT 14 08/17/2012 1630   BILITOT 0.82 08/14/2013 0756   BILITOT 0.5 08/17/2012 1630       RADIOGRAPHIC STUDIES: Ct Chest Wo  Contrast  08/14/2013   CLINICAL DATA:  Left upper lobectomy for lung cancer.  EXAM: CT CHEST WITHOUT CONTRAST  TECHNIQUE: Multidetector CT imaging of the chest was performed following the standard protocol without IV contrast.  COMPARISON:  08/12/2012  FINDINGS: There is no axillary lymphadenopathy. No mediastinal or hilar lymphadenopathy. Scattered tiny lymph nodes in the mediastinum are stable. No bulky hilar lymphadenopathy. The heart size is normal. Coronary artery calcification is noted. No pericardial or pleural effusion.  No pulmonary nodule or mass. No focal airspace consolidation. No pulmonary edema. There is some chronic atelectasis or scarring in the posterior  left lung base.  Bones are diffusely demineralized. No worrisome lytic or sclerotic osseous abnormality. Multiple nonacute right rib fractures are evident.  IMPRESSION: Stable exam. No new or progressive findings to suggest recurrent disease.   Electronically Signed   By: Kennith Center M.D.   On: 08/14/2013 09:37    ASSESSMENT: This is a very pleasant 79 years old white male with history of stage IIb non-small cell lung cancer status post left upper lobectomy followed by 4 cycles of adjuvant chemotherapy and he has been observation since May of 2009 with no evidence for disease recurrence.  PLAN: Patient was discussed with also seen by Dr. Arbutus Ped. He will continue on observation as his recent CT scan of the chest without contrast revealed no evidence for recurrent or progressive disease. He'll followup in one year with repeat CT scan of the chest without contrast to reevaluate his disease.   He was advised to call immediately if he has any concerning symptoms in the interval.  All questions were answered. The patient knows to call the clinic with any problems, questions or concerns. We can certainly see the patient much sooner if necessary.  Conni Slipper PA-C  ADDENDUM: Hematology/Oncology Attending: I had a face to face  encounter with the patient. I recommended his care plan. The patient is a very pleasant 79 years old white male with history of stage IIB non-small cell lung cancer and has been observation since May of 2009 with no evidence for disease recurrence. I discussed the scan results with the patient and his wife today. I recommended for him to come back for follow up visit in one year with repeat CT scan of the chest. The patient was advised to call immediately if he has any concerning symptoms in the interval. Lajuana Matte., MD 08/15/2013

## 2013-09-04 ENCOUNTER — Telehealth: Payer: Self-pay | Admitting: Internal Medicine

## 2013-09-04 NOTE — Telephone Encounter (Signed)
s.w. pt wife advised on 12.15 appts...Marland Kitchenok and aware

## 2014-06-22 ENCOUNTER — Other Ambulatory Visit (HOSPITAL_COMMUNITY): Payer: Self-pay | Admitting: Family Medicine

## 2014-06-22 ENCOUNTER — Ambulatory Visit (HOSPITAL_COMMUNITY)
Admission: RE | Admit: 2014-06-22 | Discharge: 2014-06-22 | Disposition: A | Discharge status: Home / Self Care | Payer: Medicare Other | Source: Ambulatory Visit | Attending: Vascular Surgery | Admitting: Vascular Surgery

## 2014-06-22 DIAGNOSIS — I6523 Occlusion and stenosis of bilateral carotid arteries: Secondary | ICD-10-CM | POA: Insufficient documentation

## 2014-08-14 ENCOUNTER — Ambulatory Visit (HOSPITAL_COMMUNITY)
Admission: RE | Admit: 2014-08-14 | Discharge: 2014-08-14 | Disposition: A | Discharge status: Home / Self Care | Payer: Medicare Other | Source: Ambulatory Visit | Attending: Physician Assistant | Admitting: Physician Assistant

## 2014-08-14 ENCOUNTER — Other Ambulatory Visit (HOSPITAL_BASED_OUTPATIENT_CLINIC_OR_DEPARTMENT_OTHER): Payer: Medicare Other

## 2014-08-14 DIAGNOSIS — Z85038 Personal history of other malignant neoplasm of large intestine: Secondary | ICD-10-CM | POA: Insufficient documentation

## 2014-08-14 DIAGNOSIS — Z902 Acquired absence of lung [part of]: Secondary | ICD-10-CM | POA: Insufficient documentation

## 2014-08-14 DIAGNOSIS — I2510 Atherosclerotic heart disease of native coronary artery without angina pectoris: Secondary | ICD-10-CM | POA: Diagnosis present

## 2014-08-14 DIAGNOSIS — Z85118 Personal history of other malignant neoplasm of bronchus and lung: Secondary | ICD-10-CM | POA: Insufficient documentation

## 2014-08-14 DIAGNOSIS — R05 Cough: Secondary | ICD-10-CM | POA: Diagnosis not present

## 2014-08-14 DIAGNOSIS — Z85110 Personal history of malignant carcinoid tumor of bronchus and lung: Secondary | ICD-10-CM

## 2014-08-14 LAB — COMPREHENSIVE METABOLIC PANEL (CC13)
ALK PHOS: 75 U/L (ref 40–150)
ALT: 13 U/L (ref 0–55)
AST: 14 U/L (ref 5–34)
Albumin: 3.5 g/dL (ref 3.5–5.0)
Anion Gap: 11 mEq/L (ref 3–11)
BUN: 12.9 mg/dL (ref 7.0–26.0)
CO2: 34 mEq/L — ABNORMAL HIGH (ref 22–29)
CREATININE: 0.8 mg/dL (ref 0.7–1.3)
Calcium: 9.9 mg/dL (ref 8.4–10.4)
Chloride: 99 mEq/L (ref 98–109)
EGFR: 80 mL/min/{1.73_m2} — ABNORMAL LOW (ref >90)
Glucose: 82 mg/dl (ref 70–140)
Potassium: 4.4 mEq/L (ref 3.5–5.1)
Sodium: 144 mEq/L (ref 136–145)
Total Bilirubin: 0.55 mg/dL (ref 0.20–1.20)
Total Protein: 6.8 g/dL (ref 6.4–8.3)

## 2014-08-14 LAB — CBC WITH DIFFERENTIAL/PLATELET
BASO%: 0.2 % (ref 0.0–2.0)
BASOS ABS: 0 10*3/uL (ref 0.0–0.1)
EOS%: 1.9 % (ref 0.0–7.0)
Eosinophils Absolute: 0.2 10*3/uL (ref 0.0–0.5)
HEMATOCRIT: 44.4 % (ref 38.4–49.9)
HEMOGLOBIN: 14.4 g/dL (ref 13.0–17.1)
LYMPH%: 18.3 % (ref 14.0–49.0)
MCH: 29.6 pg (ref 27.2–33.4)
MCHC: 32.4 g/dL (ref 32.0–36.0)
MCV: 91.2 fL (ref 79.3–98.0)
MONO#: 0.8 10*3/uL (ref 0.1–0.9)
MONO%: 8.8 % (ref 0.0–14.0)
NEUT#: 6.2 10*3/uL (ref 1.5–6.5)
NEUT%: 70.8 % (ref 39.0–75.0)
Platelets: 216 10*3/uL (ref 140–400)
RBC: 4.87 10*6/uL (ref 4.20–5.82)
RDW: 13.9 % (ref 11.0–14.6)
WBC: 8.7 10*3/uL (ref 4.0–10.3)
lymph#: 1.6 10*3/uL (ref 0.9–3.3)

## 2014-08-15 ENCOUNTER — Telehealth: Payer: Self-pay | Admitting: Internal Medicine

## 2014-08-15 ENCOUNTER — Ambulatory Visit (HOSPITAL_BASED_OUTPATIENT_CLINIC_OR_DEPARTMENT_OTHER): Payer: Medicare Other | Admitting: Physician Assistant

## 2014-08-15 ENCOUNTER — Encounter: Payer: Self-pay | Admitting: Physician Assistant

## 2014-08-15 VITALS — BP 135/50 | HR 74 | Temp 99.7°F | Resp 17

## 2014-08-15 DIAGNOSIS — C3412 Malignant neoplasm of upper lobe, left bronchus or lung: Secondary | ICD-10-CM

## 2014-08-15 DIAGNOSIS — Z85118 Personal history of other malignant neoplasm of bronchus and lung: Secondary | ICD-10-CM

## 2014-08-15 NOTE — Telephone Encounter (Signed)
gv and printed appt sched and avs for pt for Dec 2016

## 2014-08-15 NOTE — Patient Instructions (Signed)
Your recent CT scan showed no evidence for disease recurrence or progression Follow-up in 1 year with a repeat CT scan of your chest to reevaluate your disease He may take over-the-counter cough and cold preparations for symptom management however if your symptoms persist or worsen follow-up with your primary care physician for further evaluation

## 2014-08-15 NOTE — Progress Notes (Addendum)
[facility] [phone]   Fax:(336) [phone]  SHARED VISIT PROGRESS NOTE  Marylene Land, MD [place] [address]  PRINCIPAL DIAGNOSIS: Stage II-B (T2, N1, MX) non-small cell lung cancer, large cell neuroendocrine carcinoma diagnosed in November 2008.   PRIOR THERAPY:  1. Status post left upper lobectomy with lymph node dissection under the care of Dr. Arlyce Dice on October 10, 2007. 2. Status post 4 cycles of adjuvant chemotherapy with carboplatin and docetaxel. Last dose was given Jan 20, 2008.  CURRENT THERAPY: Observation.  INTERVAL HISTORY: [name] 80 y.o. male returns to the clinic today for routine annual followup visit accompanied his wife. The patient is feeling fine today with no specific complaints except for mild fatigue and recent upper respiratory symptoms. He is had a cough that is only occasionally productive of clear secretions, not associated with any fever or chills. He is otherwise feeling fine.  He denied having any significant weight loss or night sweats. He has no chest pain, shortness breath, or hemoptysis. He had a restaging of the chest performed recently and he is here today for evaluation and discussion of his scan results.  MEDICAL HISTORY: Past Medical History  Diagnosis Date  . Hyperlipidemia   . Hypertension   . Allergy   . Fractured shoulder 8/30?    pt fell about 3 weeks ago and fractured shoulder blade   . Fracture, intertrochanteric, left femur 08/17/2012  . COPD (chronic obstructive pulmonary disease)   . colon ca dx'd 1994    chemo  . Lung cancer dx'd 07/2007    lu-lobectomy    ALLERGIES:  is allergic to morphine and related and codeine.  MEDICATIONS:  Current Outpatient Prescriptions  Medication Sig Dispense Refill  . atorvastatin (LIPITOR) 40 MG tablet Take 40 mg by mouth daily.      Marland Kitchen levothyroxine (SYNTHROID, LEVOTHROID) 112 MCG tablet Take 112 mcg by mouth daily.    .  magnesium oxide (MAG-OX) 400 MG tablet Take 400 mg by mouth 2 (two) times daily.    . metoprolol tartrate (LOPRESSOR) 12.5 mg TABS Take 0.5 tablets (12.5 mg total) by mouth 2 (two) times daily.    Marland Kitchen triamterene-hydrochlorothiazide (MAXZIDE-25) 37.5-25 MG per tablet   1  . acetaminophen (TYLENOL) 325 MG tablet Take 2 tablets (650 mg total) by mouth every 6 (six) hours as needed for pain. (Patient not taking: Reported on 08/15/2014) 60 tablet 1  . calcium-vitamin D (OSCAL WITH D) 500-200 MG-UNIT per tablet Take 1 tablet by mouth daily. (Patient not taking: Reported on 08/15/2014) 100 tablet 2  . docusate sodium 100 MG CAPS Take 100 mg by mouth 2 (two) times daily. (Patient not taking: Reported on 08/15/2014) 10 capsule   . loratadine (CLARITIN) 10 MG tablet Take 10 mg by mouth daily.    . Multiple Vitamin (MULTIVITAMIN) tablet Take 1 tablet by mouth daily.    . sennosides-docusate sodium (SENOKOT-S) 8.6-50 MG tablet Take 1 tablet by mouth daily. (Patient not taking: Reported on 08/15/2014) 30 tablet 1   No current facility-administered medications for this visit.    SURGICAL HISTORY:  Past Surgical History  Procedure Laterality Date  . Colon surgery    . Colostomy takedown    . Colostomy revision    . Hernia repair    . Femur fracture surgery  08/17/2012  . Intramedullary (im) nail intertrochanteric  08/17/2012    Procedure: INTRAMEDULLARY (IM) NAIL INTERTROCHANTRIC;  Surgeon: Johnny Bridge,  MD;  Location: [facility];  Service: Orthopedics;  Laterality: Left;  Synthes GSN, Fracture Table, Big C arm    REVIEW OF SYSTEMS:  A comprehensive review of systems was negative except for: Constitutional: positive for fatigue Respiratory: positive for cough   PHYSICAL EXAMINATION: General appearance: alert, cooperative and no distress Head: Normocephalic, without obvious abnormality, atraumatic Neck: no adenopathy Lymph nodes: Cervical, supraclavicular, and axillary nodes normal. Resp: clear to  auscultation bilaterally Cardio: regular rate and rhythm, S1, S2 normal, no murmur, click, rub or gallop GI: soft, non-tender; bowel sounds normal; no masses,  no organomegaly Extremities: extremities normal, atraumatic, no cyanosis or edema  ECOG PERFORMANCE STATUS: 1 - Symptomatic but completely ambulatory  Blood pressure 135/50, pulse 74, temperature 99.7 F (37.6 C), temperature source Oral, resp. rate 17.  LABORATORY DATA: Lab Results  Component Value Date   WBC 8.7 08/14/2014   HGB 14.4 08/14/2014   HCT 44.4 08/14/2014   MCV 91.2 08/14/2014   PLT 216 08/14/2014      Chemistry      Component Value Date/Time   NA 144 08/14/2014 1121   NA 138 08/20/2012 0515   K 4.4 08/14/2014 1121   K 3.8 08/20/2012 0515   CL 99 08/20/2012 0515   CL 98 08/11/2012 0807   CO2 34* 08/14/2014 1121   CO2 34* 08/20/2012 0515   BUN 12.9 08/14/2014 1121   BUN 17 08/20/2012 0515   CREATININE 0.8 08/14/2014 1121   CREATININE 0.88 08/20/2012 0515      Component Value Date/Time   CALCIUM 9.9 08/14/2014 1121   CALCIUM 8.7 08/20/2012 0515   ALKPHOS 75 08/14/2014 1121   ALKPHOS 67 08/17/2012 1630   AST 14 08/14/2014 1121   AST 20 08/17/2012 1630   ALT 13 08/14/2014 1121   ALT 14 08/17/2012 1630   BILITOT 0.55 08/14/2014 1121   BILITOT 0.5 08/17/2012 1630       RADIOGRAPHIC STUDIES: Ct Chest Wo Contrast  08/14/2014   CLINICAL DATA:  80 year old male with history of lung cancer diagnosed in 2008 and colon cancer in 1994 status post left upper lobectomy and colonic resection presenting with recent history of cough.  EXAM: CT CHEST WITHOUT CONTRAST  TECHNIQUE: Multidetector CT imaging of the chest was performed following the standard protocol without IV contrast.  COMPARISON:  CT of the chest 08/14/2013.  FINDINGS: Mediastinum: Heart size is normal. There is no significant pericardial fluid, thickening or pericardial calcification. There is atherosclerosis of the thoracic aorta, the great  vessels of the mediastinum and the coronary arteries, including calcified atherosclerotic plaque in the left main, left anterior descending, left circumflex and right coronary arteries. Severe calcifications of the mitral annulus. No pathologically enlarged mediastinal or hilar lymph nodes. Please note that accurate exclusion of hilar adenopathy is limited on noncontrast CT scans. Esophagus is unremarkable in appearance.  Lungs/Pleura: Status post left lower lobectomy. Compensatory hyperexpansion of the left upper lobe. Mild chronic scarring in the base of the left upper lobe. No suspicious appearing pulmonary nodules or masses. Some pleural calcifications in the base of the right hemithorax are incidentally noted and unchanged. No acute consolidative airspace disease. No pleural effusions.  Upper Abdomen: Atherosclerosis.  Otherwise, unremarkable.  Musculoskeletal: There are no aggressive appearing lytic or blastic lesions noted in the visualized portions of the skeleton. Multiple old healed lateral right-sided rib fractures incidentally noted.  IMPRESSION: 1. No findings to suggest locally recurrent disease or metastatic disease to the thorax. 2. Stable postoperative changes, as  above. 3. Atherosclerosis, including left main and 3 vessel coronary artery disease. 4. Additional incidental findings, as above.   Electronically Signed   By: Vinnie Langton M.D.   On: 08/14/2014 15:50   ASSESSMENT: This is a very pleasant 80 years old white male with history of stage IIb non-small cell lung cancer status post left upper lobectomy followed by 4 cycles of adjuvant chemotherapy and he has been observation since May of 2009 with no evidence for disease recurrence.  PLAN: Patient was discussed with also seen by Dr. Julien Nordmann. He will continue on observation as his recent CT scan of the chest without contrast revealed no evidence for recurrent or progressive disease. He'll followup in one year with repeat CT scan of the  chest without contrast to reevaluate his disease. For his upper respiratory symptoms, he may use over-the-counter product such as Mucinex and Delsym for symptom management. Should these be ineffective he is to follow-up with his primary care physician.  He was advised to call immediately if he has any concerning symptoms in the interval.  All questions were answered. The patient knows to call the clinic with any problems, questions or concerns. We can certainly see the patient much sooner if necessary.  Carlton Adam, PA-C 08/15/2014  ADDENDUM: Hematology/Oncology Attending: I had a face to face encounter with the patient today. I recommended his care plan. This is a very pleasant 80 years old white male with history of stage IIb non-small cell lung cancer status post left upper lobectomy with lymph node dissection followed by 4 cycles of adjuvant chemotherapy and has been observation for the last 7 years with no evidence for disease recurrence. I discussed the scan results with the patient today and recommended for him to continue on observation with repeat CT scan of the chest in one year. He was advised to call immediately if he has any concerning symptoms in the interval.  Disclaimer: This note was dictated with voice recognition software. Similar sounding words can inadvertently be transcribed and may be missed upon review. Eilleen Kempf., MD 08/15/2014

## 2014-09-02 ENCOUNTER — Encounter (HOSPITAL_COMMUNITY): Payer: Self-pay | Admitting: Emergency Medicine

## 2014-09-02 ENCOUNTER — Inpatient Hospital Stay (HOSPITAL_COMMUNITY): Payer: Medicare Other

## 2014-09-02 ENCOUNTER — Inpatient Hospital Stay (HOSPITAL_COMMUNITY)
Admission: EM | Admit: 2014-09-02 | Discharge: 2014-09-04 | DRG: 536 | Disposition: A | Discharge status: Skilled Nursing Facility | Payer: Medicare Other | Attending: Internal Medicine | Admitting: Internal Medicine

## 2014-09-02 ENCOUNTER — Emergency Department (HOSPITAL_COMMUNITY): Payer: Medicare Other

## 2014-09-02 DIAGNOSIS — R600 Localized edema: Secondary | ICD-10-CM | POA: Diagnosis present

## 2014-09-02 DIAGNOSIS — R0902 Hypoxemia: Secondary | ICD-10-CM

## 2014-09-02 DIAGNOSIS — I10 Essential (primary) hypertension: Secondary | ICD-10-CM

## 2014-09-02 DIAGNOSIS — Z85118 Personal history of other malignant neoplasm of bronchus and lung: Secondary | ICD-10-CM | POA: Diagnosis not present

## 2014-09-02 DIAGNOSIS — Z85038 Personal history of other malignant neoplasm of large intestine: Secondary | ICD-10-CM | POA: Diagnosis not present

## 2014-09-02 DIAGNOSIS — E039 Hypothyroidism, unspecified: Secondary | ICD-10-CM | POA: Diagnosis not present

## 2014-09-02 DIAGNOSIS — M25551 Pain in right hip: Secondary | ICD-10-CM | POA: Diagnosis present

## 2014-09-02 DIAGNOSIS — Z79899 Other long term (current) drug therapy: Secondary | ICD-10-CM | POA: Diagnosis not present

## 2014-09-02 DIAGNOSIS — Z902 Acquired absence of lung [part of]: Secondary | ICD-10-CM | POA: Diagnosis present

## 2014-09-02 DIAGNOSIS — S0990XA Unspecified injury of head, initial encounter: Secondary | ICD-10-CM | POA: Diagnosis not present

## 2014-09-02 DIAGNOSIS — M79609 Pain in unspecified limb: Secondary | ICD-10-CM

## 2014-09-02 DIAGNOSIS — S32591A Other specified fracture of right pubis, initial encounter for closed fracture: Principal | ICD-10-CM

## 2014-09-02 DIAGNOSIS — S32599A Other specified fracture of unspecified pubis, initial encounter for closed fracture: Secondary | ICD-10-CM | POA: Diagnosis present

## 2014-09-02 DIAGNOSIS — R52 Pain, unspecified: Secondary | ICD-10-CM

## 2014-09-02 DIAGNOSIS — F0390 Unspecified dementia without behavioral disturbance: Secondary | ICD-10-CM | POA: Diagnosis not present

## 2014-09-02 DIAGNOSIS — E785 Hyperlipidemia, unspecified: Secondary | ICD-10-CM | POA: Diagnosis present

## 2014-09-02 DIAGNOSIS — W1839XA Other fall on same level, initial encounter: Secondary | ICD-10-CM | POA: Diagnosis not present

## 2014-09-02 DIAGNOSIS — R609 Edema, unspecified: Secondary | ICD-10-CM

## 2014-09-02 DIAGNOSIS — Y9289 Other specified places as the place of occurrence of the external cause: Secondary | ICD-10-CM

## 2014-09-02 DIAGNOSIS — S32599K Other specified fracture of unspecified pubis, subsequent encounter for fracture with nonunion: Secondary | ICD-10-CM | POA: Diagnosis present

## 2014-09-02 DIAGNOSIS — S32501A Unspecified fracture of right pubis, initial encounter for closed fracture: Secondary | ICD-10-CM

## 2014-09-02 DIAGNOSIS — E038 Other specified hypothyroidism: Secondary | ICD-10-CM

## 2014-09-02 LAB — CBC
HEMATOCRIT: 41.5 % (ref 39.0–52.0)
Hemoglobin: 13.8 g/dL (ref 13.0–17.0)
MCH: 29.8 pg (ref 26.0–34.0)
MCHC: 33.3 g/dL (ref 30.0–36.0)
MCV: 89.6 fL (ref 78.0–100.0)
Platelets: 198 10*3/uL (ref 150–400)
RBC: 4.63 MIL/uL (ref 4.22–5.81)
RDW: 14.2 % (ref 11.5–15.5)
WBC: 11.1 10*3/uL — ABNORMAL HIGH (ref 4.0–10.5)

## 2014-09-02 LAB — BASIC METABOLIC PANEL
Anion gap: 8 (ref 5–15)
BUN: 14 mg/dL (ref 6–23)
CALCIUM: 9 mg/dL (ref 8.4–10.5)
CHLORIDE: 95 meq/L — AB (ref 96–112)
CO2: 32 mmol/L (ref 19–32)
CREATININE: 0.83 mg/dL (ref 0.50–1.35)
GFR calc Af Amer: >90 mL/min (ref >90)
GFR calc non Af Amer: 79 mL/min — ABNORMAL LOW (ref >90)
GLUCOSE: 122 mg/dL — AB (ref 70–99)
Potassium: 3.6 mmol/L (ref 3.5–5.1)
Sodium: 135 mmol/L (ref 135–145)

## 2014-09-02 MED ORDER — PANTOPRAZOLE SODIUM 40 MG PO TBEC
40.0000 mg | DELAYED_RELEASE_TABLET | Freq: Every day | ORAL | Status: DC
  Administered 2014-09-03 – 2014-09-04 (×2): 40 mg via ORAL
  Filled 2014-09-02 (×2): qty 1

## 2014-09-02 MED ORDER — ENOXAPARIN SODIUM 40 MG/0.4ML ~~LOC~~ SOLN
40.0000 mg | SUBCUTANEOUS | Status: DC
  Administered 2014-09-02 – 2014-09-03 (×2): 40 mg via SUBCUTANEOUS
  Filled 2014-09-02 (×3): qty 0.4

## 2014-09-02 MED ORDER — ACETAMINOPHEN 650 MG RE SUPP: 650.0000 mg | Freq: Four times a day (QID) | RECTAL | Status: DC | PRN

## 2014-09-02 MED ORDER — MAGNESIUM OXIDE 400 (241.3 MG) MG PO TABS
400.0000 mg | ORAL_TABLET | Freq: Two times a day (BID) | ORAL | Status: DC
  Administered 2014-09-02 – 2014-09-04 (×4): 400 mg via ORAL
  Filled 2014-09-02 (×5): qty 1

## 2014-09-02 MED ORDER — ONDANSETRON HCL 4 MG/2ML IJ SOLN: 4.0000 mg | Freq: Four times a day (QID) | INTRAMUSCULAR | Status: DC | PRN

## 2014-09-02 MED ORDER — SODIUM CHLORIDE 0.9 % IV SOLN
INTRAVENOUS | Status: DC
Start: 2014-09-02 — End: 2014-09-03
  Administered 2014-09-02: 21:00:00 via INTRAVENOUS

## 2014-09-02 MED ORDER — DOCUSATE SODIUM 100 MG PO CAPS
100.0000 mg | ORAL_CAPSULE | Freq: Two times a day (BID) | ORAL | Status: DC
  Administered 2014-09-02 – 2014-09-04 (×2): 100 mg via ORAL
  Filled 2014-09-02 (×5): qty 1

## 2014-09-02 MED ORDER — VITAMIN D3 25 MCG (1000 UNIT) PO TABS
1000.0000 [IU] | ORAL_TABLET | Freq: Every day | ORAL | Status: DC
  Administered 2014-09-03 – 2014-09-04 (×2): 1000 [IU] via ORAL
  Filled 2014-09-02 (×2): qty 1

## 2014-09-02 MED ORDER — ACETAMINOPHEN 500 MG PO TABS
1000.0000 mg | ORAL_TABLET | Freq: Once | ORAL | Status: AC
Start: 2014-09-02 — End: 2014-09-02
  Administered 2014-09-02: 1000 mg via ORAL
  Filled 2014-09-02: qty 2

## 2014-09-02 MED ORDER — ACETAMINOPHEN 325 MG PO TABS
650.0000 mg | ORAL_TABLET | Freq: Four times a day (QID) | ORAL | Status: DC | PRN
  Administered 2014-09-04: 650 mg via ORAL
  Filled 2014-09-02: qty 2

## 2014-09-02 MED ORDER — ONDANSETRON HCL 4 MG PO TABS: 4.0000 mg | ORAL_TABLET | Freq: Four times a day (QID) | ORAL | Status: DC | PRN

## 2014-09-02 MED ORDER — ATORVASTATIN CALCIUM 40 MG PO TABS
40.0000 mg | ORAL_TABLET | Freq: Every day | ORAL | Status: DC
  Administered 2014-09-02 – 2014-09-04 (×3): 40 mg via ORAL
  Filled 2014-09-02 (×3): qty 1

## 2014-09-02 MED ORDER — LEVOTHYROXINE SODIUM 112 MCG PO TABS
112.0000 ug | ORAL_TABLET | Freq: Every day | ORAL | Status: DC
  Administered 2014-09-03 – 2014-09-04 (×2): 112 ug via ORAL
  Filled 2014-09-02 (×3): qty 1

## 2014-09-02 MED ORDER — SENNOSIDES-DOCUSATE SODIUM 8.6-50 MG PO TABS
1.0000 | ORAL_TABLET | Freq: Every day | ORAL | Status: DC
  Administered 2014-09-04: 1 via ORAL
  Filled 2014-09-02 (×2): qty 1

## 2014-09-02 MED ORDER — METOPROLOL SUCCINATE ER 25 MG PO TB24
25.0000 mg | ORAL_TABLET | Freq: Every day | ORAL | Status: DC
  Administered 2014-09-03 – 2014-09-04 (×2): 25 mg via ORAL
  Filled 2014-09-02 (×2): qty 1

## 2014-09-02 NOTE — ED Notes (Addendum)
Pt from home via EMS-Per EMS pt fell last pm w/o injury and was able to get up  by himself. Pt woke up this am with R leg weakness and R hip pain.Family reports that he did not hit head or have LOC, not on blood thinners. Pt has hx of dementia and reports no pain but responds to pain when R hip is touched. Pt is at baseline per family. Pt in NAD

## 2014-09-02 NOTE — Progress Notes (Signed)
Pt incontinent of stool with blood noted in it. Wife at bedside

## 2014-09-02 NOTE — ED Notes (Signed)
Pt unable to report what happened d/t dementia. Pt denies pain. Pt has expiratory wheezing bilaterally, in NAD.

## 2014-09-02 NOTE — ED Provider Notes (Signed)
CSN: [id number]     Arrival date & time 09/02/14  0912 History   First MD Initiated Contact with Patient 09/02/14 [id number]     Chief Complaint  Patient presents with  . Hip Pain     HPI Family reports the patient had a fall yesterday while walking with his wife.  He injured his right hip.  Been unable to bear weight on that right hip since the fall.  Is also reported that his head.  No loss consciousness.  Patient has baseline memory issues.  Family reports baseline mental status at this time.  Patient denies neck pain.  No chest pain or shortness of breath.  No abdominal pain.   Past Medical History  Diagnosis Date  . Hyperlipidemia   . Hypertension   . Allergy   . Fractured shoulder 8/30?    pt fell about 3 weeks ago and fractured shoulder blade   . Fracture, intertrochanteric, left femur 08/17/2012  . COPD (chronic obstructive pulmonary disease)   . colon ca dx'd 1994    chemo  . Lung cancer dx'd 07/2007    lu-lobectomy   Past Surgical History  Procedure Laterality Date  . Colon surgery    . Colostomy takedown    . Colostomy revision    . Hernia repair    . Femur fracture surgery  08/17/2012  . Intramedullary (im) nail intertrochanteric  08/17/2012    Procedure: INTRAMEDULLARY (IM) NAIL INTERTROCHANTRIC;  Surgeon: Johnny Bridge, MD;  Location: [place];  Service: Orthopedics;  Laterality: Left;  Synthes GSN, Fracture Table, Big C arm   Family History  Problem Relation Age of Onset  . Stroke Mother   . Heart disease Brother    History  Substance Use Topics  . Smoking status: Never Smoker   . Smokeless tobacco: Never Used  . Alcohol Use: No    Review of Systems  All other systems reviewed and are negative.     Allergies  Morphine and related and Codeine  Home Medications   Prior to Admission medications   Medication Sig Start Date End Date Taking? Authorizing Provider  acetaminophen (TYLENOL) 325 MG tablet Take 2 tablets (650 mg total) by mouth every 6 (six)  hours as needed for pain. Patient taking differently: Take 325-650 mg by mouth every 6 (six) hours as needed for pain.  08/17/12  Yes Johnny Bridge, MD  atorvastatin (LIPITOR) 40 MG tablet Take 40 mg by mouth daily.     Yes Historical Provider, MD  cholecalciferol (VITAMIN D) 1000 UNITS tablet Take 1,000 Units by mouth daily.   Yes Historical Provider, MD  hydrocortisone 2.5 % cream Apply 1 application topically 2 (two) times daily as needed (rash).  08/04/14  Yes Historical Provider, MD  levothyroxine (SYNTHROID, LEVOTHROID) 112 MCG tablet Take 112 mcg by mouth daily.   Yes Historical Provider, MD  magnesium oxide (MAG-OX) 400 MG tablet Take 400 mg by mouth 2 (two) times daily.   Yes Historical Provider, MD  metoprolol succinate (TOPROL-XL) 25 MG 24 hr tablet Take 25 mg by mouth daily.  08/16/14  Yes Historical Provider, MD  pantoprazole (PROTONIX) 40 MG tablet Take 40 mg by mouth daily.  08/23/14  Yes Historical Provider, MD  triamterene-hydrochlorothiazide (MAXZIDE-25) 37.5-25 MG per tablet Take 1 tablet by mouth daily.  08/04/14  Yes Historical Provider, MD  calcium-vitamin D (OSCAL WITH D) 500-200 MG-UNIT per tablet Take 1 tablet by mouth daily. Patient not taking: Reported on 09/02/2014 08/17/12   Vonna Kotyk  Llana Aliment, MD  docusate sodium 100 MG CAPS Take 100 mg by mouth 2 (two) times daily. Patient not taking: Reported on 08/15/2014 08/22/12   Thurnell Lose, MD  metoprolol tartrate (LOPRESSOR) 12.5 mg TABS Take 0.5 tablets (12.5 mg total) by mouth 2 (two) times daily. Patient not taking: Reported on 09/02/2014 08/22/12   Thurnell Lose, MD  sennosides-docusate sodium (SENOKOT-S) 8.6-50 MG tablet Take 1 tablet by mouth daily. Patient not taking: Reported on 08/15/2014 08/17/12   Johnny Bridge, MD   BP 131/77 mmHg  Pulse 82  Temp(Src) 98.3 F (36.8 C) (Oral)  Resp 12  SpO2 92% Physical Exam  Constitutional: He is oriented to person, place, and time. He appears well-developed and  well-nourished.  HENT:  Head: Normocephalic and atraumatic.  Eyes: EOM are normal.  Neck: Normal range of motion. Neck supple.  No C-spine tenderness  Cardiovascular: Normal rate, regular rhythm, normal heart sounds and intact distal pulses.   Pulmonary/Chest: Effort normal and breath sounds normal. No respiratory distress.  Abdominal: Soft. He exhibits no distension.  Musculoskeletal:  No significant range of motion issues with bilateral knees her bilateral hips.  Neurological: He is alert and oriented to person, place, and time.  Skin: Skin is warm and dry.  Psychiatric: He has a normal mood and affect. Judgment normal.  Nursing note and vitals reviewed.   ED Course  Procedures (including critical care time) Labs Review Labs Reviewed  CBC - Abnormal; Notable for the following:    WBC 11.1 (*)    All other components within normal limits  BASIC METABOLIC PANEL - Abnormal; Notable for the following:    Chloride 95 (*)    Glucose, Bld 122 (*)    GFR calc non Af Amer 79 (*)    All other components within normal limits    Imaging Review Dg Hip Complete Right  09/02/2014   CLINICAL DATA:  [place] last night injuring RIGHT hip, RIGHT hip pain  EXAM: RIGHT HIP - COMPLETE 2+ VIEW  COMPARISON:  Pelvic radiograph 08/17/2012  FINDINGS: Osseous demineralization.  Hip and SI joints symmetric.  Prior ORIF proximal LEFT femur.  Nondisplaced fractures of the RIGHT superior and inferior pubic rami.  Femoral head normally located without fracture or dislocation.  Scattered atherosclerotic calcifications.  IMPRESSION: Osseous demineralization.  Nondisplaced RIGHT superior and inferior pubic rami fractures.  No proximal RIGHT femoral fracture identified.  Prior ORIF proximal LEFT femur.   Electronically Signed   By: Lavonia Dana M.D.   On: 09/02/2014 10:53   Ct Head Wo Contrast  09/02/2014   CLINICAL DATA:  Fall 1 day ago.  EXAM: CT HEAD WITHOUT CONTRAST  TECHNIQUE: Contiguous axial images were  obtained from the base of the skull through the vertex without intravenous contrast.  COMPARISON:  08/17/2012  FINDINGS: Global atrophy. Chronic ischemic changes. No mass effect, midline shift, or hemorrhage. Mastoid air cells clear. Visualized paranasal sinuses clear.  IMPRESSION: No acute intracranial pathology.   Electronically Signed   By: Maryclare Bean M.D.   On: 09/02/2014 10:44  I personally reviewed the imaging tests through PACS system I reviewed available ER/hospitalization records through the EMR    EKG Interpretation None      MDM   Final diagnoses:  Head injury  Pain  Fracture of multiple pubic rami, right, closed, initial encounter    Right pubic rami fracture. Admit to hospital. Will obtain CT through right hip. Ortho consult. Can not bear weight on right lower  extremity secondary to pain. Mechanical fall  Ortho: Dr Ronnie Derby [facility]- Dr Tat  Hoy Morn, MD 09/02/14 [phone]

## 2014-09-02 NOTE — ED Notes (Signed)
Hospitalist MD at bedside. 

## 2014-09-02 NOTE — Progress Notes (Signed)
VASCULAR LAB PRELIMINARY  PRELIMINARY  PRELIMINARY  PRELIMINARY  Right lower extremity venous Doppler completed.    Preliminary report:  There is no DVT or SVT noted in the right lower extremity.    Aniceto Kyser, RVT 09/02/2014, 5:35 PM

## 2014-09-02 NOTE — ED Notes (Signed)
Pt was unable to ambulate with tech assistance x2. Dr Venora Maples notified

## 2014-09-02 NOTE — H&P (Signed)
History and Physical  RECO SHONK GLO:[id number] DOB: [birth year] DOA: 09/02/2014   PCP: Marylene Land, MD   Chief Complaint: right hip pain  HPI:  80 y/o male with history of HTN, HLD, Dementia, NSCLC (Dr. Julien Nordmann) presented after a mechanical fall on 09/01/2014. The patient was getting around with his usual walker and lost his balance resulting in a mechanical fall. There was no loss of consciousness. The patient has been in his usual state of health without any complaints of fevers, chills, chest pain, shortness breath, coughing, hemoptysis, nausea, vomiting, diarrhea. He normally has loose stools. There has been no hematochezia or melena. He has not been started on any new medications except for a newly started PPI  And some medicine for essential tremor (family cannot recall name). The patient complained of right leg pain and could not bear weight on his right leg. As result, the patient was brought to the emergency department for further evaluation. The patient was afebrile and hemodynamically stable. X-ray of the right hip revealed a nondisplaced right superior inferior pubic rami fracture. CT of the brain was unremarkable. BMP and CBC were essentially unremarkable with WBC count 11.1, hemoglobin 13.8. Assessment/Plan: Right pubic rami fracture -EDP has consulted Dr. Rhea Pink -pain control -PT/OT R-leg edema -venous duplex, r/ DVT Hypertension -Continue metoprolol succinate -Hold triamterene HCTZ Hypothyroidism -Continue Synthroid Hyperlipidemia -Continue statin NSCLC -placed on Dr. Worthy Flank list -not actively receiving chemo -s/p LUL lobectomey       Past Medical History  Diagnosis Date  . Hyperlipidemia   . Hypertension   . Allergy   . Fractured shoulder 8/30?    pt fell about 3 weeks ago and fractured shoulder blade   . Fracture, intertrochanteric, left femur 08/17/2012  . COPD (chronic obstructive pulmonary disease)   . colon ca dx'd 1994    chemo  . Lung cancer dx'd 07/2007    lu-lobectomy   Past Surgical History  Procedure Laterality Date  . Colon surgery    . Colostomy takedown    . Colostomy revision    . Hernia repair    . Femur fracture surgery  08/17/2012  . Intramedullary (im) nail intertrochanteric  08/17/2012    Procedure: INTRAMEDULLARY (IM) NAIL INTERTROCHANTRIC;  Surgeon: Johnny Bridge, MD;  Location: [place];  Service: Orthopedics;  Laterality: Left;  Synthes GSN, Fracture Table, Big C arm   Social History:  reports that he has never smoked. He has never used smokeless tobacco. He reports that he does not drink alcohol or use illicit drugs.   Family History  Problem Relation Age of Onset  . Stroke Mother   . Heart disease Brother      Allergies  Allergen Reactions  . Morphine And Related Other (See Comments)    hallucinations  . Codeine     Pt is allergic to pain medications including morphine       Prior to Admission medications   Medication Sig Start Date End Date Taking? Authorizing Provider  acetaminophen (TYLENOL) 325 MG tablet Take 2 tablets (650 mg total) by mouth every 6 (six) hours as needed for pain. Patient taking differently: Take 325-650 mg by mouth every 6 (six) hours as needed for pain.  08/17/12  Yes Johnny Bridge, MD  atorvastatin (LIPITOR) 40 MG tablet Take 40 mg by mouth daily.     Yes Historical Provider, MD  cholecalciferol (VITAMIN D) 1000 UNITS tablet Take 1,000 Units by mouth daily.   Yes Historical Provider, MD  hydrocortisone 2.5 % cream Apply 1 application topically 2 (two) times daily as needed (rash).  08/04/14  Yes Historical Provider, MD  levothyroxine (SYNTHROID, LEVOTHROID) 112 MCG tablet Take 112 mcg by mouth daily.   Yes Historical Provider, MD  magnesium oxide (MAG-OX) 400 MG tablet Take 400 mg by mouth 2 (two) times daily.   Yes Historical Provider, MD  metoprolol succinate (TOPROL-XL) 25 MG 24 hr tablet Take 25 mg by mouth daily.  08/16/14  Yes Historical  Provider, MD  pantoprazole (PROTONIX) 40 MG tablet Take 40 mg by mouth daily.  08/23/14  Yes Historical Provider, MD  triamterene-hydrochlorothiazide (MAXZIDE-25) 37.5-25 MG per tablet Take 1 tablet by mouth daily.  08/04/14  Yes Historical Provider, MD  calcium-vitamin D (OSCAL WITH D) 500-200 MG-UNIT per tablet Take 1 tablet by mouth daily. Patient not taking: Reported on 09/02/2014 08/17/12   Johnny Bridge, MD  docusate sodium 100 MG CAPS Take 100 mg by mouth 2 (two) times daily. Patient not taking: Reported on 08/15/2014 08/22/12   Thurnell Lose, MD  metoprolol tartrate (LOPRESSOR) 12.5 mg TABS Take 0.5 tablets (12.5 mg total) by mouth 2 (two) times daily. Patient not taking: Reported on 09/02/2014 08/22/12   Thurnell Lose, MD  sennosides-docusate sodium (SENOKOT-S) 8.6-50 MG tablet Take 1 tablet by mouth daily. Patient not taking: Reported on 08/15/2014 08/17/12   Johnny Bridge, MD    Review of Systems:  Unobtainable secondary to patient's mental status  Physical Exam: Filed Vitals:   09/02/14 0913 09/02/14 0919 09/02/14 0924  BP:  135/72   Pulse:  85   Temp:  98 F (36.7 C)   TempSrc:  Oral   Resp:  12   SpO2: 96% 89% 96%   General:  Awake and alert, pleasantly confused, NAD, nontoxic, pleasant/cooperative Head/Eye: No conjunctival hemorrhage, no icterus, [place]/AT, No nystagmus ENT:  No icterus,  No thrush,, no pharyngeal exudate Neck:  No masses, no lymphadenpathy, no bruits CV:  RRR, no rub, no gallop, no S3 Lung:  CTAB, good air movement, no wheeze, no rhonchi Abdomen: soft/NT, +BS, nondistended, no peritoneal signs Ext: No cyanosis, No rashes, No petechiae, No lymphangitis, 1+RLE edema Neur  Labs on Admission:  Basic Metabolic Panel:  Recent Labs Lab 09/02/14 1003  NA 135  K 3.6  CL 95*  CO2 32  GLUCOSE 122*  BUN 14  CREATININE 0.83  CALCIUM 9.0   Liver Function Tests: No results for input(s): AST, ALT, ALKPHOS, BILITOT, PROT, ALBUMIN in the last  168 hours. No results for input(s): LIPASE, AMYLASE in the last 168 hours. No results for input(s): AMMONIA in the last 168 hours. CBC:  Recent Labs Lab 09/02/14 1003  WBC 11.1*  HGB 13.8  HCT 41.5  MCV 89.6  PLT 198   Cardiac Enzymes: No results for input(s): CKTOTAL, CKMB, CKMBINDEX, TROPONINI in the last 168 hours. BNP: Invalid input(s): POCBNP CBG: No results for input(s): GLUCAP in the last 168 hours.  Radiological Exams on Admission: Dg Hip Complete Right  09/02/2014   CLINICAL DATA:  [place] last night injuring RIGHT hip, RIGHT hip pain  EXAM: RIGHT HIP - COMPLETE 2+ VIEW  COMPARISON:  Pelvic radiograph 08/17/2012  FINDINGS: Osseous demineralization.  Hip and SI joints symmetric.  Prior ORIF proximal LEFT femur.  Nondisplaced fractures of the RIGHT superior and inferior pubic rami.  Femoral head normally located without fracture or dislocation.  Scattered atherosclerotic calcifications.  IMPRESSION: Osseous demineralization.  Nondisplaced RIGHT superior and inferior pubic  rami fractures.  No proximal RIGHT femoral fracture identified.  Prior ORIF proximal LEFT femur.   Electronically Signed   By: Lavonia Dana M.D.   On: 09/02/2014 10:53   Ct Head Wo Contrast  09/02/2014   CLINICAL DATA:  Fall 1 day ago.  EXAM: CT HEAD WITHOUT CONTRAST  TECHNIQUE: Contiguous axial images were obtained from the base of the skull through the vertex without intravenous contrast.  COMPARISON:  08/17/2012  FINDINGS: Global atrophy. Chronic ischemic changes. No mass effect, midline shift, or hemorrhage. Mastoid air cells clear. Visualized paranasal sinuses clear.  IMPRESSION: No acute intracranial pathology.   Electronically Signed   By: Maryclare Bean M.D.   On: 09/02/2014 10:44         Time spent:60 minutes Code Status:   FULL Family Communication:   Wife and daughters updated at bedside   Hajra Port, DO  [facility] Pager [phone]  If 7PM-7AM, please contact  night-coverage [email or web address] Password [facility] 09/02/2014, 12:22 PM

## 2014-09-03 DIAGNOSIS — S32591A Other specified fracture of right pubis, initial encounter for closed fracture: Secondary | ICD-10-CM | POA: Diagnosis not present

## 2014-09-03 DIAGNOSIS — S32509K Unspecified fracture of unspecified pubis, subsequent encounter for fracture with nonunion: Secondary | ICD-10-CM

## 2014-09-03 DIAGNOSIS — M25551 Pain in right hip: Secondary | ICD-10-CM | POA: Diagnosis not present

## 2014-09-03 LAB — BASIC METABOLIC PANEL
Anion gap: 3 — ABNORMAL LOW (ref 5–15)
BUN: 14 mg/dL (ref 6–23)
CO2: 34 mmol/L — AB (ref 19–32)
CREATININE: 0.64 mg/dL (ref 0.50–1.35)
Calcium: 8.2 mg/dL — ABNORMAL LOW (ref 8.4–10.5)
Chloride: 96 mEq/L (ref 96–112)
GFR calc Af Amer: >90 mL/min (ref >90)
GFR, EST NON AFRICAN AMERICAN: 87 mL/min — AB (ref >90)
GLUCOSE: 106 mg/dL — AB (ref 70–99)
Potassium: 3.4 mmol/L — ABNORMAL LOW (ref 3.5–5.1)
Sodium: 133 mmol/L — ABNORMAL LOW (ref 135–145)

## 2014-09-03 LAB — CBC
HEMATOCRIT: 38 % — AB (ref 39.0–52.0)
Hemoglobin: 12.6 g/dL — ABNORMAL LOW (ref 13.0–17.0)
MCH: 29.9 pg (ref 26.0–34.0)
MCHC: 33.2 g/dL (ref 30.0–36.0)
MCV: 90 fL (ref 78.0–100.0)
Platelets: 158 10*3/uL (ref 150–400)
RBC: 4.22 MIL/uL (ref 4.22–5.81)
RDW: 14 % (ref 11.5–15.5)
WBC: 9.3 10*3/uL (ref 4.0–10.5)

## 2014-09-03 LAB — MAGNESIUM: Magnesium: 1.5 mg/dL (ref 1.5–2.5)

## 2014-09-03 MED ORDER — POTASSIUM CHLORIDE CRYS ER 20 MEQ PO TBCR
40.0000 meq | EXTENDED_RELEASE_TABLET | Freq: Once | ORAL | Status: AC
  Administered 2014-09-03: 40 meq via ORAL
  Filled 2014-09-03: qty 2

## 2014-09-03 NOTE — Consult Note (Signed)
NAME: [name] MRN:   [id number] DOB:   May 11, 1928     HISTORY AND PHYSICAL  CHIEF COMPLAINT:  Right hip pain  HISTORY:   Craig Dealmeida Adkinsis a 80 y.o. male with history of HTN, HLD, Dementia, NSCLC (Dr. Julien Nordmann) presented after a mechanical fall on 09/01/2014. The patient was getting around with his usual walker and lost his balance resulting in a mechanical fall. There was no loss of consciousness. The patient has been in his usual state of health without any complaints of fevers, chills, chest pain, shortness breath, coughing, hemoptysis, nausea, vomiting, diarrhea. He normally has loose stools.  PAST MEDICAL HISTORY:   Past Medical History  Diagnosis Date  . Hyperlipidemia   . Hypertension   . Allergy   . Fractured shoulder 8/30?    pt fell about 3 weeks ago and fractured shoulder blade   . Fracture, intertrochanteric, left femur 08/17/2012  . COPD (chronic obstructive pulmonary disease)   . colon ca dx'd 1994    chemo  . Lung cancer dx'd 07/2007    lu-lobectomy    PAST SURGICAL HISTORY:   Past Surgical History  Procedure Laterality Date  . Colon surgery    . Colostomy takedown    . Colostomy revision    . Hernia repair    . Femur fracture surgery  08/17/2012  . Intramedullary (im) nail intertrochanteric  08/17/2012    Procedure: INTRAMEDULLARY (IM) NAIL INTERTROCHANTRIC;  Surgeon: Johnny Bridge, MD;  Location: [facility];  Service: Orthopedics;  Laterality: Left;  Synthes GSN, Fracture Table, Big C arm    MEDICATIONS:   Medications Prior to Admission  Medication Sig Dispense Refill  . acetaminophen (TYLENOL) 325 MG tablet Take 2 tablets (650 mg total) by mouth every 6 (six) hours as needed for pain. (Patient taking differently: Take 325-650 mg by mouth every 6 (six) hours as needed for pain. ) 60 tablet 1  . atorvastatin (LIPITOR) 40 MG tablet Take 40 mg by mouth daily.      . cholecalciferol (VITAMIN D) 1000 UNITS tablet Take 1,000 Units by mouth daily.    .  hydrocortisone 2.5 % cream Apply 1 application topically 2 (two) times daily as needed (rash).   0  . levothyroxine (SYNTHROID, LEVOTHROID) 112 MCG tablet Take 112 mcg by mouth daily.    . magnesium oxide (MAG-OX) 400 MG tablet Take 400 mg by mouth 2 (two) times daily.    . metoprolol succinate (TOPROL-XL) 25 MG 24 hr tablet Take 25 mg by mouth daily.   1  . pantoprazole (PROTONIX) 40 MG tablet Take 40 mg by mouth daily.   0  . triamterene-hydrochlorothiazide (MAXZIDE-25) 37.5-25 MG per tablet Take 1 tablet by mouth daily.   1  . calcium-vitamin D (OSCAL WITH D) 500-200 MG-UNIT per tablet Take 1 tablet by mouth daily. (Patient not taking: Reported on 09/02/2014) 100 tablet 2  . docusate sodium 100 MG CAPS Take 100 mg by mouth 2 (two) times daily. (Patient not taking: Reported on 08/15/2014) 10 capsule   . metoprolol tartrate (LOPRESSOR) 12.5 mg TABS Take 0.5 tablets (12.5 mg total) by mouth 2 (two) times daily. (Patient not taking: Reported on 09/02/2014)    . sennosides-docusate sodium (SENOKOT-S) 8.6-50 MG tablet Take 1 tablet by mouth daily. (Patient not taking: Reported on 08/15/2014) 30 tablet 1    ALLERGIES:   Allergies  Allergen Reactions  . Morphine And Related Other (See Comments)    hallucinations  . Codeine  Pt is allergic to pain medications including morphine     REVIEW OF SYSTEMS:   Negative except HPI  FAMILY HISTORY:   Family History  Problem Relation Age of Onset  . Stroke Mother   . Heart disease Brother     SOCIAL HISTORY:   reports that he has never smoked. He has never used smokeless tobacco. He reports that he does not drink alcohol or use illicit drugs.  PHYSICAL EXAM:  General appearance: alert, cooperative and no distress Resp: clear to auscultation bilaterally Cardio: regular rate and rhythm, S1, S2 normal, no murmur, click, rub or gallop GI: soft, non-tender; bowel sounds normal; no masses,  no organomegaly Pelvic: painful Extremities: extremities  normal, atraumatic, no cyanosis or edema and Homans sign is negative, no sign of DVT Pulses: 2+ and symmetric Skin: Skin color, texture, turgor normal. No rashes or lesions Incision/Wound:    LABORATORY STUDIES:  Recent Labs  09/02/14 1003 09/03/14 0500  WBC 11.1* 9.3  HGB 13.8 12.6*  HCT 41.5 38.0*  PLT 198 158     Recent Labs  09/02/14 1003 09/03/14 0500  NA 135 133*  K 3.6 3.4*  CL 95* 96  CO2 32 34*  GLUCOSE 122* 106*  BUN 14 14  CREATININE 0.83 0.64  CALCIUM 9.0 8.2*    STUDIES/RESULTS:  Dg Hip Complete Right  09/02/2014   CLINICAL DATA:  [place] last night injuring RIGHT hip, RIGHT hip pain  EXAM: RIGHT HIP - COMPLETE 2+ VIEW  COMPARISON:  Pelvic radiograph 08/17/2012  FINDINGS: Osseous demineralization.  Hip and SI joints symmetric.  Prior ORIF proximal LEFT femur.  Nondisplaced fractures of the RIGHT superior and inferior pubic rami.  Femoral head normally located without fracture or dislocation.  Scattered atherosclerotic calcifications.  IMPRESSION: Osseous demineralization.  Nondisplaced RIGHT superior and inferior pubic rami fractures.  No proximal RIGHT femoral fracture identified.  Prior ORIF proximal LEFT femur.   Electronically Signed   By: Lavonia Dana M.D.   On: 09/02/2014 10:53   Ct Head Wo Contrast  09/02/2014   CLINICAL DATA:  Fall 1 day ago.  EXAM: CT HEAD WITHOUT CONTRAST  TECHNIQUE: Contiguous axial images were obtained from the base of the skull through the vertex without intravenous contrast.  COMPARISON:  08/17/2012  FINDINGS: Global atrophy. Chronic ischemic changes. No mass effect, midline shift, or hemorrhage. Mastoid air cells clear. Visualized paranasal sinuses clear.  IMPRESSION: No acute intracranial pathology.   Electronically Signed   By: Maryclare Bean M.D.   On: 09/02/2014 10:44   Ct Chest Wo Contrast  08/14/2014   CLINICAL DATA:  80 year old male with history of lung cancer diagnosed in 2008 and colon cancer in 1994 status post left upper  lobectomy and colonic resection presenting with recent history of cough.  EXAM: CT CHEST WITHOUT CONTRAST  TECHNIQUE: Multidetector CT imaging of the chest was performed following the standard protocol without IV contrast.  COMPARISON:  CT of the chest 08/14/2013.  FINDINGS: Mediastinum: Heart size is normal. There is no significant pericardial fluid, thickening or pericardial calcification. There is atherosclerosis of the thoracic aorta, the great vessels of the mediastinum and the coronary arteries, including calcified atherosclerotic plaque in the left main, left anterior descending, left circumflex and right coronary arteries. Severe calcifications of the mitral annulus. No pathologically enlarged mediastinal or hilar lymph nodes. Please note that accurate exclusion of hilar adenopathy is limited on noncontrast CT scans. Esophagus is unremarkable in appearance.  Lungs/Pleura: Status post left lower lobectomy. Compensatory  hyperexpansion of the left upper lobe. Mild chronic scarring in the base of the left upper lobe. No suspicious appearing pulmonary nodules or masses. Some pleural calcifications in the base of the right hemithorax are incidentally noted and unchanged. No acute consolidative airspace disease. No pleural effusions.  Upper Abdomen: Atherosclerosis.  Otherwise, unremarkable.  Musculoskeletal: There are no aggressive appearing lytic or blastic lesions noted in the visualized portions of the skeleton. Multiple old healed lateral right-sided rib fractures incidentally noted.  IMPRESSION: 1. No findings to suggest locally recurrent disease or metastatic disease to the thorax. 2. Stable postoperative changes, as above. 3. Atherosclerosis, including left main and 3 vessel coronary artery disease. 4. Additional incidental findings, as above.   Electronically Signed   By: Vinnie Langton M.D.   On: 08/14/2014 15:50   Ct Hip Right Wo Contrast  09/02/2014   CLINICAL DATA:  Right hip pain after fall.  Fracture. Initial encounter  EXAM: CT OF THE RIGHT HIP WITHOUT CONTRAST  TECHNIQUE: Multidetector CT imaging of the right hip was performed according to the standard protocol. Multiplanar CT image reconstructions were also generated.  COMPARISON:  Plain films of the pelvis and right hip 09/02/2014.  FINDINGS: There nondisplaced fractures of the right superior and inferior pubic rami. The superior pubic ramus fracture is segmental. There is no right hip fracture. The hip is located. No hip joint effusion is appreciated. Imaged intrapelvic contents demonstrate no acute abnormality with atherosclerosis noted.  IMPRESSION: Nondisplaced right superior and inferior pubic rami fractures.  Negative for hip fracture.   Electronically Signed   By: Inge Rise M.D.   On: 09/02/2014 19:13   Dg Chest Port 1 View  09/02/2014   CLINICAL DATA:  80 year old male with weakness confusion and hypoxia. Initial encounter.  EXAM: PORTABLE CHEST - 1 VIEW  COMPARISON:  Chest CT 08/14/2014 and earlier. Including abdomen radiographs 04/15/2011.  FINDINGS: Portable AP upright view at 1329 hrs. Gas distended large bowel loops in the upper abdomen with chronic colonic interposition between the liver and the right hemidiaphragm. No definite pneumoperitoneum. Chronic elevation of the right hemidiaphragm, stable. Stable cardiac size and mediastinal contours. No pneumothorax or pulmonary edema. No pleural effusion or consolidation. Multilevel chronic right rib fractures.  IMPRESSION: 1.  No acute cardiopulmonary abnormality. 2. Abnormal increased lucency in the visible upper abdomen felt related to gas distended large bowel rather than pneumoperitoneum. If there is abdominal pain recommend dedicated abdominal series to include upright or left-side-down lateral decubitus views.   Electronically Signed   By: Lars Pinks M.D.   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 9:25 AM

## 2014-09-03 NOTE — Evaluation (Signed)
Occupational Therapy Evaluation Patient Details Name: [name] MRN: [id number] DOB: [birth year] Today's Date: 09/03/2014    History of Present Illness 80 yo male admitted with R nondisplaced pubic rami fractures. Hx of dementia, HTn , NSCLC. Pt is from home with wife   Clinical Impression   Pt currently limited by pain and also groggy during session. Pivot back to bed with +2 mod to max assist using walker. Pt will need SNF at d/c as wife states she is will be unable to assist pt. Will follow on acute to progress ADL independence.     Follow Up Recommendations  SNF;Supervision/Assistance - 24 hour    Equipment Recommendations  3 in 1 bedside comode    Recommendations for Other Services       Precautions / Restrictions Precautions Precautions: Fall Restrictions Weight Bearing Restrictions: No Other Position/Activity Restrictions: WBAT R LE      Mobility Bed Mobility Overal bed mobility: Needs Assistance Bed Mobility: Sit to Supine      Sit to supine: +2 for physical assistance;+2 for safety/equipment;Max assist   General bed mobility comments: assist for lowering shoulders to bed and assist for LEs onto bed.  Transfers Overall transfer level: Needs assistance Equipment used: Rolling walker (2 wheeled) Transfers: Sit to/from Stand Sit to Stand: +2 physical assistance;+2 safety/equipment;Mod assist Stand pivot transfers: Max assist;+2 physical assistance;+2 safety/equipment       General transfer comment: 2 attempts to stand. Max cues for hand placement, LE positioning, weight shifting. Much difficulty with pivot steps using walker toward strong side to step around to bed.    Balance Overall balance assessment: Needs assistance         Standing balance support: Bilateral upper extremity supported;During functional activity Standing balance-Leahy Scale: Poor                              ADL Overall ADL's : Needs  assistance/impaired Eating/Feeding: Set up;Supervision/ safety;Sitting   Grooming: Wash/dry face;Set up;Supervision/safety;Sitting   Upper Body Bathing: Moderate assistance;Sitting   Lower Body Bathing: +2 for safety/equipment;+2 for physical assistance;Total assistance;Sit to/from stand   Upper Body Dressing : Moderate assistance;Sitting   Lower Body Dressing: +2 for safety/equipment;+2 for physical assistance;Total assistance;Sit to/from stand   Toilet Transfer: +2 for physical assistance;+2 for safety/equipment;Stand-pivot;RW;Maximal assistance   Toileting- Clothing Manipulation and Hygiene: +2 for safety/equipment;+2 for physical assistance;Total assistance;Sit to/from stand         General ADL Comments: Pt with difficulty during sit to stand transitions and needed 2 attempts and max cues for hand placement, weight shift, and posture. +2 for pivot over to bed toward stronger side. IV site with tape coming loose but nursing in room to assist  with back to bed and stated she would look at IV once pt back in bed. wife present and discussed SNF recommendation with pt and wife.     Vision                     Perception     Praxis      Pertinent Vitals/Pain Pain Assessment: 0-10 Faces Pain Scale: Hurts even more Pain Location: R LE Pain Descriptors / Indicators: Grimacing Pain Intervention(s): Repositioned     Hand Dominance     Extremity/Trunk Assessment Upper Extremity Assessment Upper Extremity Assessment: RUE deficits/detail RUE Deficits / Details: pt only able to raise R shoulder to 90 degrees shoulder flexion; elbow distal WFL for ROM but  generalized weakness.      Cervical / Trunk Assessment Cervical / Trunk Assessment: Kyphotic   Communication Communication Communication: No difficulties   Cognition Arousal/Alertness: Lethargic (pt sleepy during eval, trying to keep eyes open) Behavior During Therapy: Flat affect Overall Cognitive Status: Within  Functional Limits for tasks assessed                     General Comments       Exercises       Shoulder Instructions      Home Living Family/patient expects to be discharged to:: Skilled nursing facility Living Arrangements: Spouse/significant other Available Help at Discharge: Available 24 hours/day Type of Home: House Home Access: Stairs to enter [employer] of Steps: 2 Entrance Stairs-Rails: Right;Left;Can reach both Home Layout: One level               Home Equipment: Walker - 2 wheels          Prior Functioning/Environment Level of Independence: Independent with assistive device(s);Needs assistance    ADL's / Homemaking Assistance Needed: wife states she helps with bathing, dressing, toileting but pt able to help some.        OT Diagnosis: Generalized weakness;Acute pain   OT Problem List: Decreased strength;Decreased knowledge of use of DME or AE;Pain   OT Treatment/Interventions: Self-care/ADL training;Patient/family education;Therapeutic activities;DME and/or AE instruction    OT Goals(Current goals can be found in the care plan section) Acute Rehab OT Goals Patient Stated Goal: wife would like pt to be more independent again OT Goal Formulation: With patient/family Time For Goal Achievement: 09/17/14 Potential to Achieve Goals: Good  OT Frequency: Min 2X/week   Barriers to D/C:            Co-evaluation              End of Session Equipment Utilized During Treatment: Gait belt;Rolling walker  Activity Tolerance: Patient limited by pain;Other (comment) (pt groggy also) Patient left: in bed;with call bell/phone within reach;with family/visitor present;with nursing/sitter in room   Time: [id number] OT Time Calculation (min): 30 min Charges:  OT General Charges $OT Visit: 1 Procedure OT Evaluation $Initial OT Evaluation Tier I: 1 Procedure OT Treatments $Therapeutic Activity: 8-22 mins G-Codes:    Jules Schick  [phone] 09/03/2014, 12:08 PM

## 2014-09-03 NOTE — Evaluation (Addendum)
Physical Therapy Evaluation Patient Details Name: [name] MRN: [id number] DOB: September 24, 1927 Today's Date: 09/03/2014   History of Present Illness  80 yo male admitted with R nondisplaced pubic rami fractures. Hx of dementia, HTn , NSCLC. Pt is from home with wife  Clinical Impression  On eval, pt required Mod assist +2 for mobility-only able to take a few pivotal steps from bed to recliner using walker. Pt did not tolerate standing and pivot very well so did not attempt ambulation this session. Wife present-discussed d/c plan-recommendation is [facility].   Follow Up Recommendations SNF;Supervision/Assistance - 24 hour    Equipment Recommendations  None recommended by PT    Recommendations for Other Services OT consult     Precautions / Restrictions Precautions Precautions: Fall Restrictions Weight Bearing Restrictions: No-WBAT      Mobility  Bed Mobility Overal bed mobility: Needs Assistance Bed Mobility: Supine to Sit     Supine to sit: Mod assist;+2 for physical assistance;+2 for safety/equipment;HOB elevated     General bed mobility comments: Increased time and MAX verbal, tactile cues. Utilized bedpad for scooting, positioning.   Transfers Overall transfer level: Needs assistance Equipment used: Rolling walker (2 wheeled) Transfers: Sit to/from [employer] Sit to Stand: Mod assist;+2 safety/equipment;+2 physical assistance;From elevated surface Stand pivot transfers: Mod assist;+2 physical assistance;+2 safety/equipment       General transfer comment: Assist to rise, stabilize, control descent. Sit to stand x 3 due to pt requesting to sit after each attempt at standing. MAX verba, tactile,cues. Wife assisted with motivating pt as well.  Ambulation/Gait             General Gait Details: NT-antalgic gait pattern with pivotal steps. Pt was not tolerating minimal activity enough to attempt ambulation.  Stairs             Wheelchair Mobility    Modified Rankin (Stroke Patients Only)       Balance Overall balance assessment: Needs assistance         Standing balance support: Bilateral upper extremity supported;During functional activity Standing balance-Leahy Scale: Poor                               Pertinent Vitals/Pain Pain Assessment: Faces Faces Pain Scale: Hurts even more Pain Location: R LE when mobilizing Pain Descriptors / Indicators: Grimacing Pain Intervention(s): Limited activity within patient's tolerance    Home Living Family/patient expects to be discharged to:: Skilled nursing facility Living Arrangements: Spouse/significant other Available Help at Discharge: Available 24 hours/day Type of Home: House Home Access: Stairs to enter Entrance Stairs-Rails: Right;Left;Can reach both Entrance Stairs-Number of Steps: 2 Home Layout: One level Home Equipment: Environmental consultant - 2 wheels      Prior Function Level of Independence: Independent with assistive device(s)               Hand Dominance        Extremity/Trunk Assessment   Upper Extremity Assessment: Defer to OT evaluation           Lower Extremity Assessment: Generalized weakness      Cervical / Trunk Assessment: Kyphotic  Communication   Communication: No difficulties  Cognition Arousal/Alertness: Awake/alert Behavior During Therapy: Flat affect Overall Cognitive Status: History of cognitive impairments - at baseline                      General Comments  Exercises        Assessment/Plan    PT Assessment Patient needs continued PT services  PT Diagnosis Difficulty walking;Generalized weakness;Abnormality of gait;Acute pain;Altered mental status   PT Problem List Decreased activity tolerance;Decreased balance;Decreased mobility;Decreased cognition;Pain;Decreased knowledge of use of DME  PT Treatment Interventions DME instruction;Gait training;Functional mobility  training;Therapeutic activities;Patient/family education;Balance training   PT Goals (Current goals can be found in the Care Plan section) Acute Rehab PT Goals Patient Stated Goal: wife would like for pt to return home PT Goal Formulation: With family Time For Goal Achievement: 09/17/14 Potential to Achieve Goals: Fair    Frequency Min 3X/week   Barriers to discharge        Co-evaluation               End of Session Equipment Utilized During Treatment: Gait belt Activity Tolerance: Patient limited by pain (Limited by cognition) Patient left: in chair;with call bell/phone within reach;with family/visitor present           Time: 0913-0931 PT Time Calculation (min) (ACUTE ONLY): 18 min   Charges:   PT Evaluation $Initial PT Evaluation Tier I: 1 Procedure PT Treatments $Therapeutic Activity: 8-22 mins   PT G Codes:        Weston Anna, MPT Pager: [phone]

## 2014-09-03 NOTE — Progress Notes (Addendum)
Patient ID: [name], male   DOB: 12-23-1927, 80 y.o.   MRN: [id number] [facility] PROGRESS NOTE  KACY CONELY WUJ:[id number] DOB: 08-11-1928 DOA: 09/02/2014 PCP: Marylene Land, MD  Brief narrative:    80 year old male with history of HTN, HLD, Dementia, NSCLC (patient's primary oncologist Dr. Julien Nordmann) presented after a mechanical fall on 09/01/2014. Patient's wife reported that patient has frequent myoclonic jerks which she thinks it makes him weak and as a result he falls. On admission, X-ray of the right hip revealed a nondisplaced right superior inferior pubic rami fracture. CT of the brain was unremarkable. BMP and CBC were essentially unremarkable with WBC count 11.1, hemoglobin 13.8. Per ortho, non-operative management and PT evaluation recommended.   Assessment/Plan:    Principal Problem: Right pubic rami fracture  EDP has consulted Dr. Rhea Pink who recommended pain control and PT/OT evaluation. Non-operative management.  Continue supportive care with analgesia as needed.  Active Problems: R-leg edema  Venous doppler negative for DVT  Essential Hypertension  Continue metoprolol 25 mg daily  Hypothyroidism  Continue Synthroid  Hyperlipidemia  Continue statin  NSCLC  Per oncology management   not actively receiving chemo  s/p LUL lobectomey  DVT Prophylaxis   Lovenox subQ ordered   Code Status: Full.  Family Communication:  plan of care discussed with the patient and his wife at the bedside  Disposition Plan: Home when stable.   IV access:  Peripheral IV  Procedures and diagnostic studies:    Dg Hip Complete Right 09/02/2014   Osseous demineralization.  Nondisplaced RIGHT superior and inferior pubic rami fractures.  No proximal RIGHT femoral fracture identified.  Prior ORIF proximal LEFT femur.   Ct Head Wo Contrast 09/02/2014   No acute intracranial pathology.    Ct Hip Right Wo Contrast 09/02/2014     Nondisplaced right  superior and inferior pubic rami fractures.  Negative for hip fracture.     Dg Chest Port 1 View 09/02/2014   1.  No acute cardiopulmonary abnormality. 2. Abnormal increased lucency in the visible upper abdomen felt related to gas distended large bowel rather than pneumoperitoneum. If there is abdominal pain recommend dedicated abdominal series to include upright or left-side-down lateral decubitus views.      Medical Consultants:  Orthopedic surgery  Other Consultants:  Physical therapy  IAnti-Infectives:   None    Leisa Lenz, MD  [facility] Pager [phone]  If 7PM-7AM, please contact night-coverage [email or web address] Password [facility] 09/03/2014, 8:35 AM   LOS: 1 day    HPI/Subjective: No acute overnight events.  Objective: Filed Vitals:   09/02/14 1239 09/02/14 1320 09/02/14 2034 09/03/14 0510  BP: 131/77 151/64 139/71 131/65  Pulse: 82 74 71 77  Temp: 98.3 F (36.8 C) 97.9 F (36.6 C) 98 F (36.7 C) 97.8 F (36.6 C)  TempSrc: Oral Oral Oral Oral  Resp:  18 16 16   Height:  6' (1.829 m)    Weight:  86.183 kg (190 lb)    SpO2: 92% 99% 99% 100%    Intake/Output Summary (Last 24 hours) at 09/03/14 0835 Last data filed at 09/03/14 0556  Gross per 24 hour  Intake 696.25 ml  Output      0 ml  Net 696.25 ml    Exam:   General:  Pt is not in acute distress  Cardiovascular: Regular rate and rhythm, S1/S2 appreciated   Respiratory: no wheezing, no crackles, no rhonchi  Abdomen: Soft, non tender, non distended, bowel sounds present  Extremities: pulses DP and PT palpable bilaterally  Neuro: Grossly nonfocal  Data Reviewed: Basic Metabolic Panel:  Recent Labs Lab 09/02/14 1003 09/03/14 0500  NA 135 133*  K 3.6 3.4*  CL 95* 96  CO2 32 34*  GLUCOSE 122* 106*  BUN 14 14  CREATININE 0.83 0.64  CALCIUM 9.0 8.2*  MG  --  1.5   Liver Function Tests: No results for input(s): AST, ALT, ALKPHOS, BILITOT, PROT, ALBUMIN in the last 168 hours. No  results for input(s): LIPASE, AMYLASE in the last 168 hours. No results for input(s): AMMONIA in the last 168 hours. CBC:  Recent Labs Lab 09/02/14 1003 09/03/14 0500  WBC 11.1* 9.3  HGB 13.8 12.6*  HCT 41.5 38.0*  MCV 89.6 90.0  PLT 198 158   Cardiac Enzymes: No results for input(s): CKTOTAL, CKMB, CKMBINDEX, TROPONINI in the last 168 hours. BNP: Invalid input(s): POCBNP CBG: No results for input(s): GLUCAP in the last 168 hours.  No results found for this or any previous visit (from the past 240 hour(s)).   Scheduled Meds: . atorvastatin  40 mg Oral Daily  . cholecalciferol  1,000 Units Oral Daily  . docusate sodium  100 mg Oral BID  . enoxaparin (LOVENOX) injection  40 mg Subcutaneous Q24H  . levothyroxine  112 mcg Oral QAC breakfast  . magnesium oxide  400 mg Oral BID  . metoprolol succinate  25 mg Oral Daily  . pantoprazole  40 mg Oral Daily  . potassium chloride  40 mEq Oral Once  . senna-docusate  1 tablet Oral Daily

## 2014-09-03 NOTE — Care Management Note (Signed)
CARE MANAGEMENT NOTE 09/03/2014  Patient:  [name], [name]   Account Number:  [id number]  Date Initiated:  09/03/2014  Documentation initiated by:  Marney Doctor  Subjective/Objective Assessment:   80 yo admitted with fracture of multiple pubic rami     Action/Plan:   From home with spouse   Anticipated DC Date:  09/04/2014   Anticipated DC Plan:  [place]         Choice offered to / List presented to:             Status of service:  In process, will continue to follow Medicare Important Message given?   (If response is "NO", the following Medicare IM given date fields will be blank) Date Medicare IM given:   Medicare IM given by:   Date Additional Medicare IM given:   Additional Medicare IM given by:    Discharge Disposition:    Per UR Regulation:  Reviewed for med. necessity/level of care/duration of stay  If discussed at [place] of Stay Meetings, dates discussed:    Comments:  Marney Doctor RN,BSN,NCM

## 2014-09-04 DIAGNOSIS — M25551 Pain in right hip: Secondary | ICD-10-CM | POA: Diagnosis not present

## 2014-09-04 DIAGNOSIS — S32591A Other specified fracture of right pubis, initial encounter for closed fracture: Secondary | ICD-10-CM | POA: Diagnosis not present

## 2014-09-04 LAB — BASIC METABOLIC PANEL
Anion gap: 4 — ABNORMAL LOW (ref 5–15)
BUN: 14 mg/dL (ref 6–23)
CO2: 34 mmol/L — ABNORMAL HIGH (ref 19–32)
CREATININE: 0.78 mg/dL (ref 0.50–1.35)
Calcium: 8.3 mg/dL — ABNORMAL LOW (ref 8.4–10.5)
Chloride: 96 mEq/L (ref 96–112)
GFR calc Af Amer: >90 mL/min (ref >90)
GFR, EST NON AFRICAN AMERICAN: 81 mL/min — AB (ref >90)
GLUCOSE: 109 mg/dL — AB (ref 70–99)
POTASSIUM: 3.6 mmol/L (ref 3.5–5.1)
Sodium: 134 mmol/L — ABNORMAL LOW (ref 135–145)

## 2014-09-04 MED ORDER — ONDANSETRON HCL 4 MG PO TABS: 4.0000 mg | ORAL_TABLET | Freq: Four times a day (QID) | ORAL | Status: DC | PRN

## 2014-09-04 NOTE — Progress Notes (Signed)
Clinical Social Work Department BRIEF PSYCHOSOCIAL ASSESSMENT 09/04/2014  Patient:  [name], [name]     Account Number:  [id number]     Admit date:  09/02/2014  Clinical Social Worker:  Maryln Manuel  Date/Time:  09/04/2014 09:30 AM  Referred by:  Physician  Date Referred:  09/04/2014 Referred for  SNF Placement   Other Referral:   Interview type:  Family Other interview type:    PSYCHOSOCIAL DATA Living Status:  WIFE Admitted from facility:   Level of care:   Primary support name:  [name]/wife/[phone] Primary support relationship to patient:  SPOUSE Degree of support available:   strong    CURRENT CONCERNS Current Concerns  Post-Acute Placement   Other Concerns:    SOCIAL WORK ASSESSMENT / PLAN CSW received referral for new SNF. CSW reviewed chart and noted that pt meeting observation criteria only and has Medicare.    CSW met with pt and pt wife at bedside. CSW introduced self and explained role. Pt sleeping soundly at this time. CSW provided support to pt wife as she discussed concern that pt only meeting observation criteria and pt wife aware that she cannot provide pt the care he needs at home. CSW discussed with pt wife that medicare will not cover SNF placement due to pt not meeting inpatient criteria, but CSW can explore SNF options as private pay. Pt wife expressed understanding and agreeable to CSW exploring private pay options for SNF.    CSW appreciates RNCM providing pt with list of options when CSW had private pay options available.    CSW followed up with pt re: decision for SNF as private pay. Pt wife interested in [facility] as pt has been to facility in the past.    CSW contacted [facility] and confirmed that facility can accept pt today as private pay. Pt wife agreeable and pt wife plans to go to [facility] and facility will contact this CSW when pt wife arrives in order for CSW to arrange ambulance transport.    CSW to facilitate pt  discharge needs this afternoon.   Assessment/plan status:  Psychosocial Support/Ongoing Assessment of Needs Other assessment/ plan:   discharge planning   Information/referral to community resources:   [facility] list    PATIENT'S/FAMILY'S RESPONSE TO PLAN OF CARE: Pt oriented to person only. Pt wife supportive and actively involved in pt care. Pt wife recognizes that she cannot provide the care that pt needs at home and is agreeable to privately paying for SNF at [facility].    Alison Murray, MSW, [place] Work [phone]

## 2014-09-04 NOTE — Progress Notes (Signed)
   09/04/14 1339  PT Time Calculation  PT Start Time (ACUTE ONLY) 1248  PT Stop Time (ACUTE ONLY) 1311  PT Time Calculation (min) (ACUTE ONLY) 23 min  PT G-Codes **NOT FOR INPATIENT CLASS**  Functional Assessment Tool Used (clinical judgement)  Functional Limitation Mobility: Walking and moving around  Mobility: Walking and Moving Around Current Status (B6384) CL  Mobility: Walking and Moving Around Goal Status (T3646) CJ  Mobility: Walking and Moving Around Discharge Status (O0321) CL  PT General Charges  $$ ACUTE PT VISIT 1 Procedure  PT Treatments  $Therapeutic Activity 23-37 mins   Weston Anna, MPT [phone]

## 2014-09-04 NOTE — Progress Notes (Signed)
Patient ID: [name], male   DOB: 09/04/1928, 80 y.o.   MRN: [id number] [facility] PROGRESS NOTE  TERY HOEGER WUJ:[id number] DOB: Jan 07, 1928 DOA: 09/02/2014 PCP: Marylene Land, MD  Brief narrative:    80 year old male with history of HTN, HLD, Dementia, NSCLC (patient's primary oncologist Dr. Julien Nordmann) presented after a mechanical fall on 09/01/2014. Patient's wife reported that patient has frequent myoclonic jerks which she thinks it makes him weak and as a result he falls. On admission, X-ray of the right hip revealed a nondisplaced right superior inferior pubic rami fracture. CT of the brain was unremarkable. BMP and CBC were essentially unremarkable with WBC count 11.1, hemoglobin 13.8. Per ortho, non-operative management and PT evaluation recommended.    Assessment/Plan:     Principal Problem: Right pubic rami fracture  EDP has consulted Dr. Rhea Pink who recommended pain control and PT/OT evaluation. Non-operative management.  Continue supportive care with analgesia as needed.  Active Problems: R-leg edema  Venous doppler negative for DVT  Essential Hypertension  Continue metoprolol 25 mg daily  Hypothyroidism  Continue Synthroid  Hyperlipidemia  Continue statin  NSCLC  Per oncology management   Not actively receiving chemo  s/p LUL lobectomey  DVT Prophylaxis   Lovenox subQ ordered   Code Status: Full.  Family Communication:  plan of care discussed with the patient and his wife at the bedside  Disposition Plan: Home when stable. HH orders in place.    IV access:  Peripheral IV  Procedures and diagnostic studies:    Dg Hip Complete Right 09/02/2014   Osseous demineralization.  Nondisplaced RIGHT superior and inferior pubic rami fractures.  No proximal RIGHT femoral fracture identified.  Prior ORIF proximal LEFT femur.   Ct Head Wo Contrast 09/02/2014   No acute intracranial pathology.    Ct Hip Right Wo Contrast 09/02/2014      Nondisplaced right superior and inferior pubic rami fractures.  Negative for hip fracture.     Dg Chest Port 1 View 09/02/2014   1.  No acute cardiopulmonary abnormality. 2. Abnormal increased lucency in the visible upper abdomen felt related to gas distended large bowel rather than pneumoperitoneum. If there is abdominal pain recommend dedicated abdominal series to include upright or left-side-down lateral decubitus views.      Medical Consultants:  Orthopedic surgery  Other Consultants:  Physical therapy  IAnti-Infectives:   None     Leisa Lenz, MD  [facility] Pager [phone]  If 7PM-7AM, please contact night-coverage [email or web address] Password [facility] 09/04/2014, 11:46 AM   LOS: 2 days    HPI/Subjective: No acute overnight events.  Objective: Filed Vitals:   09/03/14 0510 09/03/14 1400 09/03/14 2054 09/04/14 0533  BP: 131/65 103/53 114/50 105/61  Pulse: 77 89 82 86  Temp: 97.8 F (36.6 C) 98.4 F (36.9 C) 98 F (36.7 C) 98.6 F (37 C)  TempSrc: Oral Oral Oral Oral  Resp: 16 16 18 15   Height:      Weight:      SpO2: 100% 99% 92% 93%    Intake/Output Summary (Last 24 hours) at 09/04/14 1146 Last data filed at 09/04/14 0533  Gross per 24 hour  Intake    600 ml  Output      0 ml  Net    600 ml    Exam:   General:  Pt is not in acute distress  Cardiovascular: Regular rate and rhythm, S1/S2 (+)  Respiratory:  no wheezing, no crackles, no rhonchi  Abdomen:  Soft, non tender, non distended, bowel sounds present    Data Reviewed: Basic Metabolic Panel:  Recent Labs Lab 09/02/14 1003 09/03/14 0500 09/04/14 0710  NA 135 133* 134*  K 3.6 3.4* 3.6  CL 95* 96 96  CO2 32 34* 34*  GLUCOSE 122* 106* 109*  BUN 14 14 14   CREATININE 0.83 0.64 0.78  CALCIUM 9.0 8.2* 8.3*  MG  --  1.5  --    Liver Function Tests: No results for input(s): AST, ALT, ALKPHOS, BILITOT, PROT, ALBUMIN in the last 168 hours. No results for input(s): LIPASE, AMYLASE  in the last 168 hours. No results for input(s): AMMONIA in the last 168 hours. CBC:  Recent Labs Lab 09/02/14 1003 09/03/14 0500  WBC 11.1* 9.3  HGB 13.8 12.6*  HCT 41.5 38.0*  MCV 89.6 90.0  PLT 198 158   Cardiac Enzymes: No results for input(s): CKTOTAL, CKMB, CKMBINDEX, TROPONINI in the last 168 hours. BNP: Invalid input(s): POCBNP CBG: No results for input(s): GLUCAP in the last 168 hours.  No results found for this or any previous visit (from the past 240 hour(s)).   Scheduled Meds: . atorvastatin  40 mg Oral Daily  . cholecalciferol  1,000 Units Oral Daily  . docusate sodium  100 mg Oral BID  . enoxaparin (LOVENOX) injection  40 mg Subcutaneous Q24H  . levothyroxine  112 mcg Oral QAC breakfast  . magnesium oxide  400 mg Oral BID  . metoprolol succinate  25 mg Oral Daily  . pantoprazole  40 mg Oral Daily  . senna-docusate  1 tablet Oral Daily   Continuous Infusions:

## 2014-09-04 NOTE — Progress Notes (Signed)
Pt for discharge to [facility] as private pay.   CSW facilitated pt discharge needs including contacting facility, faxing pt discharge information via [facility], discussing with pt and pt wife at bedside, providing RN phone number to call report, and arranging ambulance transport via [facility] for pt to [facility].  Pt wife appreciative of CSW assistance and is pleased that [facility] accept pt as private pay as pt has been to [facility] in the past.  No further social work needs identified at this time.  CSW signing off.   Alison Murray, MSW, [place] Work [phone]

## 2014-09-04 NOTE — Progress Notes (Signed)
Physical Therapy Treatment Patient Details Name: [name] MRN: [id number] DOB: 1928/03/08 Today's Date: 09/04/2014    History of Present Illness 80 yo male admitted with R nondisplaced pubic rami fractures. Hx of dementia, HTn , NSCLC. Pt is from home with wife    PT Comments    Pt required increased assistance on today. Unable to sit unsupported. Leaning to the R in sitting and standing (heavily in standing, moderately in sitting). HIGH RISK FOR FALLS!  Do not feel his wife can manage safely with him at home. Highly recommend SNF placement at this time.   Follow Up Recommendations  SNF;Supervision/Assistance - 24 hour     Equipment Recommendations  None recommended by PT    Recommendations for Other Services OT consult     Precautions / Restrictions Precautions Precautions: Fall Restrictions Weight Bearing Restrictions: No Other Position/Activity Restrictions: WBAT R LE    Mobility  Bed Mobility Overal bed mobility: Needs Assistance Bed Mobility: Supine to Sit;Sit to Supine     Supine to sit: Max assist;+2 for physical assistance;+2 for safety/equipment;HOB elevated Sit to supine: Total assist;+2 for physical assistance;+2 for safety/equipment   General bed mobility comments: Increased assist for trunk and bil LEs. Utilized bedpad for scooting, positioning. Pt had difficulty planning, initiating, completing task.   Transfers Overall transfer level: Needs assistance Equipment used: Rolling walker (2 wheeled) Transfers: Sit to/from Stand Sit to Stand: Max assist;+2 physical assistance;+2 safety/equipment;From elevated surface         General transfer comment: Multiple attempts (3) to stand at EOB with RW. Max cueing for hand placement, posture, weight shifting. Pt leaning heavily to R side on each standing attempt-unable to self correct. Unable to pivot or attempt steps due to risk for falling.   Ambulation/Gait                 Stairs             Wheelchair Mobility    Modified Rankin (Stroke Patients Only)       Balance   Sitting-balance support: Bilateral upper extremity supported;Feet supported Sitting balance-Leahy Scale: Poor Sitting balance - Comments: Pt leaning to R side despite cueing and assist.    Standing balance support: Bilateral upper extremity supported Standing balance-Leahy Scale: Zero Standing balance comment: Pt leaning heavily to R side despite cueing and assist.                     Cognition Arousal/Alertness: Awake/alert Behavior During Therapy: Flat affect Overall Cognitive Status: History of cognitive impairments - at baseline (today pt having even more difficulty following commands and mulitmodal cueing)                      Exercises      General Comments        Pertinent Vitals/Pain Pain Assessment: Faces Faces Pain Scale: Hurts even more Pain Location: R LE with movement Pain Descriptors / Indicators: Grimacing;Guarding Pain Intervention(s): Repositioned    Home Living                      Prior Function            PT Goals (current goals can now be found in the care plan section) Progress towards PT goals: Not progressing toward goals - comment (pt requiring increased assistance. Unable to pivot on today. Made RN aware of changes. )    Frequency  Min 3X/week  PT Plan Current plan remains appropriate    Co-evaluation             End of Session   Activity Tolerance: Patient tolerated treatment well Patient left: in bed;with call bell/phone within reach;with bed alarm set;with family/visitor present     Time: 5830-9407 PT Time Calculation (min) (ACUTE ONLY): 23 min  Charges:  $Therapeutic Activity: 23-37 mins                    G Codes:      Weston Anna, MPT Pager: [phone]

## 2014-09-04 NOTE — Progress Notes (Addendum)
   09/03/14 1045  PT Time Calculation  PT Start Time (ACUTE ONLY) 0913  PT Stop Time (ACUTE ONLY) 0931  PT Time Calculation (min) (ACUTE ONLY) 18 min  PT G-Codes **NOT FOR INPATIENT CLASS**  Functional Assessment Tool Used (clinical judgement)  Functional Limitation Mobility: Walking and moving around  Mobility: Walking and Moving Around Current Status (F5436) CK  Mobility: Walking and Moving Around Goal Status (G6770) CJ  PT General Charges  $$ ACUTE PT VISIT 1 Procedure  PT Evaluation  $Initial PT Evaluation Tier I 1 Procedure  PT Treatments  $Therapeutic Activity 8-22 mins  Weston Anna, MPT [phone]

## 2014-09-04 NOTE — Progress Notes (Signed)
Clinical Social Work Department CLINICAL SOCIAL WORK PLACEMENT NOTE 09/04/2014  Patient:  [name], [name]  Account Number:  [id number] Admit date:  09/02/2014  Clinical Social Worker:  Maryln Manuel  Date/time:  09/04/2014 09:30 AM  Clinical Social Work is seeking post-discharge placement for this patient at the following level of care:   SKILLED NURSING   (*CSW will update this form in Epic as items are completed)   09/04/2014  Patient/family provided with [place] Department of Clinical Social Work's list of facilities offering this level of care within the geographic area requested by the patient (or if unable, by the patient's family).  09/04/2014  Patient/family informed of their freedom to choose among providers that offer the needed level of care, that participate in Medicare, Medicaid or managed care program needed by the patient, have an available bed and are willing to accept the patient.  09/04/2014  Patient/family informed of [facility]' ownership interest in [facility], as well as of the fact that they are under no obligation to receive care at this facility.  PASARR submitted to EDS on 09/04/2014 PASARR number received on 09/04/2014  FL2 transmitted to all facilities in geographic area requested by pt/family on  09/04/2014 FL2 transmitted to all facilities within larger geographic area on   Patient informed that his/her managed care company has contracts with or will negotiate with  certain facilities, including the following:     Patient/family informed of bed offers received:  09/04/2014 Patient chooses bed at [place] Physician recommends and patient chooses bed at    Patient to be transferred to [place] on  09/04/2014 Patient to be transferred to facility by ambulance ([facility]) Patient and family notified of transfer on 09/04/2014 Name of family member notified:  pt wife notified at bedside  The following physician request were  entered in Epic:   Additional Comments:   Alison Murray, MSW, [place] Work [phone]

## 2014-09-04 NOTE — Discharge Summary (Signed)
Physician Discharge Summary  GERLAD PELZEL NID:[id number] DOB: 04-20-1928 DOA: September 14, 2014  PCP: Marylene Land, MD  Admit date: 2014/09/14 Discharge date: 09/04/2014  Recommendations for Outpatient Follow-up:  1. Check CBC and BMP per SNF protocol.  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 Julien Nordmann) presented after a mechanical fall on 09/01/2014. Patient's wife reported that patient has frequent myoclonic jerks which she thinks it makes him weak and as a result he falls. On admission, X-ray of the right hip revealed a nondisplaced right superior inferior pubic rami fracture. CT of the brain was unremarkable. BMP and CBC were essentially unremarkable with WBC count 11.1, hemoglobin 13.8. Per ortho, non-operative management and PT evaluation recommended.    Assessment/Plan:     Principal Problem: Right pubic rami fracture  EDP has consulted Dr. Rhea Pink who recommended pain control and PT/OT evaluation. Non-operative management.  Continue supportive care.  Active Problems: R-leg edema  Venous doppler negative for DVT  Essential Hypertension  Continue metoprolol 25 mg daily  Hypothyroidism  Continue Synthroid  Hyperlipidemia  Continue statin  NSCLC  Per oncology management   Not actively receiving chemo  s/p LUL lobectomey  DVT Prophylaxis   Lovenox subQ ordered while pt is in hospital.   Code Status: Full.  Family Communication: plan of care discussed with the patient and his wife at the bedside     IV access:  Peripheral IV  Procedures and diagnostic studies:   Dg Hip Complete Right 2014/09/14 Osseous  demineralization. Nondisplaced RIGHT superior and inferior pubic rami fractures. No proximal RIGHT femoral fracture identified. Prior ORIF proximal LEFT femur.   Ct Head Wo Contrast 09/14/2014 No acute intracranial pathology.   Ct Hip Right Wo Contrast 14-Sep-2014 Nondisplaced right superior and inferior pubic rami fractures. Negative for hip fracture.   Dg Chest Port 1 View 14-Sep-2014 1. No acute cardiopulmonary abnormality. 2. Abnormal increased lucency in the visible upper abdomen felt related to gas distended large bowel rather than pneumoperitoneum. If there is abdominal pain recommend dedicated abdominal series to include upright or left-side-down lateral decubitus views.    Medical Consultants:  Orthopedic surgery  Other Consultants:  Physical therapy  IAnti-Infectives:   None   Signed:  Leisa Lenz, MD  [facility] 09/04/2014, 1:28 PM  Pager #: [phone]   Discharge Exam: Filed Vitals:   09/04/14 0533  BP: 105/61  Pulse: 86  Temp: 98.6 F (37 C)  Resp: 15   Filed Vitals:   09/03/14 0510 09/03/14 1400 09/03/14 2054 09/04/14 0533  BP: 131/65 103/53 114/50 105/61  Pulse: 77 89 82 86  Temp: 97.8 F (36.6 C) 98.4 F (36.9 C) 98 F (36.7 C) 98.6 F (37 C)  TempSrc: Oral Oral Oral Oral  Resp: 16 16 18 15   Height:      Weight:      SpO2: 100% 99% 92% 93%    General: Pt is not in acute distress Cardiovascular: Regular rate and rhythm, S1/S2 (+) Respiratory: no wheezing, no crackles, no rhonchi Abdominal: non distended, bowel sounds +, no guarding Extremities: no cyanosis, pulses palpable bilaterally DP and PT Neuro: Grossly nonfocal  Discharge Instructions  Discharge Instructions    Call MD for:  difficulty breathing, headache or visual disturbances    Complete by:  As directed      Call MD for:  redness, tenderness, or signs of infection (pain, swelling, redness, odor or green/yellow discharge around incision site)     Complete by:  As directed      Call MD for:  severe uncontrolled pain    Complete by:  As directed      Diet - low sodium heart healthy    Complete by:  As directed      Increase activity slowly    Complete by:  As directed             Medication List    STOP taking these medications        DSS 100 MG Caps     metoprolol tartrate 12.5 mg Tabs tablet  Commonly known as:  LOPRESSOR     triamterene-hydrochlorothiazide 37.5-25 MG per tablet  Commonly known as:  MAXZIDE-25      TAKE these medications        acetaminophen 325 MG tablet  Commonly known as:  TYLENOL  Take 2 tablets (650 mg total) by mouth every 6 (six) hours as needed for pain.     atorvastatin 40 MG tablet  Commonly known as:  LIPITOR  Take 40 mg by mouth daily.     calcium-vitamin D 500-200 MG-UNIT per tablet  Commonly known as:  OSCAL WITH D  Take 1 tablet by mouth daily.     cholecalciferol 1000 UNITS tablet  Commonly known as:  VITAMIN D  Take 1,000 Units by mouth daily.     hydrocortisone 2.5 % cream  Apply 1 application topically 2 (two) times daily as needed (rash).     levothyroxine 112 MCG tablet  Commonly known as:  SYNTHROID, LEVOTHROID  Take 112 mcg by mouth daily.     magnesium oxide 400 MG tablet  Commonly known as:  MAG-OX  Take 400 mg by mouth 2 (two) times daily.     metoprolol succinate 25 MG 24 hr tablet  Commonly known as:  TOPROL-XL  Take 25 mg by mouth daily.     ondansetron 4 MG tablet  Commonly known as:  ZOFRAN  Take 1 tablet (4 mg total) by mouth every 6 (six) hours as needed for nausea.     pantoprazole 40 MG tablet  Commonly known as:  PROTONIX  Take 40 mg by mouth daily.     sennosides-docusate sodium 8.6-50 MG tablet  Commonly known as:  SENOKOT-S  Take 1 tablet by mouth daily.           Follow-up Information    Follow up with Marylene Land, MD. Schedule an appointment as soon as possible for a visit in 2 weeks.   Specialty:  Family Medicine    Why:  Follow up appt after recent hospitalization   Contact information:   [place] [address] [phone]        The results of significant diagnostics from this hospitalization (including imaging, microbiology, ancillary and laboratory) are listed below for reference.    Significant Diagnostic Studies: Dg Hip Complete Right  09/02/2014   CLINICAL DATA:  [place] last night injuring RIGHT hip, RIGHT hip pain  EXAM: RIGHT HIP - COMPLETE 2+ VIEW  COMPARISON:  Pelvic radiograph 08/17/2012  FINDINGS: Osseous demineralization.  Hip and SI joints symmetric.  Prior ORIF proximal LEFT femur.  Nondisplaced fractures of the RIGHT superior and inferior pubic rami.  Femoral head normally located without fracture or dislocation.  Scattered atherosclerotic calcifications.  IMPRESSION: Osseous demineralization.  Nondisplaced RIGHT superior and inferior pubic rami fractures.  No proximal RIGHT femoral fracture identified.  Prior ORIF proximal LEFT femur.   Electronically Signed   By: Lavonia Dana M.D.   On: 09/02/2014 10:53   Ct Head Wo Contrast  09/02/2014   CLINICAL DATA:  Fall 1 day ago.  EXAM: CT HEAD WITHOUT CONTRAST  TECHNIQUE: Contiguous axial images were obtained from the base of the skull through the vertex without intravenous contrast.  COMPARISON:  08/17/2012  FINDINGS: Global atrophy. Chronic ischemic changes. No mass effect, midline shift, or hemorrhage. Mastoid air cells clear. Visualized paranasal sinuses clear.  IMPRESSION: No acute intracranial pathology.   Electronically Signed   By: Maryclare Bean M.D.   On: 09/02/2014 10:44   Ct Chest Wo Contrast  08/14/2014   CLINICAL DATA:  80 year old male with history of lung cancer diagnosed in 2008 and colon cancer in 1994 status post left upper lobectomy and colonic resection presenting with recent history of cough.  EXAM: CT CHEST WITHOUT CONTRAST  TECHNIQUE: Multidetector CT imaging of the chest was performed following the standard  protocol without IV contrast.  COMPARISON:  CT of the chest 08/14/2013.  FINDINGS: Mediastinum: Heart size is normal. There is no significant pericardial fluid, thickening or pericardial calcification. There is atherosclerosis of the thoracic aorta, the great vessels of the mediastinum and the coronary arteries, including calcified atherosclerotic plaque in the left main, left anterior descending, left circumflex and right coronary arteries. Severe calcifications of the mitral annulus. No pathologically enlarged mediastinal or hilar lymph nodes. Please note that accurate exclusion of hilar adenopathy is limited on noncontrast CT scans. Esophagus is unremarkable in appearance.  Lungs/Pleura: Status post left lower lobectomy. Compensatory hyperexpansion of the left upper lobe. Mild chronic scarring in the base of the left upper lobe. No suspicious appearing pulmonary nodules or masses. Some pleural calcifications in the base of the right hemithorax are incidentally noted and unchanged. No acute consolidative airspace disease. No pleural effusions.  Upper Abdomen: Atherosclerosis.  Otherwise, unremarkable.  Musculoskeletal: There are no aggressive appearing lytic or blastic lesions noted in the visualized portions of the skeleton. Multiple old healed lateral right-sided rib fractures incidentally noted.  IMPRESSION: 1. No findings to suggest locally recurrent disease or metastatic disease to the thorax. 2. Stable postoperative changes, as above. 3. Atherosclerosis, including left main and 3 vessel coronary artery disease. 4. Additional incidental findings, as above.   Electronically Signed   By: Vinnie Langton M.D.   On: 08/14/2014 15:50   Ct Hip Right Wo Contrast  09/02/2014   CLINICAL DATA:  Right hip pain after fall. Fracture. Initial encounter  EXAM: CT OF THE RIGHT HIP WITHOUT CONTRAST  TECHNIQUE: Multidetector CT imaging of the right hip was performed according to the standard protocol. Multiplanar CT image  reconstructions were also generated.  COMPARISON:  Plain films of the pelvis and right hip 09/02/2014.  FINDINGS: There nondisplaced fractures of the right superior and inferior pubic rami. The superior pubic ramus fracture is segmental. There is no right hip fracture. The hip is located. No hip joint effusion is appreciated. Imaged intrapelvic contents demonstrate no acute abnormality with atherosclerosis noted.  IMPRESSION: Nondisplaced right superior and inferior pubic rami fractures.  Negative for hip fracture.   Electronically Signed   By: Inge Rise M.D.   On: 09/02/2014 19:13   Dg Chest Port 1 View  09/02/2014  CLINICAL DATA:  80 year old male with weakness confusion and hypoxia. Initial encounter.  EXAM: PORTABLE CHEST - 1 VIEW  COMPARISON:  Chest CT 08/14/2014 and earlier. Including abdomen radiographs 04/15/2011.  FINDINGS: Portable AP upright view at 1329 hrs. Gas distended large bowel loops in the upper abdomen with chronic colonic interposition between the liver and the right hemidiaphragm. No definite pneumoperitoneum. Chronic elevation of the right hemidiaphragm, stable. Stable cardiac size and mediastinal contours. No pneumothorax or pulmonary edema. No pleural effusion or consolidation. Multilevel chronic right rib fractures.  IMPRESSION: 1.  No acute cardiopulmonary abnormality. 2. Abnormal increased lucency in the visible upper abdomen felt related to gas distended large bowel rather than pneumoperitoneum. If there is abdominal pain recommend dedicated abdominal series to include upright or left-side-down lateral decubitus views.   Electronically Signed   By: Lars Pinks M.D.   On: 09/02/2014 13:48    Microbiology: No results found for this or any previous visit (from the past 240 hour(s)).   Labs: Basic Metabolic Panel:  Recent Labs Lab 09/02/14 1003 09/03/14 0500 09/04/14 0710  NA 135 133* 134*  K 3.6 3.4* 3.6  CL 95* 96 96  CO2 32 34* 34*  GLUCOSE 122* 106* 109*  BUN  14 14 14   CREATININE 0.83 0.64 0.78  CALCIUM 9.0 8.2* 8.3*  MG  --  1.5  --    Liver Function Tests: No results for input(s): AST, ALT, ALKPHOS, BILITOT, PROT, ALBUMIN in the last 168 hours. No results for input(s): LIPASE, AMYLASE in the last 168 hours. No results for input(s): AMMONIA in the last 168 hours. CBC:  Recent Labs Lab 09/02/14 1003 09/03/14 0500  WBC 11.1* 9.3  HGB 13.8 12.6*  HCT 41.5 38.0*  MCV 89.6 90.0  PLT 198 158   Cardiac Enzymes: No results for input(s): CKTOTAL, CKMB, CKMBINDEX, TROPONINI in the last 168 hours. BNP: BNP (last 3 results) No results for input(s): PROBNP in the last 8760 hours. CBG: No results for input(s): GLUCAP in the last 168 hours.  Time coordinating discharge: Over 30 minutes

## 2014-09-04 NOTE — Progress Notes (Signed)
Report called to Misty,RN at [facility]. Patient ready for transport to facility.

## 2014-09-04 NOTE — Discharge Instructions (Signed)
Stable Pelvic Fracture °You have one or more fractures (this means there is a break in the bones) of the pelvis. The pelvis is the ring of bones that make up your hipbones. These are the bones you sit on and the lower part of the spine. It is like a boney ring where your legs attach and which supports your upper body. You have an undisplaced fracture. This means the bones are in good position. The pelvic fracture you have is a simple (uncomplicated) fracture. °DIAGNOSIS  °X-rays usually diagnose these fractures. °TREATMENT  °The goal of treating pelvic fractures is to get the bones to heal in a good position. The patient should return to normal activities as soon as possible. Such fractures are often treated with normal bed rest and conservative measures.  °HOME CARE INSTRUCTIONS  °· You should be on bed rest for as long as directed by your caregiver. Change positions of your legs every 1-2 hours to maintain good blood flow. You may sit as long as is tolerable. Following this, you may do usual activities, but avoid strenuous activities for as long as directed by your caregiver. °· Only take over-the-counter or prescription medicines for pain, discomfort, or fever as directed by your caregiver. °· Bed rest may also be used for discomfort. °· Resume your activities when you are able. Use a cane or crutch on the injured side to reduce pain while walking, as needed. °· If you develop increased pain or discomfort not relieved with medications, contact your caregiver. °· Warning: Do not drive a car or operate a motor vehicle until your caregiver specifically tells you it is safe to do so. °SEEK IMMEDIATE MEDICAL CARE IF:  °· You feel light-headed or faint, develop chest pain or shortness of breath. °· An unexplained oral temperature above 102° F (38.9° C) develops. °· You develop blood in the urine or in the stools. °· There is difficulty urinating, and/or having a bowel movement, or pain with these efforts. °· There is a  difficulty or increased pain with walking. °· There is swelling in one or both legs that is not normal. °Document Released: 11/02/2001 Document Revised: 01/08/2014 Document Reviewed: 04/06/2008 °ExitCare® Patient Information ©2015 [employer]. This information is not intended to replace advice given to you by your health care provider. Make sure you discuss any questions you have with your health care provider. ° °

## 2014-09-05 ENCOUNTER — Encounter: Payer: Self-pay | Admitting: Registered Nurse

## 2014-09-05 ENCOUNTER — Non-Acute Institutional Stay (SKILLED_NURSING_FACILITY): Payer: Medicare Other | Admitting: Registered Nurse

## 2014-09-05 DIAGNOSIS — I10 Essential (primary) hypertension: Secondary | ICD-10-CM

## 2014-09-05 DIAGNOSIS — E039 Hypothyroidism, unspecified: Secondary | ICD-10-CM

## 2014-09-05 DIAGNOSIS — Z85118 Personal history of other malignant neoplasm of bronchus and lung: Secondary | ICD-10-CM

## 2014-09-05 DIAGNOSIS — F0390 Unspecified dementia without behavioral disturbance: Secondary | ICD-10-CM

## 2014-09-05 DIAGNOSIS — E785 Hyperlipidemia, unspecified: Secondary | ICD-10-CM

## 2014-09-05 DIAGNOSIS — K219 Gastro-esophageal reflux disease without esophagitis: Secondary | ICD-10-CM

## 2014-09-05 DIAGNOSIS — S32591S Other specified fracture of right pubis, sequela: Secondary | ICD-10-CM

## 2014-09-05 DIAGNOSIS — R682 Dry mouth, unspecified: Secondary | ICD-10-CM

## 2014-09-05 DIAGNOSIS — K5900 Constipation, unspecified: Secondary | ICD-10-CM

## 2014-09-05 DIAGNOSIS — R1310 Dysphagia, unspecified: Secondary | ICD-10-CM

## 2014-09-05 DIAGNOSIS — S32501S Unspecified fracture of right pubis, sequela: Secondary | ICD-10-CM

## 2014-09-05 NOTE — Progress Notes (Signed)
Patient ID: [name], male   DOB: 10/26/1927, 80 y.o.   MRN: [id number]   Place of Service: [facility]  Allergies  Allergen Reactions  . Morphine And Related Other (See Comments)    hallucinations  . Codeine     Pt is allergic to pain medications including morphine     Code Status: Full Code  Goals of Care: Longevity/STR  Chief Complaint  Patient presents with  . Hospitalization Follow-up    HPI 80 y.o. male with PMH of dementia, colon ca, NSCLC, HTN, HLD among others is being seen for a follow-up visit post hospital admission from 09/02/14 to 09/04/14 for right pubic rami fracture after a mechanical fall. He is here for short term rehab and his goal is to return home. Seen in room today. Wife at bedside. No complaints verbalized by patient. Wife would like for patient to have tylenol prior to therapy.   Review of Systems (limited due to dementia-wife provided some ROS) Constitutional: Negative for fever and chills HENT: Negative for ear pain, congestion, and sore throat Eyes: Negative for eye pain Cardiovascular: Negative for chest pain. Positive for leg swelling (R>L, chronic) Respiratory: Negative cough, shortness of breath, and wheezing.  Gastrointestinal: Negative for nausea and vomiting. Negative for abdominal pain. Has diarrhea in hospital. Last BM was Sunday 09/02/14. Genitourinary: Negative for  dysuria Musculoskeletal: Negative for back pain. Positive for right hip pain with movement.  Neurological: Negative for dizziness and headache Skin: Negative for rash and wound.   Psychiatric: Negative for depression. Has dementia at baseline  Past Medical History  Diagnosis Date  . Hyperlipidemia   . Hypertension   . Allergy   . Fractured shoulder 8/30?    pt fell about 3 weeks ago and fractured shoulder blade   . Fracture, intertrochanteric, left femur 08/17/2012  . COPD (chronic obstructive pulmonary disease)   . colon ca dx'd 1994    chemo  .  Lung cancer dx'd 07/2007    lu-lobectomy    Past Surgical History  Procedure Laterality Date  . Colon surgery    . Colostomy takedown    . Colostomy revision    . Hernia repair    . Femur fracture surgery  08/17/2012  . Intramedullary (im) nail intertrochanteric  08/17/2012    Procedure: INTRAMEDULLARY (IM) NAIL INTERTROCHANTRIC;  Surgeon: Johnny Bridge, MD;  Location: [place];  Service: Orthopedics;  Laterality: Left;  Synthes GSN, Fracture Table, Big C arm    History  Substance Use Topics  . Smoking status: Never Smoker   . Smokeless tobacco: Never Used  . Alcohol Use: No    Family History  Problem Relation Age of Onset  . Stroke Mother   . Heart disease Brother       Medication List       This list is accurate as of: 09/05/14  4:13 PM.  Always use your most recent med list.               acetaminophen 325 MG tablet  Commonly known as:  TYLENOL  Take 2 tablets (650 mg total) by mouth every 6 (six) hours as needed for pain.     atorvastatin 40 MG tablet  Commonly known as:  LIPITOR  Take 40 mg by mouth daily.     calcium-vitamin D 500-200 MG-UNIT per tablet  Commonly known as:  OSCAL WITH D  Take 1 tablet by mouth daily.     cholecalciferol 1000 UNITS tablet  Commonly known as:  VITAMIN D  Take 1,000 Units by mouth daily.     hydrocortisone 2.5 % cream  Apply 1 application topically 2 (two) times daily as needed (rash).     levothyroxine 112 MCG tablet  Commonly known as:  SYNTHROID, LEVOTHROID  Take 112 mcg by mouth daily.     magnesium oxide 400 MG tablet  Commonly known as:  MAG-OX  Take 400 mg by mouth 2 (two) times daily.     metoprolol succinate 25 MG 24 hr tablet  Commonly known as:  TOPROL-XL  Take 25 mg by mouth daily.     ondansetron 4 MG tablet  Commonly known as:  ZOFRAN  Take 1 tablet (4 mg total) by mouth every 6 (six) hours as needed for nausea.     pantoprazole 40 MG tablet  Commonly known as:  PROTONIX  Take 40 mg by mouth  daily.     sennosides-docusate sodium 8.6-50 MG tablet  Commonly known as:  SENOKOT-S  Take 1 tablet by mouth daily.        Physical Exam  BP 141/80 mmHg  Pulse 93  Temp(Src) 98 F (36.7 C)  Resp 20  Ht 5\' 11"  (1.803 m)  Wt 211 lb (95.709 kg)  BMI 29.44 kg/m2  SpO2 92%  Constitutional: WDWN elderly male in no acute distress. Conversant and pleasantly confused HEENT: Normocephalic and atraumatic. PERRL. EOM intact. No icterus. Oral mucosa dry. Posterior pharynx clear of any exudate or lesions.  Neck: Supple and nontender. No lymphadenopathy, masses, or thyromegaly. No JVD or carotid bruits. Cardiac: Normal S1, S2. RRR without appreciable murmurs, rubs, or gallops. Distal pulses intact. Trace pitting edema of BLE, R>L Lungs: No respiratory distress. Breath sounds clear bilaterally without rales, rhonchi, or wheezes. Abdomen: Audible bowel sounds in all quadrants. Soft, nontender, nondistended. Midline surgical scar and RLQ scar from colostomy takedown noted on exam.  Musculoskeletal: able to move all extremities. Generalized weakness present. No joint erythema or tenderness.  Skin: Warm and dry. No rash noted. No erythema. Small bruise noted on right side of of head.  Neurological: Alert and oriented to self Psychiatric: Appropriate mood and affect.   Labs Reviewed  CBC Latest Ref Rng 09/03/2014 09/02/2014 08/14/2014  WBC 4.0 - 10.5 K/uL 9.3 11.1(H) 8.7  Hemoglobin 13.0 - 17.0 g/dL 12.6(L) 13.8 14.4  Hematocrit 39.0 - 52.0 % 38.0(L) 41.5 44.4  Platelets 150 - 400 K/uL 158 198 216    CMP Latest Ref Rng 09/04/2014 09/03/2014 09/02/2014  Glucose 70 - 99 mg/dL 109(H) 106(H) 122(H)  BUN 6 - 23 mg/dL 14 14 14   Creatinine 0.50 - 1.35 mg/dL 0.78 0.64 0.83  Sodium 135 - 145 mmol/L 134(L) 133(L) 135  Potassium 3.5 - 5.1 mmol/L 3.6 3.4(L) 3.6  Chloride 96 - 112 mEq/L 96 96 95(L)  CO2 19 - 32 mmol/L 34(H) 34(H) 32  Calcium 8.4 - 10.5 mg/dL 8.3(L) 8.2(L) 9.0  Total Protein 6.4 - 8.3  g/dL - - -  Total Bilirubin 0.20 - 1.20 mg/dL - - -  Alkaline Phos 40 - 150 U/L - - -  AST 5 - 34 U/L - - -  ALT 0 - 55 U/L - - -    Lab Results  Component Value Date   TSH 1.372 08/18/2012   Diagnostic Studies Reviewed 09/02/14: CT R hip: Nondisplaced right superior and inferior pubic rami fractures. Negative for hip fracture  09/02/14: Head CT: No acute intracranial pathology  Assessment & Plan 1. Essential  hypertension Continue toprol 25mg  daily and monitor  2. Hypothyroidism, unspecified hypothyroidism type Continue levothyroxine 156mcg daily   3. Fracture of multiple pubic rami, right, sequela Pain is adequately controlled with current regimen. Has not had anything for pain for admission. Will have staff give patient tylenol 30 minutes prior to therapy. Continue tylenol 650mg  every six hours as needed for pain. Continue to work PT/OT for gait/strength/balance training to restore/maximize functional capacity. Fall risk precautions  4. HLD (hyperlipidemia) Continue lipitor 40mg  daily and monitor  5. Dementia, without behavioral disturbance Not on medications for dementia. Continue to monitor for change in behaviors. Fall risk precautions  6. Hx of cancer of lung Per wife, patient gets annual chest CT. Followed by oncology, Dr. Earlie Server.  7. Constipation, unspecified constipation type Continue sennakot-s daily and monitor. Encourage hydration and mobility as tolerated.   8. Dry mouth Encourage hydration.   9. Possible dysphagia Wife stated patient had trouble swallowing solid/hard foods. During visit, patient was coughing hard after drinking some water through a straw. Will have SPL eval swallowing function. Aspiration precautions. Continue to monitor.   10. GERD Continue protonix 40mg  daily  Time spent: 40 minutes on care coordination  Family/Staff Communication Plan of care discussed with wife and nursing staff. Wife and nursing staff verbalized understanding and  agree with plan of care. No additional questions or concerns reported.    Arthur Holms, MSN, AGNP-C [facility] [address] [place], [place] [address] [phone] [8am-5pm] After hours: [phone]

## 2014-09-06 ENCOUNTER — Non-Acute Institutional Stay (SKILLED_NURSING_FACILITY): Payer: Medicare Other | Admitting: Internal Medicine

## 2014-09-06 DIAGNOSIS — R5381 Other malaise: Secondary | ICD-10-CM

## 2014-09-06 DIAGNOSIS — I10 Essential (primary) hypertension: Secondary | ICD-10-CM

## 2014-09-06 DIAGNOSIS — S32509K Unspecified fracture of unspecified pubis, subsequent encounter for fracture with nonunion: Secondary | ICD-10-CM

## 2014-09-06 DIAGNOSIS — E785 Hyperlipidemia, unspecified: Secondary | ICD-10-CM

## 2014-09-06 DIAGNOSIS — C3412 Malignant neoplasm of upper lobe, left bronchus or lung: Secondary | ICD-10-CM

## 2014-09-06 DIAGNOSIS — F0280 Dementia in other diseases classified elsewhere without behavioral disturbance: Secondary | ICD-10-CM

## 2014-09-06 DIAGNOSIS — S32599K Other specified fracture of unspecified pubis, subsequent encounter for fracture with nonunion: Secondary | ICD-10-CM

## 2014-09-06 DIAGNOSIS — G3183 Dementia with Lewy bodies: Secondary | ICD-10-CM

## 2014-09-06 DIAGNOSIS — K5900 Constipation, unspecified: Secondary | ICD-10-CM

## 2014-09-06 DIAGNOSIS — E039 Hypothyroidism, unspecified: Secondary | ICD-10-CM

## 2014-09-06 NOTE — Progress Notes (Signed)
   09/03/14 1207  OT G-codes **NOT FOR INPATIENT CLASS**  Functional Assessment Tool Used clinical judgement  Functional Limitation Self care  Self Care Current Status [id number]) CM  Self Care Goal Status (S1282) CJ  late entry for 09/03/14 Pauline Aus OTR/L [phone] 09/06/2014

## 2014-09-06 NOTE — Progress Notes (Signed)
Patient ID: [name], male   DOB: 1928/07/19, 80 y.o.   MRN: [id number]     Facility: [facility]    PCP: Marylene Land, MD  Code Status: full code  Allergies  Allergen Reactions  . Morphine And Related Other (See Comments)    hallucinations  . Codeine     Pt is allergic to pain medications including morphine     Chief Complaint  Patient presents with  . New Admit To SNF     HPI:  80 y.o. male patient is here for STR post hospital admission from 09/02/14 to 09/04/14 for right pubic rami fracture after a mechanical fall. Conservative management was decided upon with pain control and therapy. He has PMH of dementia, [place] lung cancer, HTN, HLD among others. He is seen with therapist and his wife present. His dementia limits his history taking and ROS. He is alert and in no distress. His wife has noticed his tremor to be more prominent. As per staff he has involuntary jerks at times.    Review of Systems: from wife and therapist Constitutional: Negative for fever, chills, diaphoresis.  HENT: Negative for congestion  Respiratory: positive for cough. Negative for shortness of breath and wheezing.   Cardiovascular: Negative for chest pain Gastrointestinal: Negative for heartburn, nausea, vomiting, abdominal pain. No bowel movement since 09/02/14 Genitourinary: Negative for dysuria  Musculoskeletal: Negative for back pain.  Skin: Negative for itching, rash.  Neurological: Negative for headaches.  Psychiatric/Behavioral: positive for memory loss.    Past Medical History  Diagnosis Date  . Hyperlipidemia   . Hypertension   . Allergy   . Fractured shoulder 8/30?    pt fell about 3 weeks ago and fractured shoulder blade   . Fracture, intertrochanteric, left femur 08/17/2012  . COPD (chronic obstructive pulmonary disease)   . colon ca dx'd 1994    chemo  . Lung cancer dx'd 07/2007    lu-lobectomy   Past Surgical History  Procedure  Laterality Date  . Colon surgery    . Colostomy takedown    . Colostomy revision    . Hernia repair    . Femur fracture surgery  08/17/2012  . Intramedullary (im) nail intertrochanteric  08/17/2012    Procedure: INTRAMEDULLARY (IM) NAIL INTERTROCHANTRIC;  Surgeon: Johnny Bridge, MD;  Location: [place];  Service: Orthopedics;  Laterality: Left;  Synthes GSN, Fracture Table, Big C arm   Social History:   reports that he has never smoked. He has never used smokeless tobacco. He reports that he does not drink alcohol or use illicit drugs.  Family History  Problem Relation Age of Onset  . Stroke Mother   . Heart disease Brother     Medications: Patient's Medications  New Prescriptions   No medications on file  Previous Medications   ACETAMINOPHEN (TYLENOL) 325 MG TABLET    Take 2 tablets (650 mg total) by mouth every 6 (six) hours as needed for pain.   ATORVASTATIN (LIPITOR) 40 MG TABLET    Take 40 mg by mouth daily.     CALCIUM-VITAMIN D (OSCAL WITH D) 500-200 MG-UNIT PER TABLET    Take 1 tablet by mouth daily.   CHOLECALCIFEROL (VITAMIN D) 1000 UNITS TABLET    Take 1,000 Units by mouth daily.   HYDROCORTISONE 2.5 % CREAM    Apply 1 application topically 2 (two) times daily as needed (rash).    LEVOTHYROXINE (SYNTHROID, LEVOTHROID) 112 MCG TABLET    Take 112  mcg by mouth daily.   MAGNESIUM OXIDE (MAG-OX) 400 MG TABLET    Take 400 mg by mouth 2 (two) times daily.   METOPROLOL SUCCINATE (TOPROL-XL) 25 MG 24 HR TABLET    Take 25 mg by mouth daily.    ONDANSETRON (ZOFRAN) 4 MG TABLET    Take 1 tablet (4 mg total) by mouth every 6 (six) hours as needed for nausea.   PANTOPRAZOLE (PROTONIX) 40 MG TABLET    Take 40 mg by mouth daily.    SENNOSIDES-DOCUSATE SODIUM (SENOKOT-S) 8.6-50 MG TABLET    Take 1 tablet by mouth daily.  Modified Medications   No medications on file  Discontinued Medications   No medications on file     Physical Exam: Filed Vitals:   09/06/14 1056  BP: 140/74    Pulse: 78  Temp: 97.3 F (36.3 C)  Resp: 18  SpO2: 96%    General- elderly male in no acute distress Head- atraumatic, normocephalic Eyes- PERRLA, EOMI, no pallor, no icterus, no discharge Neck- no cervical lymphadenopathy Throat- moist mucus membrane Cardiovascular- normal s1,s2, no murmurs, palpable dorsalis pedis Respiratory- bilateral clear to auscultation, no wheeze, no rhonchi, no crackles, no use of accessory muscles Abdomen- bowel sounds present, soft, non tender Musculoskeletal- able to move all 4 extremities, generalized weakness, trace edema in legs right > left  Neurological- alert and oriented to self, rigidity noted in right UE with involuntary jerks, resting tremors in his hands noted, on wheelchair and is WBAT Skin- warm and dry Psychiatry- normal mood and affect    Labs reviewed: Basic Metabolic Panel:  Recent Labs  09/02/14 1003 09/03/14 0500 09/04/14 0710  NA 135 133* 134*  K 3.6 3.4* 3.6  CL 95* 96 96  CO2 32 34* 34*  GLUCOSE 122* 106* 109*  BUN 14 14 14   CREATININE 0.83 0.64 0.78  CALCIUM 9.0 8.2* 8.3*  MG  --  1.5  --    Liver Function Tests:  Recent Labs  08/14/14 1121  AST 14  ALT 13  ALKPHOS 75  BILITOT 0.55  PROT 6.8  ALBUMIN 3.5   No results for input(s): LIPASE, AMYLASE in the last 8760 hours. No results for input(s): AMMONIA in the last 8760 hours. CBC:  Recent Labs  08/14/14 1121 09/02/14 1003 09/03/14 0500  WBC 8.7 11.1* 9.3  NEUTROABS 6.2  --   --   HGB 14.4 13.8 12.6*  HCT 44.4 41.5 38.0*  MCV 91.2 89.6 90.0  PLT 216 198 158   Radiological Exams:  09/02/14: CT R hip: Nondisplaced right superior and inferior pubic rami fractures. Negative for hip fracture  09/02/14: Head CT: No acute intracranial pathology   Assessment/plan  Physical deconditioning Will have patient work with PT/OT as tolerated to regain strength and restore function.  Fall precautions are in place. To provide assistance with ADLs.    Pubic rami fracture Under conservative management. Continue tylenol q6h prn for pain ad to work with therapy team. Fall precautions  Dementia  No behavioral disturbance. Monitor clinically, to provide assistance with his ADLs. Monitor po intake. Given his muscle rigidity and tremor, concerns for parkinson's feature. Not on any psych meds contributing to EPS. Will have him on cogentin 1 mg daily at bedtime to help with the rigidity and tremor. reassess  Constipation Has not had bowel movement for almost a week now. Add colace 100 mg bid and miralax daily for now with sennakot s and can change miralax to daily prn once bowel  movement resumes  Essential hypertension Continue toprol 25mg  daily and monitor bp readings  Hypothyroidism Continue levothyroxine 128mcg daily   HLD  Continue lipitor 40mg  daily  Hx of cancer of lung Per wife, patient gets annual chest CT. Followed by oncology, Dr. Earlie Server.  GERD Continue protonix 40mg  daily   Goals of care: short term rehabilitation    Labs/tests ordered: cbc with diff, cmp next week    Blanchie Serve, MD  [place] (Monday-Friday 8 am - 5 pm) [phone] (afterhours)

## 2014-09-24 ENCOUNTER — Non-Acute Institutional Stay (SKILLED_NURSING_FACILITY): Payer: Medicare Other | Admitting: Registered Nurse

## 2014-09-24 ENCOUNTER — Encounter (HOSPITAL_COMMUNITY): Payer: Self-pay | Admitting: Nurse Practitioner

## 2014-09-24 ENCOUNTER — Emergency Department (HOSPITAL_COMMUNITY)
Admission: EM | Admit: 2014-09-24 | Discharge: 2014-09-24 | Disposition: A | Discharge status: Home / Self Care | Payer: Medicare Other | Attending: Emergency Medicine | Admitting: Emergency Medicine

## 2014-09-24 ENCOUNTER — Emergency Department (HOSPITAL_COMMUNITY): Payer: Medicare Other

## 2014-09-24 DIAGNOSIS — Z85038 Personal history of other malignant neoplasm of large intestine: Secondary | ICD-10-CM | POA: Insufficient documentation

## 2014-09-24 DIAGNOSIS — R531 Weakness: Secondary | ICD-10-CM

## 2014-09-24 DIAGNOSIS — Z85118 Personal history of other malignant neoplasm of bronchus and lung: Secondary | ICD-10-CM | POA: Insufficient documentation

## 2014-09-24 DIAGNOSIS — J449 Chronic obstructive pulmonary disease, unspecified: Secondary | ICD-10-CM | POA: Insufficient documentation

## 2014-09-24 DIAGNOSIS — E785 Hyperlipidemia, unspecified: Secondary | ICD-10-CM | POA: Insufficient documentation

## 2014-09-24 DIAGNOSIS — M6281 Muscle weakness (generalized): Secondary | ICD-10-CM | POA: Insufficient documentation

## 2014-09-24 DIAGNOSIS — R29898 Other symptoms and signs involving the musculoskeletal system: Secondary | ICD-10-CM

## 2014-09-24 DIAGNOSIS — M6289 Other specified disorders of muscle: Secondary | ICD-10-CM

## 2014-09-24 DIAGNOSIS — Z8781 Personal history of (healed) traumatic fracture: Secondary | ICD-10-CM | POA: Insufficient documentation

## 2014-09-24 DIAGNOSIS — I10 Essential (primary) hypertension: Secondary | ICD-10-CM | POA: Diagnosis not present

## 2014-09-24 DIAGNOSIS — R29810 Facial weakness: Secondary | ICD-10-CM

## 2014-09-24 DIAGNOSIS — Z79899 Other long term (current) drug therapy: Secondary | ICD-10-CM | POA: Insufficient documentation

## 2014-09-24 DIAGNOSIS — F0390 Unspecified dementia without behavioral disturbance: Secondary | ICD-10-CM | POA: Insufficient documentation

## 2014-09-24 LAB — COMPREHENSIVE METABOLIC PANEL
ALT: 14 U/L (ref 0–53)
ANION GAP: 9 (ref 5–15)
AST: 18 U/L (ref 0–37)
Albumin: 3.2 g/dL — ABNORMAL LOW (ref 3.5–5.2)
Alkaline Phosphatase: 183 U/L — ABNORMAL HIGH (ref 39–117)
BUN: 10 mg/dL (ref 6–23)
CO2: 32 mmol/L (ref 19–32)
CREATININE: 0.8 mg/dL (ref 0.50–1.35)
Calcium: 9.2 mg/dL (ref 8.4–10.5)
Chloride: 97 mEq/L (ref 96–112)
GFR calc Af Amer: >90 mL/min (ref >90)
GFR calc non Af Amer: 80 mL/min — ABNORMAL LOW (ref >90)
Glucose, Bld: 106 mg/dL — ABNORMAL HIGH (ref 70–99)
Potassium: 4.4 mmol/L (ref 3.5–5.1)
Sodium: 138 mmol/L (ref 135–145)
Total Bilirubin: 0.6 mg/dL (ref 0.3–1.2)
Total Protein: 6.7 g/dL (ref 6.0–8.3)

## 2014-09-24 LAB — CBC WITH DIFFERENTIAL/PLATELET
Basophils Absolute: 0 10*3/uL (ref 0.0–0.1)
Basophils Relative: 1 % (ref 0–1)
Eosinophils Absolute: 0.1 10*3/uL (ref 0.0–0.7)
Eosinophils Relative: 1 % (ref 0–5)
HCT: 41.6 % (ref 39.0–52.0)
Hemoglobin: 13.6 g/dL (ref 13.0–17.0)
LYMPHS PCT: 16 % (ref 12–46)
Lymphs Abs: 1 10*3/uL (ref 0.7–4.0)
MCH: 29.4 pg (ref 26.0–34.0)
MCHC: 32.7 g/dL (ref 30.0–36.0)
MCV: 89.8 fL (ref 78.0–100.0)
MONOS PCT: 7 % (ref 3–12)
Monocytes Absolute: 0.4 10*3/uL (ref 0.1–1.0)
NEUTROS ABS: 4.7 10*3/uL (ref 1.7–7.7)
Neutrophils Relative %: 75 % (ref 43–77)
Platelets: 255 10*3/uL (ref 150–400)
RBC: 4.63 MIL/uL (ref 4.22–5.81)
RDW: 14.4 % (ref 11.5–15.5)
WBC: 6.2 10*3/uL (ref 4.0–10.5)

## 2014-09-24 LAB — PROTIME-INR
INR: 1.08 (ref 0.00–1.49)
PROTHROMBIN TIME: 14.1 s (ref 11.6–15.2)

## 2014-09-24 NOTE — ED Notes (Signed)
Patient dressed and moved to the hallways

## 2014-09-24 NOTE — ED Provider Notes (Signed)
CSN: [id number]     Arrival date & time 09/24/14  1224 History   First MD Initiated Contact with Patient 09/24/14 1229     Chief Complaint  Patient presents with  . Stroke Symptoms     (Consider location/radiation/quality/duration/timing/severity/associated sxs/prior Treatment) HPI Comments: Patient is an 81 year old male with history of dementia with associated tremor. He was sent from a rehabilitation facility for further evaluation of right arm weakness, right facial droop, and difficulty ambulating. He apparently fell 3 weeks ago and fractured his pelvis and bruised his right elbow and has been in the rehabilitation facility since this time. This morning, they felt that he had a right facial droop and was not moving his arm and sent him for evaluation of what they felt were strokelike symptoms.   Although the rehabilitation facility feels as though this arm weakness is new, the wife does not feel as though he is significantly changed from his baseline. The wife does report that he had more difficulty ambulating this morning than normal.  Patient has little to the history due to his dementia.    Patient is a 81 y.o. male presenting with weakness. The history is provided by the patient.  Weakness This is a new problem. Episode onset: Unknown. The problem occurs constantly. The problem has not changed since onset.Pertinent negatives include no chest pain, no headaches and no shortness of breath. Nothing aggravates the symptoms. Nothing relieves the symptoms. He has tried nothing for the symptoms. The treatment provided no relief.    Past Medical History  Diagnosis Date  . Hyperlipidemia   . Hypertension   . Allergy   . Fractured shoulder 8/30?    pt fell about 3 weeks ago and fractured shoulder blade   . Fracture, intertrochanteric, left femur 08/17/2012  . COPD (chronic obstructive pulmonary disease)   . colon ca dx'd 1994    chemo  . Lung cancer dx'd 07/2007    lu-lobectomy    Past Surgical History  Procedure Laterality Date  . Colon surgery    . Colostomy takedown    . Colostomy revision    . Hernia repair    . Femur fracture surgery  08/17/2012  . Intramedullary (im) nail intertrochanteric  08/17/2012    Procedure: INTRAMEDULLARY (IM) NAIL INTERTROCHANTRIC;  Surgeon: Johnny Bridge, MD;  Location: [place];  Service: Orthopedics;  Laterality: Left;  Synthes GSN, Fracture Table, Big C arm   Family History  Problem Relation Age of Onset  . Stroke Mother   . Heart disease Brother    History  Substance Use Topics  . Smoking status: Never Smoker   . Smokeless tobacco: Never Used  . Alcohol Use: No    Review of Systems  Respiratory: Negative for shortness of breath.   Cardiovascular: Negative for chest pain.  Neurological: Positive for weakness. Negative for headaches.      Allergies  Morphine and related and Codeine  Home Medications   Prior to Admission medications   Medication Sig Start Date End Date Taking? Authorizing Provider  acetaminophen (TYLENOL) 325 MG tablet Take 2 tablets (650 mg total) by mouth every 6 (six) hours as needed for pain. Patient taking differently: Take 325-650 mg by mouth every 6 (six) hours as needed for pain.  08/17/12   Johnny Bridge, MD  atorvastatin (LIPITOR) 40 MG tablet Take 40 mg by mouth daily.      Historical Provider, MD  calcium-vitamin D (OSCAL WITH D) 500-200 MG-UNIT per tablet Take 1 tablet  by mouth daily. Patient not taking: Reported on 09/02/2014 08/17/12   Johnny Bridge, MD  cholecalciferol (VITAMIN D) 1000 UNITS tablet Take 1,000 Units by mouth daily.    Historical Provider, MD  hydrocortisone 2.5 % cream Apply 1 application topically 2 (two) times daily as needed (rash).  08/04/14   Historical Provider, MD  levothyroxine (SYNTHROID, LEVOTHROID) 112 MCG tablet Take 112 mcg by mouth daily.    Historical Provider, MD  magnesium oxide (MAG-OX) 400 MG tablet Take 400 mg by mouth 2 (two) times daily.     Historical Provider, MD  metoprolol succinate (TOPROL-XL) 25 MG 24 hr tablet Take 25 mg by mouth daily.  08/16/14   Historical Provider, MD  ondansetron (ZOFRAN) 4 MG tablet Take 1 tablet (4 mg total) by mouth every 6 (six) hours as needed for nausea. 09/04/14   Robbie Lis, MD  pantoprazole (PROTONIX) 40 MG tablet Take 40 mg by mouth daily.  08/23/14   Historical Provider, MD  sennosides-docusate sodium (SENOKOT-S) 8.6-50 MG tablet Take 1 tablet by mouth daily. Patient not taking: Reported on 08/15/2014 08/17/12   Johnny Bridge, MD   BP 156/63 mmHg  Pulse 76  Temp(Src) 97.6 F (36.4 C) (Oral)  Resp 19  SpO2 97% Physical Exam  ED Course  Procedures (including critical care time) Labs Review Labs Reviewed  COMPREHENSIVE METABOLIC PANEL  CBC WITH DIFFERENTIAL  PROTIME-INR    Imaging Review No results found.   EKG Interpretation   Date/Time:  Monday September 24 2014 12:47:50 EST Ventricular Rate:  73 PR Interval:  197 QRS Duration: 97 QT Interval:  409 QTC Calculation: 451 R Axis:   0 Text Interpretation:  Sinus rhythm Abnormal R-wave progression, early  transition Left ventricular hypertrophy Confirmed by Beau Fanny  MD, Littie Chiem  ([id number]) on 09/24/2014 1:42:40 PM      MDM   Final diagnoses:  Right arm weakness    Patient brought from a rehabilitation facility for evaluation of right-sided weakness. To my physical exam, I appreciate little focality. His MRI is negative for stroke and laboratory studies are otherwise unremarkable. He appears in no acute distress. Nursing staff attempted to ambulate him, however he refused to do this. He stood up on the side of the bed then laid back down, using his leg with little limitation. There is no evidence for trauma or other abnormality. At this point I feel as though he is appropriate for discharge.     Veryl Speak, MD 09/24/14 [phone]

## 2014-09-24 NOTE — Progress Notes (Signed)
Patient ID: [name], male   DOB: [birth year], 81 y.o.   MRN: [id number]   Place of Service: [facility]  Allergies  Allergen Reactions  . Morphine And Related Other (See Comments)    hallucinations  . Codeine     Pt is allergic to pain medications including morphine     Code Status: Full Code  Goals of Care: Longevity/STR  Chief Complaint  Patient presents with  . Acute Visit    stroke-like symptoms     HPI 81 y.o. male with PMH of dementia, colon ca, NSCLC, HTN, HLD, right pubic rami fracture s/p fall among others is being seen for an acute visit at the request of therapy team for acute onsite left facial droop and right sided weakness. Per therapy, patient has been making decent progress in rehab. However, he was noticed to be significantly weaker on his right side and unable to stand as well as left facial droop this morning.  Seen in room today, unable to participate in HPI and ROS.   Review of Systems  Unable to obtain due to dementia but appears in no acute distress.   Past Medical History  Diagnosis Date  . Hyperlipidemia   . Hypertension   . Allergy   . Fractured shoulder 8/30?    pt fell about 3 weeks ago and fractured shoulder blade   . Fracture, intertrochanteric, left femur 08/17/2012  . COPD (chronic obstructive pulmonary disease)   . colon ca dx'd 1994    chemo  . Lung cancer dx'd 07/2007    lu-lobectomy    Past Surgical History  Procedure Laterality Date  . Colon surgery    . Colostomy takedown    . Colostomy revision    . Hernia repair    . Femur fracture surgery  08/17/2012  . Intramedullary (im) nail intertrochanteric  08/17/2012    Procedure: INTRAMEDULLARY (IM) NAIL INTERTROCHANTRIC;  Surgeon: Johnny Bridge, MD;  Location: [place];  Service: Orthopedics;  Laterality: Left;  Synthes GSN, Fracture Table, Big C arm    History  Substance Use Topics  . Smoking status: Never Smoker   . Smokeless tobacco: Never Used  . Alcohol  Use: No    Family History  Problem Relation Age of Onset  . Stroke Mother   . Heart disease Brother       Medication List       This list is accurate as of: 09/24/14  9:20 PM.  Always use your most recent med list.               acetaminophen 325 MG tablet  Commonly known as:  TYLENOL  Take 2 tablets (650 mg total) by mouth every 6 (six) hours as needed for pain.     atorvastatin 40 MG tablet  Commonly known as:  LIPITOR  Take 40 mg by mouth daily.     benztropine 1 MG tablet  Commonly known as:  COGENTIN  Take 1 mg by mouth at bedtime.     calcium-vitamin D 500-200 MG-UNIT per tablet  Commonly known as:  OSCAL WITH D  Take 1 tablet by mouth daily.     cholecalciferol 1000 UNITS tablet  Commonly known as:  VITAMIN D  Take 1,000 Units by mouth daily.     docusate sodium 100 MG capsule  Commonly known as:  COLACE  Take 100 mg by mouth 2 (two) times daily.     hydrocortisone 2.5 % cream  Apply 1  application topically 2 (two) times daily as needed (rash).     levothyroxine 112 MCG tablet  Commonly known as:  SYNTHROID, LEVOTHROID  Take 112 mcg by mouth daily.     magnesium oxide 400 MG tablet  Commonly known as:  MAG-OX  Take 400 mg by mouth 2 (two) times daily.     metoprolol succinate 25 MG 24 hr tablet  Commonly known as:  TOPROL-XL  Take 25 mg by mouth daily.     ondansetron 4 MG tablet  Commonly known as:  ZOFRAN  Take 1 tablet (4 mg total) by mouth every 6 (six) hours as needed for nausea.     pantoprazole 40 MG tablet  Commonly known as:  PROTONIX  Take 40 mg by mouth daily.     polyethylene glycol packet  Commonly known as:  MIRALAX / GLYCOLAX  Take 17 g by mouth daily.     sennosides-docusate sodium 8.6-50 MG tablet  Commonly known as:  SENOKOT-S  Take 1 tablet by mouth daily.        Physical Exam  BP 148/72 mmHg  Pulse 79  Temp(Src) 97.1 F (36.2 C)  Resp 20  SpO2 97%  Constitutional: WDWN elderly male in no acute distress.  Conversant and pleasantly confused HEENT: Normocephalic and atraumatic. PERRL. EOM intact. No icterus. Oral mucosa dry. Posterior pharynx clear of any exudate or lesions.  Neck: Supple and nontender. No lymphadenopathy, masses, or thyromegaly. No JVD or carotid bruits. Cardiac: Normal S1, S2. RRR without appreciable murmurs, rubs, or gallops. Distal pulses intact. Trace pitting edema of BLE Lungs: No respiratory distress. Breath sounds clear bilaterally without rales, rhonchi, or wheezes. Abdomen: Audible bowel sounds in all quadrants. Soft, nontender, nondistended. Midline surgical scar and RLQ scar from colostomy takedown noted on exam.  Musculoskeletal: able to move all extremities but with limited ROM. Generalized weakness R>L. Unable to elevated R arm. Has to use left hand to lift right arm . Weak hand grip bilaterally. No joint erythema or tenderness.  Skin: Warm and dry. No rash noted. No erythema. Small bruise noted on right side of of head.  Neurological: Alert  Psychiatric: Appropriate mood and affect.   Labs Reviewed  CBC Latest Ref Rng 09/24/2014 09/03/2014 09/02/2014  WBC 4.0 - 10.5 K/uL 6.2 9.3 11.1(H)  Hemoglobin 13.0 - 17.0 g/dL 13.6 12.6(L) 13.8  Hematocrit 39.0 - 52.0 % 41.6 38.0(L) 41.5  Platelets 150 - 400 K/uL 255 158 198    CMP Latest Ref Rng 09/24/2014 09/04/2014 09/03/2014  Glucose 70 - 99 mg/dL 106(H) 109(H) 106(H)  BUN 6 - 23 mg/dL 10 14 14   Creatinine 0.50 - 1.35 mg/dL 0.80 0.78 0.64  Sodium 135 - 145 mmol/L 138 134(L) 133(L)  Potassium 3.5 - 5.1 mmol/L 4.4 3.6 3.4(L)  Chloride 96 - 112 mEq/L 97 96 96  CO2 19 - 32 mmol/L 32 34(H) 34(H)  Calcium 8.4 - 10.5 mg/dL 9.2 8.3(L) 8.2(L)  Total Protein 6.0 - 8.3 g/dL 6.7 - -  Total Bilirubin 0.3 - 1.2 mg/dL 0.6 - -  Alkaline Phos 39 - 117 U/L 183(H) - -  AST 0 - 37 U/L 18 - -  ALT 0 - 53 U/L 14 - -    Lab Results  Component Value Date   TSH 1.372 08/18/2012   Diagnostic Studies Reviewed 09/02/14: CT R hip:  Nondisplaced right superior and inferior pubic rami fractures. Negative for hip fracture  09/02/14: Head CT: No acute intracranial pathology  Assessment & Plan 1.  Facial droop 2. Acute right-sided weakness Will send patient to ER for further evaluation/rule out acute cva.    Family/Staff Communication Plan of care discussed with nursing staff. Nursing staff verbalized understanding and agree with plan of care. No additional questions or concerns reported.    Arthur Holms, MSN, AGNP-C [facility] [address] [place], [place] [address] [phone] [8am-5pm] After hours: [phone]

## 2014-09-24 NOTE — ED Notes (Signed)
[facility] contacted to tx patient back to [facility]

## 2014-09-24 NOTE — ED Notes (Signed)
Family sts cannot appreciate facial droop and patients right arm and side has been weak since fall last year. Sts the only difference was patient unable to stand with walker. Family sts pt. Mentation at baseline.

## 2014-09-24 NOTE — ED Notes (Signed)
Per EMS pt from [facility] there for rehab for hip fracture. Staff reports today at unknown time noticed left side facial droop and weak right hand grip. For EMS when smiling pt has no facial droop and equal hand grips. Pt has advanced dementia and has some difficulty with following commands. Pt.s wife sts to EMS that patient appears at baseline.

## 2014-09-24 NOTE — Discharge Instructions (Signed)
Return to the emergency department if your symptoms substantially worsen or change.

## 2014-09-24 NOTE — ED Notes (Addendum)
Attempted to ambulate patient.  Patient able to sit up on side of bed and stand momentarily with walker and nurse and nurse tech on each side. Pt. Immediately sat down after standing and refusing to stand again. RN asked pt if due to pain or weakness and patient repeating RN stating pain & weakness. Pt not grimacing- no tenderness or bruising noted to leg with deep palpation. Pt lifted both legs and put back on bed.   Dr. Stark Jock made aware.

## 2014-09-24 NOTE — ED Notes (Signed)
Spoke to [facility] L[facility] from [facility] about patients condition sts therapy was working with patient today and pt was not using right arm as much and had difficulty standing and unable to walk with walker. Pt. Baseline per staff is that pt can use right and left arm the same and can walk with walker.   LSN was last therapy session at 09/23/2014 8AM

## 2014-10-01 ENCOUNTER — Non-Acute Institutional Stay (SKILLED_NURSING_FACILITY): Payer: Medicare Other | Admitting: Registered Nurse

## 2014-10-01 ENCOUNTER — Encounter: Payer: Self-pay | Admitting: Registered Nurse

## 2014-10-01 DIAGNOSIS — S32501S Unspecified fracture of right pubis, sequela: Secondary | ICD-10-CM

## 2014-10-01 DIAGNOSIS — E785 Hyperlipidemia, unspecified: Secondary | ICD-10-CM

## 2014-10-01 DIAGNOSIS — R5381 Other malaise: Secondary | ICD-10-CM

## 2014-10-01 DIAGNOSIS — I10 Essential (primary) hypertension: Secondary | ICD-10-CM

## 2014-10-01 DIAGNOSIS — E039 Hypothyroidism, unspecified: Secondary | ICD-10-CM

## 2014-10-01 DIAGNOSIS — F0280 Dementia in other diseases classified elsewhere without behavioral disturbance: Secondary | ICD-10-CM

## 2014-10-01 DIAGNOSIS — G3183 Dementia with Lewy bodies: Secondary | ICD-10-CM

## 2014-10-01 DIAGNOSIS — S32591S Other specified fracture of right pubis, sequela: Secondary | ICD-10-CM

## 2014-10-01 DIAGNOSIS — K5900 Constipation, unspecified: Secondary | ICD-10-CM

## 2014-10-01 DIAGNOSIS — R1310 Dysphagia, unspecified: Secondary | ICD-10-CM

## 2014-10-01 DIAGNOSIS — Z85118 Personal history of other malignant neoplasm of bronchus and lung: Secondary | ICD-10-CM

## 2014-10-01 NOTE — Progress Notes (Signed)
Patient ID: [name], male   DOB: Jan 15, 1928, 80 y.o.   MRN: [id number]   Place of Service: [facility]  Allergies  Allergen Reactions  . Morphine And Related Other (See Comments)    hallucinations  . Codeine     Pt is allergic to pain medications including morphine     Code Status: Full Code  Goals of Care: Longevity/STR  Chief Complaint  Patient presents with  . Discharge Note    HPI 81 y.o. male with PMH of dementia, colon ca, NSCLC, HTN, HLD among others is being seen for a discharge visit. He was here for short-term rehabilitation post hospital admission from 09/02/14 to 09/04/14 for right pubic rami fracture after a mechanical fall. He has worked well with therapy team and is ready to be discharged home with [facility] PT/OT/ST and no DME. Seen in room today. Unable to participate in hpi and ros but able to follow simple commands.   Review of Systems  Unable to obtain due to dementia  Past Medical History  Diagnosis Date  . Hyperlipidemia   . Hypertension   . Allergy   . Fractured shoulder 8/30?    pt fell about 3 weeks ago and fractured shoulder blade   . Fracture, intertrochanteric, left femur 08/17/2012  . COPD (chronic obstructive pulmonary disease)   . colon ca dx'd 1994    chemo  . Lung cancer dx'd 07/2007    lu-lobectomy    Past Surgical History  Procedure Laterality Date  . Colon surgery    . Colostomy takedown    . Colostomy revision    . Hernia repair    . Femur fracture surgery  08/17/2012  . Intramedullary (im) nail intertrochanteric  08/17/2012    Procedure: INTRAMEDULLARY (IM) NAIL INTERTROCHANTRIC;  Surgeon: Johnny Bridge, MD;  Location: [place];  Service: Orthopedics;  Laterality: Left;  Synthes GSN, Fracture Table, Big C arm    History  Substance Use Topics  . Smoking status: Never Smoker   . Smokeless tobacco: Never Used  . Alcohol Use: No    Family History  Problem Relation Age of Onset  . Stroke Mother   . Heart  disease Brother       Medication List       This list is accurate as of: 10/01/14  9:40 PM.  Always use your most recent med list.               acetaminophen 325 MG tablet  Commonly known as:  TYLENOL  Take 2 tablets (650 mg total) by mouth every 6 (six) hours as needed for pain.     atorvastatin 40 MG tablet  Commonly known as:  LIPITOR  Take 40 mg by mouth daily.     benztropine 1 MG tablet  Commonly known as:  COGENTIN  Take 1 mg by mouth at bedtime.     calcium-vitamin D 500-200 MG-UNIT per tablet  Commonly known as:  OSCAL WITH D  Take 1 tablet by mouth daily.     cholecalciferol 1000 UNITS tablet  Commonly known as:  VITAMIN D  Take 1,000 Units by mouth daily.     docusate sodium 100 MG capsule  Commonly known as:  COLACE  Take 100 mg by mouth 2 (two) times daily.     hydrocortisone 2.5 % cream  Apply 1 application topically 2 (two) times daily as needed (rash).     levothyroxine 112 MCG tablet  Commonly known as:  SYNTHROID, LEVOTHROID  Take 112 mcg by mouth daily.     magnesium oxide 400 MG tablet  Commonly known as:  MAG-OX  Take 400 mg by mouth 3 (three) times daily.     metoprolol succinate 25 MG 24 hr tablet  Commonly known as:  TOPROL-XL  Take 25 mg by mouth daily.     pantoprazole 40 MG tablet  Commonly known as:  PROTONIX  Take 40 mg by mouth daily.     polyethylene glycol packet  Commonly known as:  MIRALAX / GLYCOLAX  Take 17 g by mouth daily.     sennosides-docusate sodium 8.6-50 MG tablet  Commonly known as:  SENOKOT-S  Take 1 tablet by mouth daily.        Physical Exam  BP 134/76 mmHg  Pulse 73  Temp(Src) 97.8 F (36.6 C)  Resp 20  Ht 5\' 11"  (1.803 m)  Wt 204 lb 11.2 oz (92.851 kg)  BMI 28.56 kg/m2  SpO2 93%  Constitutional: WDWN elderly male in no acute distress. Conversant and pleasantly confused HEENT: Normocephalic and atraumatic. PERRL. EOM intact. No icterus. Oral mucosa dry. Posterior pharynx clear of any  exudate or lesions.  Neck: Supple and nontender. No lymphadenopathy, masses, or thyromegaly. No JVD or carotid bruits. Cardiac: Normal S1, S2. RRR without appreciable murmurs, rubs, or gallops. Distal pulses intact. Trace pitting edema of BLE, R>L Lungs: No respiratory distress. Breath sounds clear bilaterally without rales, rhonchi, or wheezes. Abdomen: Audible bowel sounds in all quadrants. Soft, nontender, nondistended. Midline surgical scar and RLQ scar from colostomy takedown noted on exam.  Musculoskeletal: able to move all extremities. Generalized weakness present. No joint erythema or tenderness.  Skin: Warm and dry. No rash noted. No erythema.  Neurological: Alert. Resting tremors noted.  Psychiatric: Appropriate mood and affect. Has dementia at baseline.   Labs Reviewed  CBC Latest Ref Rng 09/24/2014 09/03/2014 09/02/2014  WBC 4.0 - 10.5 K/uL 6.2 9.3 11.1(H)  Hemoglobin 13.0 - 17.0 g/dL 13.6 12.6(L) 13.8  Hematocrit 39.0 - 52.0 % 41.6 38.0(L) 41.5  Platelets 150 - 400 K/uL 255 158 198    CMP Latest Ref Rng 09/24/2014 09/04/2014 09/03/2014  Glucose 70 - 99 mg/dL 106(H) 109(H) 106(H)  BUN 6 - 23 mg/dL 10 14 14   Creatinine 0.50 - 1.35 mg/dL 0.80 0.78 0.64  Sodium 135 - 145 mmol/L 138 134(L) 133(L)  Potassium 3.5 - 5.1 mmol/L 4.4 3.6 3.4(L)  Chloride 96 - 112 mEq/L 97 96 96  CO2 19 - 32 mmol/L 32 34(H) 34(H)  Calcium 8.4 - 10.5 mg/dL 9.2 8.3(L) 8.2(L)  Total Protein 6.0 - 8.3 g/dL 6.7 - -  Total Bilirubin 0.3 - 1.2 mg/dL 0.6 - -  Alkaline Phos 39 - 117 U/L 183(H) - -  AST 0 - 37 U/L 18 - -  ALT 0 - 53 U/L 14 - -    Lab Results  Component Value Date   TSH 1.372 08/18/2012   Diagnostic Studies Reviewed 09/02/14: CT R hip: Nondisplaced right superior and inferior pubic rami fractures. Negative for hip fracture  09/02/14: Head CT: No acute intracranial pathology  Assessment & Plan 1. Essential hypertension Stable. Continue toprol 25mg  daily   2. Hypothyroidism,  unspecified hypothyroidism type Continue levothyroxine 143mcg daily   3. Fracture of multiple pubic rami, right, sequela Pain is adequately controlled with current regimen. Continue tylenol 650mg  every six hours as needed for pain. Continue to work [facility] PT/OT for gait/strength/balance training to restore/maximize functional capacity. Fall risk  precautions  4. HLD (hyperlipidemia) Continue lipitor 40mg  daily   5. Dementia with Parkinsonism  Not on medications for dementia. Continue cogentin 1mg  daily at bedtime for tremors. Fall risk precautions  6. Hx of cancer of lung Is being followed by oncology, Dr. Earlie Server.  7. Constipation, unspecified constipation type Stable. Continue sennakot-s daily, miralax 17g daily, and monitor. Encourage hydration and mobility as tolerated.   8. Swallowing dysfunction Aspiration precautions. Will be discharged home with [facility] ST for cognition/safetry strategies.    9. GERD Continue protonix 40mg  daily  10. Hypomagnesemia  Mag level 1.7 on 09/21/14. Continue mag-ox 400mg  three times daily. PCP to recheck mag level and adjust dosage as necessary.   Home health services: PT/OT/ST DME required: None PCP follow-up: f/u with PCP w/in 1-2 weeks of discharge from SNF 30-day supply of prescription medications provided.  Time spent: 40 minutes on care coordination  Family/Staff Communication Plan of care discussed with nursing staff. Nursing staff verbalized understanding and agree with plan of care. No additional questions or concerns reported.    Arthur Holms, MSN, AGNP-C [facility] [address] [place], [place] [address] [phone] [8am-5pm] After hours: [phone]

## 2015-01-16 ENCOUNTER — Other Ambulatory Visit: Payer: Self-pay | Admitting: Family Medicine

## 2015-01-16 ENCOUNTER — Ambulatory Visit (HOSPITAL_COMMUNITY): Payer: Medicare Other | Attending: Cardiology

## 2015-01-16 ENCOUNTER — Other Ambulatory Visit: Payer: Self-pay

## 2015-01-16 DIAGNOSIS — R011 Cardiac murmur, unspecified: Secondary | ICD-10-CM

## 2015-01-16 DIAGNOSIS — I10 Essential (primary) hypertension: Secondary | ICD-10-CM | POA: Insufficient documentation

## 2015-01-16 DIAGNOSIS — E785 Hyperlipidemia, unspecified: Secondary | ICD-10-CM | POA: Insufficient documentation

## 2015-03-19 ENCOUNTER — Encounter: Payer: Self-pay | Admitting: Neurology

## 2015-03-19 ENCOUNTER — Ambulatory Visit (INDEPENDENT_AMBULATORY_CARE_PROVIDER_SITE_OTHER): Payer: Medicare Other | Admitting: Neurology

## 2015-03-19 VITALS — BP 120/72 | HR 74 | Temp 98.1°F | Ht 71.0 in

## 2015-03-19 DIAGNOSIS — R569 Unspecified convulsions: Secondary | ICD-10-CM

## 2015-03-19 NOTE — Progress Notes (Signed)
[facility]    Provider:  Dr Jaynee Eagles Referring Provider: Derinda Late, MD Primary Care Physician:  Marylene Land, MD  CC:  Tremors, confusion  HPI:  [name] is a 81 y.o. male here as a referral from Dr. Sandi Mariscal for jerking/tremors. PMHx HTN, osteoporosis, hld, hypothyroidism, bilateral carotid stenosis less than 40%, NSClung cancer s/p left lower lobectomy in 2008 and chemotherapy(carboplatin and docetaxel last dose 2009), colon cancer in 2008 s/p colon resection, expressive aphasia and ataxia after CVA in 2012, tremor and myoclonus, lumbar radic, impaired glucose tolerance.    He is here with his daughter and wife who provide all the information. They describe two types of movements:  The jerking started in 2015. Currently In both arms. It is uncontrolled.  It is described as worse than shaking, severe bilateral shaking. This started in 2015. Wife first saw it on the 4th of July last year. He was sitting to standing when the incident happened and  it lasted 15-20 minutes, every minute or two it would increase and then decrease in severity, no alteration of consciousness. Episodes occur usually a few minutes after he gets up in the morning or possibly upon standing up. Sometimes it causes him to fall. He fell 2 weeks ago. Usually it is in the mornings after he gets up out of bed. It happens approx twice a month. He may have spells where he has one jerk mostly in one arm or one leg, favors the right arm. Sometimes even just at the table they will notice will notice it, explained as an acute brief shaking movement. The leg doesn't usually tremor but unclear.   The other type ot tremor started 3-4 years years ago. This was occurring continuously until metoprolol was started and that helped quite a lot. No tremors in the family as far as they know. No seizure history.   Reviewed notes, labs and imaging from outside physicians, which showed: Notes from Dr. Doretha Sou  Blomgren's office state that about a year ago he started to experience myoclonus of the right arm occasionally which has become bilateral orbital last several months. Episodes occur once or twice a day. Occasionally when he has them he has briefly standing or using his walker, when they precipitate a fall. Wife is unable to stop him from hitting the floor. He did sustain a pelvic fracture in December 2015 requiring hospitalization and rehabilitation. He has baseline ataxia presumably secondary to a CVA. He also has expressive aphasia. He has had a right hand tremor since 2015 which has been improved on metoprolol. He only has a faint, occasional resting tremor usually in his right arm.  Per ED notes, the rehab facility felt his right arm weakness and right facial droop were acute and in January and he was seen in the ED. MR of the brain showed no acute events. Wife at the time felt this was his baseline.  He was discharged from the ED.   MRi of the brain 09/24/2014:(personally reviewed images: would call atrophy greater mild, more moderate with chronic ischemic changes) 1. No acute intracranial abnormality. 2. Remote lacunar infarcts involving the right globus pallidus and left posterior lateral pons. 3. Mild atrophy and white matter disease is otherwise within normal limits for age. 4. 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   Review of Systems: Patient complains of symptoms per HPI as well as the following symptoms: Swelling in legs, hearing loss, diarrhea, impotence, memory loss, confusion, tremor, anxiety. Pertinent negatives  per HPI. All others negative.   History   Social History  . Marital Status: Married    Spouse Name: [name]   . Number of Children: 3  . Years of Education: 3   Occupational History  . Retired     Social History Main Topics  . Smoking status: Never Smoker   . Smokeless tobacco: Never Used  . Alcohol Use: No  . Drug Use: No  . Sexual Activity:  No   Other Topics Concern  . Not on file   Social History Narrative   Lives with wife, [name].       Family History  Problem Relation Age of Onset  . Stroke Mother   . Heart disease Brother     Past Medical History  Diagnosis Date  . Hyperlipidemia   . Hypertension   . Allergy   . Fractured shoulder 8/30?    pt fell about 3 weeks ago and fractured shoulder blade   . Fracture, intertrochanteric, left femur 08/17/2012  . COPD (chronic obstructive pulmonary disease)   . colon ca dx'd 1994    chemo  . Lung cancer dx'd 07/2007    lu-lobectomy    Past Surgical History  Procedure Laterality Date  . Colon surgery    . Colostomy takedown    . Colostomy revision    . Hernia repair    . Femur fracture surgery  08/17/2012  . Intramedullary (im) nail intertrochanteric  08/17/2012    Procedure: INTRAMEDULLARY (IM) NAIL INTERTROCHANTRIC;  Surgeon: Johnny Bridge, MD;  Location: [place];  Service: Orthopedics;  Laterality: Left;  Synthes GSN, Fracture Table, Big C arm    Current Outpatient Prescriptions  Medication Sig Dispense Refill  . acetaminophen (TYLENOL) 325 MG tablet Take 2 tablets (650 mg total) by mouth every 6 (six) hours as needed for pain. (Patient taking differently: Take 650 mg by mouth every 6 (six) hours as needed for moderate pain. ) 60 tablet 1  . aspirin 81 MG tablet Take 81 mg by mouth daily.    Marland Kitchen atorvastatin (LIPITOR) 40 MG tablet Take 40 mg by mouth daily.      . calcium-vitamin D (OSCAL WITH D) 500-200 MG-UNIT per tablet Take 1 tablet by mouth daily. 100 tablet 2  . cholecalciferol (VITAMIN D) 1000 UNITS tablet Take 1,000 Units by mouth daily.    . hydrocortisone 2.5 % cream Apply 1 application topically 2 (two) times daily as needed (rash).   0  . levothyroxine (SYNTHROID, LEVOTHROID) 112 MCG tablet Take 112 mcg by mouth daily.    . magnesium oxide (MAG-OX) 400 MG tablet Take 400 mg by mouth 3 (three) times daily.     . metoprolol succinate (TOPROL-XL) 25  MG 24 hr tablet Take 25 mg by mouth daily.   1  . pantoprazole (PROTONIX) 40 MG tablet Take 40 mg by mouth daily.   0  . potassium chloride (K-DUR) 10 MEQ tablet   0  . Probiotic Product (ALIGN PO) Take 1 tablet by mouth daily.    Marland Kitchen triamterene-hydrochlorothiazide (MAXZIDE-25) 37.5-25 MG per tablet Take 1 tablet by mouth every morning.  0  . benztropine (COGENTIN) 1 MG tablet Take 1 mg by mouth at bedtime.    . levETIRAcetam (KEPPRA) 500 MG tablet Take 1 tablet (500 mg total) by mouth 2 (two) times daily. 60 tablet 11   No current facility-administered medications for this visit.    Allergies as of 03/19/2015 - Review Complete 03/19/2015  Allergen Reaction Noted  .  Morphine and related Other (See Comments) 10/13/2011  . Codeine  04/28/2011    Vitals: BP 120/72 mmHg  Pulse 74  Temp(Src) 98.1 F (36.7 C) (Oral)  Ht '5\' 11"'$  (1.803 m) Last Weight:  Wt Readings from Last 1 Encounters:  10/01/14 204 lb 11.2 oz (92.851 kg)   Last Height:   Ht Readings from Last 1 Encounters:  03/19/15 '5\' 11"'$  (1.803 m)      Physical exam: Exam: Gen: NAD, not conversant, wanted to go home and did not want to be in the office           CV: RRR, no MRG. No Carotid Bruits. Mild peripheral edema, warm, nontender Eyes: Conjunctivae clear without exudates or hemorrhage  Neuro: Detailed Neurologic Exam  Speech:    Speech is aphasic;  Cognition:    The patient  can follow simple commands    recent and remote memory impaired;     language aphasic;     Impaired attention, concentration,     fund of knowledge impaired Cranial Nerves:    The pupils are equal, round, and reactive to light. Attempted, could not visualize fundi. Could not count fingers in any visual fields, did blink to threat, difficult to assess due to mental status. Impaired upgaze otherwise extraocular movements are intact. Impaired smooth pursuit with few beats end-gave horizontal nystagmus. Trigeminal sensation is intact and the  muscles of mastication are normal. Left NL flattening. The palate elevates in the midline. Hearing appears decreased to voice. Voice is normal. Shoulder shrug is normal. The tongue has normal motion without fasciculations.   Coordination:    No dysmetria noted but difficult to test due to increased tone on the right and weakness and mental status  Gait:    Attempted, patient can bear weight, in wheel chair today  Motor Observation:    Right postural tremor, right pronator drift Tone:    Increased tone on the  Right with fisting of the right hand  Posture:    stooped    Strength:    Antigravity in upper and lower limbs. Right pronator drift.     Sensation: intact to LT     Reflex Exam:  DTR's:    Deep tendon reflexes in the upper and lower extremities are difficult to assess due to paratonia Toes:    The toes are equivocal bilaterally.   Clonus:    Clonus is absent.   Assessment/Plan:  :  [name] is a 81 y.o. male here as a referral from Dr. Sandi Mariscal for jerking/tremors. PMHx HTN, osteoporosis, hld, hypothyroidism, bilateral carotid stenosis less than 40%, NSClung cancer s/p left lower lobectomy in 2008 and chemotherapy(carboplatin and docetaxel last dose 2009), colon cancer in 2008 s/p colon resection, expressive aphasia and ataxia after CVA in 2012, tremor and myoclonus, lumbar radic, impaired glucose tolerance.  He is here for evaluation of abnormal movements, episodes of continued bilateral shaking as well as jerking movements concerning for epileptic etiology. EEG in the office is abnormal due to diffuse background slowing and associated bihemispheric triphasic waves that are asymmetric. The EEG suggests diffuse neuronal dysfunction, possibly associated with a toxic or metabolic disturbance. The presence of triphasic waves suggest a lowered seizure threshold.   Will start Keppra '500mg'$  bid and repeat EEG in 10-14 days.Discussed with wife.  Asked if she could videotape any  episodes of shaking or jerking movements. Only is she can safely do this, possibly if the daughter is there when it happens. Will forward note  to Dr. Sandi Mariscal, will inquire if toxic/metabolic encephalopathy causes of eeg slowing such as infections could be explored if it hasn't been completed recently.   Sarina Ill, MD  [facility] [address] [place], [place] [address]  Phone [phone] Fax [phone]

## 2015-03-19 NOTE — Patient Instructions (Signed)
Overall you are doing fairly well but I do want to suggest a few things today:   Remember to drink plenty of fluid, eat healthy meals and do not skip any meals. Try to eat protein with a every meal and eat a healthy snack such as fruit or nuts in between meals. Try to keep a regular sleep-wake schedule and try to exercise daily, particularly in the form of walking, 20-30 minutes a day, if you can.   As far as diagnostic testing: EEG  I would like to see you back in 4 months, sooner if we need to. Please call us with any interim questions, concerns, problems, updates or refill requests.   Please also call us for any test results so we can go over those with you on the phone.  My clinical assistant and will answer any of your questions and relay your messages to me and also relay most of my messages to you.   Our phone number is [phone]. We also have an after hours call service for urgent matters and there is a physician on-call for urgent questions. For any emergencies you know to call 911 or go to the nearest emergency room

## 2015-03-20 ENCOUNTER — Ambulatory Visit (INDEPENDENT_AMBULATORY_CARE_PROVIDER_SITE_OTHER): Payer: Medicare Other | Admitting: Neurology

## 2015-03-20 DIAGNOSIS — R569 Unspecified convulsions: Secondary | ICD-10-CM | POA: Diagnosis not present

## 2015-03-20 NOTE — Procedures (Signed)
     History: [name] is an 81 year old gentleman with a history of dementia with parkinsonism, he has had episodes of jerking of the extremities involving one arm or one leg over the last year. The patient has a history of a prior stroke with aphasia. He is being evaluated for the above episodes of jerking.  This is a routine EEG. No skull defects are noted. Medications include aspirin, Lipitor, Cogentin, vitamin D, Synthroid, magnesium oxide, metoprolol, Protonix, potassium supplementation, and Maxide.  EEG classification: Dysrhythmia grade 3 triphasics  Description of the recording: The background rhythms of this recording consists of a disorganized background activity of 6 Hz. As the record progresses, photic stimulation and hyperventilation were not performed. The patient appears to have muscle artifact that is more prominent over the right hemisphere than the left. Intermittently, there appears to be lower amplitude triphasic appearing waves that are seen in both hemispheres, but appear to be asymmetric, occurring independently, but are more prominent on the right than the left. Spike or spike-wave discharges are not seen during the recording. EKG monitor reveals an irregular heart rhythm with a rate of 72.  Impression: This is an abnormal EEG recording secondary to diffuse background slowing and associated bihemispheric triphasic waves that are asymmetric. The study suggests diffuse neuronal dysfunction, possibly associated with a toxic or metabolic disturbance. The presence of triphasic waves suggest a lowered seizure threshold. Clinical correlation is required. No electrographic seizures were recorded.

## 2015-03-24 ENCOUNTER — Other Ambulatory Visit: Payer: Self-pay | Admitting: Neurology

## 2015-03-24 ENCOUNTER — Telehealth: Payer: Self-pay | Admitting: Neurology

## 2015-03-24 MED ORDER — LEVETIRACETAM 500 MG PO TABS: 500.0000 mg | ORAL_TABLET | Freq: Two times a day (BID) | ORAL | Status: DC

## 2015-03-24 NOTE — Telephone Encounter (Signed)
Spoke with patient's wife. EEG showed diffuse background slowing and associated bihemispheric triphasic  waves that are asymmetric suggesting diffuse neuronal  dysfunction, possibly associated with a toxic or metabolic  disturbance. The presence of triphasic waves suggest a lowered seizure threshold. Explained that she should follow up with pcp for evaluation of any toxic/metabolic cause such as UTI and will start Keppra '500mg'$  bid.   Faith - can you please call [name] and schedule a repeat EEG around the 27th please? Thank you.

## 2015-03-28 ENCOUNTER — Telehealth: Payer: Self-pay | Admitting: Neurology

## 2015-03-28 NOTE — Telephone Encounter (Signed)
Patient's wife is calling.She states the patient takes levETIRAcetam (KEPPRA) 500 MG tablet. Could this medication be making the patient very weak and now the patient is unable to stand up. He started taking levETIRAcetam (KEPPRA) 500 MG tablet last Sunday. Please call to discuss. Thank you.

## 2015-03-28 NOTE — Telephone Encounter (Signed)
Spoke to the patient's wife. He has been showing generalized weakness, inability to stand up even with assistance and less responsiveness since starting Keppra last Sunday.. I recommended we stop the Keppra. If patient's wife is not able to care for him or if he gets worse she was advised to take him to the emergency room. She acknowledged understanding.

## 2015-03-28 NOTE — Telephone Encounter (Signed)
Patients wife called back again and requested to speak with someone, she is very upset and worried and has requested that someone call her back today. Please call and advise.

## 2015-03-28 NOTE — Telephone Encounter (Signed)
Tried calling wife back. Phone just kept ringing. Let her know Dr. Leonie Man will call back and he has been with pt all day. Dr. Jaynee Eagles out of office and he is the work-in.

## 2015-03-29 NOTE — Telephone Encounter (Signed)
Thanks Dr. Leonie Man. [name], please call them on Monday and let me know how he is doing. If he is better off of the Keppra, we can try another AED. If he is not better and his weakness was due to other etiology, will discuss with wife on Monday. Thank you.

## 2015-04-01 NOTE — Telephone Encounter (Signed)
Patient is calling back. Please call at same number.  Thanks!

## 2015-04-01 NOTE — Telephone Encounter (Signed)
Spoke w/ pt wife and she stated pt is doing better off of the [place]. He was not able to hold himself up with his legs previously while on Keppra and now can use a walker and go short distances. Back to baseline per wife. Asked if they would like to try a different AED, but wife hesitant because she paid $86.00 for Keppra and they even got the generic brand. I told her I would speak w/ Dr. Jaynee Eagles and call her back. She verbalized understanding.

## 2015-04-01 NOTE — Telephone Encounter (Signed)
Left VM for pt to call back. Gave [facility] phone number and told them we are open until 5pm.

## 2015-04-02 ENCOUNTER — Other Ambulatory Visit: Payer: Self-pay | Admitting: Neurology

## 2015-04-02 NOTE — Telephone Encounter (Signed)
Patient did not tolerate Keppra, it made his legs weak. He was evaluated by his pcp.  Spoke to patent's wife. She does not want to start a new medication at this time. I encouraged her to call me back when or if she feels comfortable trying another anti-epileptic. His EEG was abnormal. She is worried about the side effects. All medications have side effects. I understand her being hesitant. She can call me back or schedule and appointment to discuss. thanks

## 2015-07-05 NOTE — Telephone Encounter (Signed)
Error

## 2015-07-17 ENCOUNTER — Encounter: Payer: Self-pay | Admitting: Neurology

## 2015-07-17 ENCOUNTER — Ambulatory Visit (INDEPENDENT_AMBULATORY_CARE_PROVIDER_SITE_OTHER): Payer: Medicare Other | Admitting: Neurology

## 2015-07-17 VITALS — BP 110/70 | HR 95 | Temp 97.8°F | Ht 71.0 in

## 2015-07-17 DIAGNOSIS — R258 Other abnormal involuntary movements: Secondary | ICD-10-CM

## 2015-07-17 DIAGNOSIS — R252 Cramp and spasm: Secondary | ICD-10-CM

## 2015-07-17 MED ORDER — DIVALPROEX SODIUM 500 MG PO DR TAB: 500.0000 mg | DELAYED_RELEASE_TABLET | Freq: Two times a day (BID) | ORAL | Status: DC

## 2015-07-17 NOTE — Patient Instructions (Signed)
Overall you are doing fairly well but I do want to suggest a few things today:   Remember to drink plenty of fluid, eat healthy meals and do not skip any meals. Try to eat protein with a every meal and eat a healthy snack such as fruit or nuts in between meals. Try to keep a regular sleep-wake schedule and try to exercise daily, particularly in the form of walking, 20-30 minutes a day, if you can.   As far as your medications are concerned, I would like to suggest: Depakote twice daily  I would like to see you back in 3 months, sooner if we need to. Please call us with any interim questions, concerns, problems, updates or refill requests.   Please also call us for any test results so we can go over those with you on the phone.  My clinical assistant and will answer any of your questions and relay your messages to me and also relay most of my messages to you.   Our phone number is [phone]. We also have an after hours call service for urgent matters and there is a physician on-call for urgent questions. For any emergencies you know to call 911 or go to the nearest emergency room

## 2015-07-17 NOTE — Progress Notes (Signed)
[facility]    Provider:  Dr Jaynee Eagles Referring Provider: Derinda Late, MD Primary Care Physician:  Marylene Land, MD  [facility]: Tremors, confusion  Interval history 07/17/2015:  [name] is a 81 y.o. male here as a referral from Dr. Sandi Mariscal for jerking/tremors. Here with his lovely wife and daughter. Did not tolerate the Keppra. . No significant episodes of episodes of jerking since last being seen, it has happened a few times. Offered a 3-day EEG ambulatory at home, wife reports that patient will not tolerate and will pull leads.  Recommend trying Depakote which may help with agitation/mood as well. Also discussed in-patient EEG in in an EMU for 72 hours to try and catch an event or further examine for epileptiform activity and could refer him to [facility] in [place] if needed. Wife doesn't think she can bring him to the hospital, would be too much for her and him.  She will try Depakote. She can call me anytime, as often as she likes for questions or concerns.   Routine EEG: This is an abnormal EEG recording secondary to diffuse background slowing and associated bihemispheric triphasic waves that are asymmetric. The study suggests diffuse neuronal dysfunction, possibly associated with a toxic or metabolic disturbance. The presence of triphasic waves suggest a lowered seizure threshold. Clinical correlation is required. No electrographic seizures were recorded.  HPI: [name] is a 81 y.o. male here as a referral from Dr. Sandi Mariscal for jerking/tremors. PMHx HTN, osteoporosis, hld, hypothyroidism, bilateral carotid stenosis less than 40%, NSClung cancer s/p left lower lobectomy in 2008 and chemotherapy(carboplatin and docetaxel last dose 2009), colon cancer in 2008 s/p colon resection, expressive aphasia and ataxia after CVA in 2012, tremor and myoclonus, lumbar radic, impaired glucose tolerance.    He is here with his daughter and wife who provide all the  information. They describe two types of movements:  The jerking started in 2015. Currently In both arms. It is uncontrolled. It is described as worse than shaking, severe bilateral shaking. This started in 2015. Wife first saw it on the 4th of July last year. He was sitting to standing when the incident happened and it lasted 15-20 minutes, every minute or two it would increase and then decrease in severity, no alteration of consciousness. Episodes occur usually a few minutes after he gets up in the morning or possibly upon standing up. Sometimes it causes him to fall. He fell 2 weeks ago. Usually it is in the mornings after he gets up out of bed. It happens approx twice a month. He may have spells where he has one jerk mostly in one arm or one leg, favors the right arm. Sometimes even just at the table they will notice will notice it, explained as an acute brief shaking movement. The leg doesn't usually tremor but unclear.   The other type ot tremor started 3-4 years years ago. This was occurring continuously until metoprolol was started and that helped quite a lot. No tremors in the family as far as they know. No seizure history.   Reviewed notes, labs and imaging from outside physicians, which showed: Notes from Dr. Doretha Sou Blomgren's office state that about a year ago he started to experience myoclonus of the right arm occasionally which has become bilateral orbital last several months. Episodes occur once or twice a day. Occasionally when he has them he has briefly standing or using his walker, when they precipitate a fall. Wife is unable to stop  him from hitting the floor. He did sustain a pelvic fracture in December 2015 requiring hospitalization and rehabilitation. He has baseline ataxia presumably secondary to a CVA. He also has expressive aphasia. He has had a right hand tremor since 2015 which has been improved on metoprolol. He only has a faint, occasional resting tremor usually in his right  arm.  Per ED notes, the rehab facility felt his right arm weakness and right facial droop were acute and in January and he was seen in the ED. MR of the brain showed no acute events. Wife at the time felt this was his baseline. He was discharged from the ED.   MRi of the brain 09/24/2014:(personally reviewed images: would call atrophy greater mild, more moderate with chronic ischemic changes) 1. No acute intracranial abnormality. 2. Remote lacunar infarcts involving the right globus pallidus and left posterior lateral pons. 3. Mild atrophy and white matter disease is otherwise within normal limits for age. 4. 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   Review of Systems: Patient complains of symptoms per HPI as well as the following symptoms: Swelling in legs, hearing loss, diarrhea, impotence, memory loss, confusion, tremor, anxiety. Pertinent negatives per HPI. All others negative. .   Social History   Social History  . Marital Status: Married    Spouse Name: Barbaraann Share   . Number of Children: 3  . Years of Education: 3   Occupational History  . Retired     Social History Main Topics  . Smoking status: Never Smoker   . Smokeless tobacco: Never Used  . Alcohol Use: No  . Drug Use: No  . Sexual Activity: No   Other Topics Concern  . Not on file   Social History Narrative   Lives with wife, Barbaraann Share.       Family History  Problem Relation Age of Onset  . Stroke Mother   . Heart disease Brother     Past Medical History  Diagnosis Date  . Hyperlipidemia   . Hypertension   . Allergy   . Fractured shoulder 8/30?    pt fell about 3 weeks ago and fractured shoulder blade   . Fracture, intertrochanteric, left femur 08/17/2012  . COPD (chronic obstructive pulmonary disease)   . colon ca dx'd 1994    chemo  . Lung cancer dx'd 07/2007    lu-lobectomy    Past Surgical History  Procedure Laterality Date  . Colon surgery    . Colostomy takedown    .  Colostomy revision    . Hernia repair    . Femur fracture surgery  08/17/2012  . Intramedullary (im) nail intertrochanteric  08/17/2012    Procedure: INTRAMEDULLARY (IM) NAIL INTERTROCHANTRIC;  Surgeon: Johnny Bridge, MD;  Location: [facility];  Service: Orthopedics;  Laterality: Left;  Synthes GSN, Fracture Table, Big C arm    Current Outpatient Prescriptions  Medication Sig Dispense Refill  . acetaminophen (TYLENOL) 325 MG tablet Take 2 tablets (650 mg total) by mouth every 6 (six) hours as needed for pain. (Patient taking differently: Take 650 mg by mouth every 6 (six) hours as needed for moderate pain. ) 60 tablet 1  . aspirin 81 MG tablet Take 81 mg by mouth daily.    Marland Kitchen atorvastatin (LIPITOR) 40 MG tablet Take 40 mg by mouth daily.      . benztropine (COGENTIN) 1 MG tablet Take 1 mg by mouth at bedtime.    . cholecalciferol (VITAMIN D) 1000 UNITS tablet Take  1,000 Units by mouth daily.    . finasteride (PROSCAR) 5 MG tablet Take 5 mg by mouth daily.  1  . hydrocortisone 2.5 % cream Apply 1 application topically 2 (two) times daily as needed (rash).   0  . levETIRAcetam (KEPPRA) 500 MG tablet Take 1 tablet (500 mg total) by mouth 2 (two) times daily. 60 tablet 11  . levothyroxine (SYNTHROID, LEVOTHROID) 112 MCG tablet Take 112 mcg by mouth daily.    . magnesium oxide (MAG-OX) 400 MG tablet Take 400 mg by mouth 3 (three) times daily.     . metoprolol succinate (TOPROL-XL) 25 MG 24 hr tablet Take 25 mg by mouth daily.   1  . pantoprazole (PROTONIX) 40 MG tablet Take 40 mg by mouth daily.   0  . potassium chloride (K-DUR) 10 MEQ tablet   0  . Probiotic Product (ALIGN PO) Take 1 tablet by mouth daily.    Marland Kitchen triamterene-hydrochlorothiazide (MAXZIDE-25) 37.5-25 MG per tablet Take 1 tablet by mouth every morning.  0   No current facility-administered medications for this visit.    Allergies as of 07/17/2015 - Review Complete 03/19/2015  Allergen Reaction Noted  . Morphine and related Other  (See Comments) 10/13/2011  . Codeine  04/28/2011    Vitals: BP 110/70 mmHg  Pulse 95  Ht '5\' 11"'$  (1.803 m) Last Weight:  Wt Readings from Last 1 Encounters:  10/01/14 204 lb 11.2 oz (92.851 kg)   Last Height:   Ht Readings from Last 1 Encounters:  07/17/15 '5\' 11"'$  (1.803 m)    Physical exam: Exam: Gen: NAD, not conversant, wanted to go home and did not want to be in the office, opened the door and pointed out  CV: RRR, no MRG. No Carotid Bruits. Mild peripheral edema, warm, nontender Eyes: Conjunctivae clear without exudates or hemorrhage  Neuro: No changes in neurologic exam, stable Detailed Neurologic Exam  Speech:  Speech is aphasic;  Cognition:  The patient can follow simple commands  recent and remote memory impaired;   language aphasic;   Impaired attention, concentration,   fund of knowledge impaired Cranial Nerves:  The pupils are equal, round, and reactive to light. Attempted, could not visualize fundi. Could not count fingers in any visual fields, did blink to threat, difficult to assess due to mental status. Impaired upgaze otherwise extraocular movements are intact. Impaired smooth pursuit with few beats end-gave horizontal nystagmus. Trigeminal sensation is intact and the muscles of mastication are normal. Left NL flattening. The palate elevates in the midline. Hearing appears decreased to voice. Voice is normal. Shoulder shrug is normal. The tongue has normal motion without fasciculations.   Coordination:  No dysmetria noted but difficult to test due to increased tone on the right and weakness and mental status  Motor Observation:  Right postural tremor, right pronator drift Tone:  Increased tone on the Right with fisting of the right hand  Posture:  stooped   Strength:  Antigravity in upper and lower limbs. Right pronator drift.   Sensation: intact to LT   Reflex Exam:  DTR's:  Deep tendon reflexes  in the upper and lower extremities are difficult to assess due to paratonia Toes:  The toes are equivocal bilaterally.  Clonus:  Clonus is absent.   Assessment/Plan: : TREMON SAINVIL is a 81 y.o. male here as a referral from Dr. Sandi Mariscal for jerking/tremors. PMHx HTN, osteoporosis, hld, hypothyroidism, bilateral carotid stenosis less than 40%, NSClung cancer s/p left lower lobectomy in 2008  and chemotherapy(carboplatin and docetaxel last dose 2009), colon cancer in 2008 s/p colon resection, expressive aphasia and ataxia after CVA in 2012, tremor and myoclonus, lumbar radic, impaired glucose tolerance. He is here for evaluation of abnormal movements, episodes of continued bilateral shaking as well as jerking movements concerning for epileptic etiology. EEG in the office is abnormal due to diffuse background slowing and associated bihemispheric triphasic waves that are asymmetric. The EEG suggests diffuse neuronal dysfunction, possibly associated with a toxic or metabolic disturbance. The presence of triphasic waves suggest a lowered seizure threshold.   Did not tolerate Keppra. Will start Depakote which may help with agitation as well. Will start Depakote. Discussed the most common side effects of Depakote including serious reactions which can include hepatotoxicity, pancreatitis, hyponatremia, pancytopenia, thrombocytopenia and patient's wife denies any history of liver disease or electrolyte imbalances. She is to stop for any concerning symptoms especially rash, suicidality, psychosis and hallucinations., And reactions include headache, nausea vomiting, somnolence, thrombocytopenia, dyspepsia, dizziness, diarrhea, abdominal pain, tremor, alopecia, weight changes, appetite changes, constipation and other side effects. Please can stop for anything concerning.  Wife does not think extended eeg in home or inpatient is doable.   Asked if she could videotape any episodes of shaking or jerking  movements. Only if she can safely do this, possibly if the daughter is there when it happens.    Sarina Ill, MD  [facility] [address] [place], [place] [address]  Phone [phone] Fax [phone]  A total of 30 minutes was spent face-to-face with this patient. Over half this time was spent on counseling patient on the jerking/myoclonus diagnosis and different diagnostic and therapeutic options available.

## 2015-07-19 ENCOUNTER — Telehealth: Payer: Self-pay | Admitting: Neurology

## 2015-07-19 NOTE — Telephone Encounter (Signed)
Pt's wife called sts divalproex (DEPAKOTE) 500 MG DR tablet pill is too large for pt to swallow. Please call and advise

## 2015-07-23 NOTE — Telephone Encounter (Signed)
Of course!  I called the pharmacy.  Spoke with Mallory.  Unfortunately, they are unable to obtain a powder form of this medication.  There is a lower dose of Depakote Sprinkle caps available, which can be opened, however it is brand name only.  Alternatively, Depakene Liquid is available in generic, and is noted as interchangeable with Depakote tabs.  I called back.  Spoke with Ms Dethloff.  Says she is trying to switch pharmacies and wants to speak with them first.  Told her we will be happy to provide Rx to any pharmacy they choose.  She expressed understanding and appreciation.  She will call us back once they have determined what they'd like to do.

## 2015-07-23 NOTE — Telephone Encounter (Signed)
Janett Billow, I am out of the office. Could you help me? Can you convert this prescription to liquid or powder for me and call the patient's wife back? I think it comes in both? Would you look into it for me please and see which is less expensive? thanks

## 2015-07-23 NOTE — Telephone Encounter (Signed)
THANK YOU !!! She can't grind the pills she can, can she?

## 2015-08-12 ENCOUNTER — Other Ambulatory Visit: Payer: Self-pay | Admitting: Medical Oncology

## 2015-08-12 DIAGNOSIS — C3412 Malignant neoplasm of upper lobe, left bronchus or lung: Secondary | ICD-10-CM

## 2015-08-13 ENCOUNTER — Ambulatory Visit (HOSPITAL_COMMUNITY)
Admission: RE | Admit: 2015-08-13 | Discharge: 2015-08-13 | Disposition: A | Discharge status: Home / Self Care | Payer: Medicare Other | Source: Ambulatory Visit | Attending: Physician Assistant | Admitting: Physician Assistant

## 2015-08-13 ENCOUNTER — Other Ambulatory Visit (HOSPITAL_BASED_OUTPATIENT_CLINIC_OR_DEPARTMENT_OTHER): Payer: Medicare Other

## 2015-08-13 ENCOUNTER — Encounter (HOSPITAL_COMMUNITY): Payer: Self-pay

## 2015-08-13 DIAGNOSIS — Z902 Acquired absence of lung [part of]: Secondary | ICD-10-CM | POA: Insufficient documentation

## 2015-08-13 DIAGNOSIS — C3412 Malignant neoplasm of upper lobe, left bronchus or lung: Secondary | ICD-10-CM

## 2015-08-13 DIAGNOSIS — I2510 Atherosclerotic heart disease of native coronary artery without angina pectoris: Secondary | ICD-10-CM | POA: Insufficient documentation

## 2015-08-13 DIAGNOSIS — Z85118 Personal history of other malignant neoplasm of bronchus and lung: Secondary | ICD-10-CM

## 2015-08-13 DIAGNOSIS — J432 Centrilobular emphysema: Secondary | ICD-10-CM | POA: Diagnosis not present

## 2015-08-13 LAB — CBC WITH DIFFERENTIAL/PLATELET
BASO%: 0.9 % (ref 0.0–2.0)
BASOS ABS: 0.1 10*3/uL (ref 0.0–0.1)
EOS%: 1.4 % (ref 0.0–7.0)
Eosinophils Absolute: 0.1 10*3/uL (ref 0.0–0.5)
HCT: 42.3 % (ref 38.4–49.9)
HEMOGLOBIN: 14 g/dL (ref 13.0–17.1)
LYMPH#: 1 10*3/uL (ref 0.9–3.3)
LYMPH%: 17.6 % (ref 14.0–49.0)
MCH: 29.8 pg (ref 27.2–33.4)
MCHC: 33 g/dL (ref 32.0–36.0)
MCV: 90.2 fL (ref 79.3–98.0)
MONO#: 0.4 10*3/uL (ref 0.1–0.9)
MONO%: 6.7 % (ref 0.0–14.0)
NEUT#: 4.2 10*3/uL (ref 1.5–6.5)
NEUT%: 73.4 % (ref 39.0–75.0)
Platelets: 233 10*3/uL (ref 140–400)
RBC: 4.69 10*6/uL (ref 4.20–5.82)
RDW: 14.8 % — ABNORMAL HIGH (ref 11.0–14.6)
WBC: 5.7 10*3/uL (ref 4.0–10.3)

## 2015-08-13 LAB — COMPREHENSIVE METABOLIC PANEL
AST: 16 U/L (ref 5–34)
Albumin: 3.7 g/dL (ref 3.5–5.0)
Alkaline Phosphatase: 67 U/L (ref 40–150)
Anion Gap: 12 mEq/L — ABNORMAL HIGH (ref 3–11)
BILIRUBIN TOTAL: 1.14 mg/dL (ref 0.20–1.20)
BUN: 12.8 mg/dL (ref 7.0–26.0)
CO2: 30 mEq/L — ABNORMAL HIGH (ref 22–29)
CREATININE: 0.9 mg/dL (ref 0.7–1.3)
Calcium: 10 mg/dL (ref 8.4–10.4)
Chloride: 100 mEq/L (ref 98–109)
EGFR: 74 mL/min/{1.73_m2} — ABNORMAL LOW (ref >90)
GLUCOSE: 106 mg/dL (ref 70–140)
Potassium: 3.6 mEq/L (ref 3.5–5.1)
SODIUM: 142 meq/L (ref 136–145)
TOTAL PROTEIN: 7.2 g/dL (ref 6.4–8.3)

## 2015-08-15 ENCOUNTER — Encounter: Payer: Self-pay | Admitting: Internal Medicine

## 2015-08-15 ENCOUNTER — Ambulatory Visit (HOSPITAL_BASED_OUTPATIENT_CLINIC_OR_DEPARTMENT_OTHER): Payer: Medicare Other | Admitting: Internal Medicine

## 2015-08-15 VITALS — BP 141/58 | HR 77 | Resp 18 | Ht 71.0 in

## 2015-08-15 DIAGNOSIS — Z85118 Personal history of other malignant neoplasm of bronchus and lung: Secondary | ICD-10-CM

## 2015-08-15 DIAGNOSIS — C3412 Malignant neoplasm of upper lobe, left bronchus or lung: Secondary | ICD-10-CM

## 2015-08-15 NOTE — Progress Notes (Signed)
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   PRIOR THERAPY:  1. Status post left upper lobectomy with lymph node dissection under the care of Dr. Arlyce Dice on October 10, 2007. 2. Status post 4 cycles of adjuvant chemotherapy with carboplatin and docetaxel. Last dose was given Jan 20, 2008.  CURRENT THERAPY: Observation.  INTERVAL HISTORY: [name] 81 y.o. male returns to the clinic today for routine annual followup visit accompanied his wife and daughter. The patient is feeling fine today with no specific complaints except for mild fatigue and generalized weakness as well as tumors. He has been observation for the last 8 years. He denied having any significant weight loss or night sweats. He has no chest pain, shortness breath, cough or hemoptysis. He had repeat CT scan of the chest performed recently and he is here today for evaluation and discussion of his scan results.  MEDICAL HISTORY: Past Medical History  Diagnosis Date  . Hyperlipidemia   . Hypertension   . Allergy   . Fractured shoulder 8/30?    pt fell about 3 weeks ago and fractured shoulder blade   . Fracture, intertrochanteric, left femur ([place]) 08/17/2012  . COPD (chronic obstructive pulmonary disease) ([place])   . colon ca dx'd 1994    chemo  . Lung cancer ([place]) dx'd 07/2007    lu-lobectomy    ALLERGIES:  is allergic to morphine and related and codeine.  MEDICATIONS:  Current Outpatient Prescriptions  Medication Sig Dispense Refill  . acetaminophen (TYLENOL) 325 MG tablet Take 2 tablets (650 mg total) by mouth every 6 (six) hours as needed for pain. (Patient taking differently: Take 650 mg by mouth every 6 (six) hours as needed for moderate pain. ) 60  tablet 1  . aspirin 81 MG tablet Take 81 mg by mouth daily.    Marland Kitchen atorvastatin (LIPITOR) 40 MG tablet Take 40 mg by mouth daily.      . benztropine (COGENTIN) 1 MG tablet Take 1 mg by mouth at bedtime.    . betamethasone valerate lotion (VALISONE) 0.1 % Apply 1 application topically as needed.  0  . cholecalciferol (VITAMIN D) 1000 UNITS tablet Take 1,000 Units by mouth daily.    . divalproex (DEPAKOTE) 500 MG DR tablet Take 1 tablet (500 mg total) by mouth 2 (two) times daily. 60 tablet 6  . finasteride (PROSCAR) 5 MG tablet Take 5 mg by mouth daily.  1  . hydrocortisone 2.5 % cream Apply 1 application topically 2 (two) times daily as needed (rash).   0  . levETIRAcetam (KEPPRA) 500 MG tablet Take 1 tablet (500 mg total) by mouth 2 (two) times daily. 60 tablet 11  . levothyroxine (SYNTHROID, LEVOTHROID) 112 MCG tablet Take 112 mcg by mouth daily.    . magnesium oxide (MAG-OX) 400 MG tablet Take 400 mg by mouth 3 (three) times daily.     . metoprolol succinate (TOPROL-XL) 25 MG 24 hr tablet Take 25 mg by mouth daily.   1  . pantoprazole (PROTONIX) 40 MG tablet Take 40 mg by mouth daily.   0  . potassium chloride 20 MEQ/15ML (10%) SOLN Take by mouth daily.  1  . Probiotic Product (ALIGN PO) Take 1 tablet by mouth daily.    Marland Kitchen  triamterene-hydrochlorothiazide (MAXZIDE-25) 37.5-25 MG per tablet Take 1 tablet by mouth every morning.  0   No current facility-administered medications for this visit.    SURGICAL HISTORY:  Past Surgical History  Procedure Laterality Date  . Colon surgery    . Colostomy takedown    . Colostomy revision    . Hernia repair    . Femur fracture surgery  08/17/2012  . Intramedullary (im) nail intertrochanteric  08/17/2012    Procedure: INTRAMEDULLARY (IM) NAIL INTERTROCHANTRIC;  Surgeon: Johnny Bridge, MD;  Location: [facility];  Service: Orthopedics;  Laterality: Left;  Synthes GSN, Fracture Table, Big C arm    REVIEW OF SYSTEMS:  A comprehensive review of systems was  negative except for: Constitutional: positive for fatigue Musculoskeletal: positive for muscle weakness Neurological: positive for tremors   PHYSICAL EXAMINATION: General appearance: alert, cooperative and no distress Head: Normocephalic, without obvious abnormality, atraumatic Neck: no adenopathy Lymph nodes: Cervical, supraclavicular, and axillary nodes normal. Resp: clear to auscultation bilaterally Cardio: regular rate and rhythm, S1, S2 normal, no murmur, click, rub or gallop GI: soft, non-tender; bowel sounds normal; no masses,  no organomegaly Extremities: extremities normal, atraumatic, no cyanosis or edema  ECOG PERFORMANCE STATUS: 1 - Symptomatic but completely ambulatory  Blood pressure 141/58, pulse 77, resp. rate 18, height '5\' 11"'$  (1.803 m), SpO2 99 %.  LABORATORY DATA: Lab Results  Component Value Date   WBC 5.7 08/13/2015   HGB 14.0 08/13/2015   HCT 42.3 08/13/2015   MCV 90.2 08/13/2015   PLT 233 08/13/2015      Chemistry      Component Value Date/Time   NA 142 08/13/2015 0850   NA 138 09/24/2014 1246   K 3.6 08/13/2015 0850   K 4.4 09/24/2014 1246   CL 97 09/24/2014 1246   CL 98 08/11/2012 0807   CO2 30* 08/13/2015 0850   CO2 32 09/24/2014 1246   BUN 12.8 08/13/2015 0850   BUN 10 09/24/2014 1246   CREATININE 0.9 08/13/2015 0850   CREATININE 0.80 09/24/2014 1246      Component Value Date/Time   CALCIUM 10.0 08/13/2015 0850   CALCIUM 9.2 09/24/2014 1246   ALKPHOS 67 08/13/2015 0850   ALKPHOS 183* 09/24/2014 1246   AST 16 08/13/2015 0850   AST 18 09/24/2014 1246   ALT <9 08/13/2015 0850   ALT 14 09/24/2014 1246   BILITOT 1.14 08/13/2015 0850   BILITOT 0.6 09/24/2014 1246       RADIOGRAPHIC STUDIES: Ct Chest Wo Contrast  08/13/2015  CLINICAL DATA:  Status post left lower lobectomy for large cell neuroendocrine carcinoma on 10/10/2007. Remote history of colon cancer. Restaging. EXAM: CT CHEST WITHOUT CONTRAST TECHNIQUE: Multidetector CT imaging  of the chest was performed following the standard protocol without IV contrast. COMPARISON:  08/14/2014 chest CT. FINDINGS: Mediastinum/Nodes: Normal heart size. No pericardial fluid/thickening. Left anterior descending, left circumflex and right coronary atherosclerosis. Great vessels are normal in course and caliber. Thyroid appears atrophic or surgically absent. Normal esophagus. No axillary adenopathy. Surgical clips are noted in the paratracheal mediastinum. Coarse calcifications are seen within right hilar nodes from prior granulomatous disease. No pathologically enlarged mediastinal or gross hilar nodes. Lungs/Pleura: No pneumothorax. No pleural effusion. Status post left lower lobectomy. Stable coarsely calcified granuloma in anterior basilar right lower lobe. Mild centrilobular emphysema. No acute consolidative airspace disease, new significant pulmonary nodules or lung masses. Upper abdomen: Simple appearing 1.8 cm and 1.6 cm partially exophytic posterior left renal cysts. Musculoskeletal: No aggressive  appearing focal osseous lesions. Stable mild post thoracotomy changes in the left lateral fifth/ sixth ribs. Mild-to-moderate degenerative changes in the thoracic spine. IMPRESSION: 1. Status post left lower lobectomy, with no evidence of local tumor recurrence. 2. No evidence of metastatic disease in the chest. 3. Mild centrilobular emphysema and coronary atherosclerosis. Electronically Signed   By: Ilona Sorrel M.D.   On: 08/13/2015 11:47   ASSESSMENT AND PLAN: This is a very pleasant 81 years old white male with history of stage IIb non-small cell lung cancer status post left upper lobectomy followed by 4 cycles of adjuvant chemotherapy and he has been observation since May of 2009 with no evidence for disease recurrence. I discussed the scan results with the patient and his wife and daughter. I recommended for him to continue on observation for routine follow-up visit with his primary care physician.  I will discharge the patient from the clinic at this point and will be happy to see him in the future if needed. He was advised to call immediately if he has any concerning symptoms.  All questions were answered. The patient knows to call the clinic with any problems, questions or concerns. We can certainly see the patient much sooner if necessary.  Disclaimer: This note was dictated with voice recognition software. Similar sounding words can inadvertently be transcribed and may be missed upon review.

## 2015-10-16 ENCOUNTER — Ambulatory Visit: Payer: Medicare Other | Admitting: Neurology

## 2016-01-04 ENCOUNTER — Encounter (HOSPITAL_COMMUNITY): Payer: Self-pay | Admitting: Emergency Medicine

## 2016-01-04 ENCOUNTER — Emergency Department (HOSPITAL_COMMUNITY): Payer: Medicare Other

## 2016-01-04 ENCOUNTER — Emergency Department (HOSPITAL_COMMUNITY)
Admission: EM | Admit: 2016-01-04 | Discharge: 2016-01-04 | Disposition: A | Discharge status: Home / Self Care | Payer: Medicare Other | Attending: Emergency Medicine | Admitting: Emergency Medicine

## 2016-01-04 DIAGNOSIS — R140 Abdominal distension (gaseous): Secondary | ICD-10-CM | POA: Diagnosis not present

## 2016-01-04 DIAGNOSIS — Z7982 Long term (current) use of aspirin: Secondary | ICD-10-CM | POA: Insufficient documentation

## 2016-01-04 DIAGNOSIS — Z8781 Personal history of (healed) traumatic fracture: Secondary | ICD-10-CM | POA: Insufficient documentation

## 2016-01-04 DIAGNOSIS — R531 Weakness: Secondary | ICD-10-CM | POA: Diagnosis present

## 2016-01-04 DIAGNOSIS — Z85118 Personal history of other malignant neoplasm of bronchus and lung: Secondary | ICD-10-CM | POA: Diagnosis not present

## 2016-01-04 DIAGNOSIS — J449 Chronic obstructive pulmonary disease, unspecified: Secondary | ICD-10-CM | POA: Diagnosis not present

## 2016-01-04 DIAGNOSIS — E875 Hyperkalemia: Secondary | ICD-10-CM | POA: Diagnosis not present

## 2016-01-04 DIAGNOSIS — Z79899 Other long term (current) drug therapy: Secondary | ICD-10-CM | POA: Diagnosis not present

## 2016-01-04 DIAGNOSIS — Z85038 Personal history of other malignant neoplasm of large intestine: Secondary | ICD-10-CM | POA: Insufficient documentation

## 2016-01-04 DIAGNOSIS — I10 Essential (primary) hypertension: Secondary | ICD-10-CM | POA: Insufficient documentation

## 2016-01-04 DIAGNOSIS — E785 Hyperlipidemia, unspecified: Secondary | ICD-10-CM | POA: Diagnosis not present

## 2016-01-04 DIAGNOSIS — F0390 Unspecified dementia without behavioral disturbance: Secondary | ICD-10-CM | POA: Insufficient documentation

## 2016-01-04 LAB — COMPREHENSIVE METABOLIC PANEL WITH GFR
ALT: 25 U/L (ref 17–63)
AST: 71 U/L — ABNORMAL HIGH (ref 15–41)
Albumin: 3.3 g/dL — ABNORMAL LOW (ref 3.5–5.0)
Alkaline Phosphatase: 57 U/L (ref 38–126)
Anion gap: 12 (ref 5–15)
BUN: 10 mg/dL (ref 6–20)
CO2: 26 mmol/L (ref 22–32)
Calcium: 9.2 mg/dL (ref 8.9–10.3)
Chloride: 98 mmol/L — ABNORMAL LOW (ref 101–111)
Creatinine, Ser: 0.84 mg/dL (ref 0.61–1.24)
GFR calc Af Amer: >60 mL/min
GFR calc non Af Amer: >60 mL/min
Glucose, Bld: 96 mg/dL (ref 65–99)
Potassium: 5.8 mmol/L — ABNORMAL HIGH (ref 3.5–5.1)
Sodium: 136 mmol/L (ref 135–145)
Total Bilirubin: 2.6 mg/dL — ABNORMAL HIGH (ref 0.3–1.2)
Total Protein: 6 g/dL — ABNORMAL LOW (ref 6.5–8.1)

## 2016-01-04 LAB — URINALYSIS, ROUTINE W REFLEX MICROSCOPIC
Bilirubin Urine: NEGATIVE
Glucose, UA: NEGATIVE mg/dL
Hgb urine dipstick: NEGATIVE
Ketones, ur: NEGATIVE mg/dL
Leukocytes, UA: NEGATIVE
Nitrite: NEGATIVE
Protein, ur: NEGATIVE mg/dL
Specific Gravity, Urine: 1.017 (ref 1.005–1.030)
pH: 7 (ref 5.0–8.0)

## 2016-01-04 LAB — CBC WITH DIFFERENTIAL/PLATELET
BASOS ABS: 0 10*3/uL (ref 0.0–0.1)
BASOS PCT: 1 %
EOS PCT: 1 %
Eosinophils Absolute: 0.1 10*3/uL (ref 0.0–0.7)
HCT: 42.3 % (ref 39.0–52.0)
Hemoglobin: 13.2 g/dL (ref 13.0–17.0)
LYMPHS PCT: 16 %
Lymphs Abs: 1 10*3/uL (ref 0.7–4.0)
MCH: 28.4 pg (ref 26.0–34.0)
MCHC: 31.2 g/dL (ref 30.0–36.0)
MCV: 91.2 fL (ref 78.0–100.0)
MONO ABS: 0.5 10*3/uL (ref 0.1–1.0)
Monocytes Relative: 9 %
Neutro Abs: 4.4 10*3/uL (ref 1.7–7.7)
Neutrophils Relative %: 73 %
PLATELETS: 225 10*3/uL (ref 150–400)
RBC: 4.64 MIL/uL (ref 4.22–5.81)
RDW: 14.7 % (ref 11.5–15.5)
WBC: 6 10*3/uL (ref 4.0–10.5)

## 2016-01-04 LAB — LIPASE, BLOOD: Lipase: 31 U/L (ref 11–51)

## 2016-01-04 MED ORDER — DOCUSATE SODIUM 100 MG PO CAPS: 100.0000 mg | ORAL_CAPSULE | Freq: Two times a day (BID) | ORAL | Status: AC

## 2016-01-04 MED ORDER — DIATRIZOATE MEGLUMINE & SODIUM 66-10 % PO SOLN
ORAL | Status: AC
  Filled 2016-01-04: qty 30

## 2016-01-04 MED ORDER — IOPAMIDOL (ISOVUE-300) INJECTION 61%
INTRAVENOUS | Status: AC
  Administered 2016-01-04: 100 mL
  Filled 2016-01-04: qty 100

## 2016-01-04 MED ORDER — SODIUM CHLORIDE 0.9 % IV SOLN
INTRAVENOUS | Status: DC
  Administered 2016-01-04: 11:00:00 via INTRAVENOUS

## 2016-01-04 MED ORDER — SODIUM POLYSTYRENE SULFONATE 15 GM/60ML PO SUSP
30.0000 g | Freq: Once | ORAL | Status: AC
  Administered 2016-01-04: 30 g via ORAL
  Filled 2016-01-04: qty 120

## 2016-01-04 NOTE — ED Notes (Signed)
Pt. Coming from home via [facility] for increasing weakness in right arm and right-sided lean. Pt. Hx of stroke, per family, with residual right-sided deficits. Pt. Symptoms discovered 2000 last night. Pt. Hx of dementia, but at baseline per family. Pt. Family sts that pt. Has not been eating or drinking very well recently and also some diarrhea. EMS reports 12-lead unremarkable.

## 2016-01-04 NOTE — ED Notes (Signed)
Patient transported to X-ray 

## 2016-01-04 NOTE — ED Notes (Signed)
IV taken out.

## 2016-01-04 NOTE — ED Notes (Signed)
Lab called stating that patients CMP is hemolyzed and needs to be redrawn.

## 2016-01-04 NOTE — Discharge Instructions (Signed)
Follow-up with your doctor next week for a repeat potassium. Your value today was 5.8. And it needs to be repeated next week. Return here at once for fever, vomiting, worsening pain  Hyperkalemia Hyperkalemia is when you have too much potassium in your blood. Potassium is normally removed (excreted) from your body by your kidneys. If there is too much potassium in your blood, it can affect your heart's ability to function.  CAUSES  Hyperkalemia may be caused by:   Taking in too much potassium. You can do this by:  Using salt substitutes. They contain large amounts of potassium.  Taking potassium supplements.  Eating foods high in potassium.  Excreting too little potassium. This can happen if:  Your kidneys are not working properly. Kidney (renal) disease, including short- or long-term renal failure, is a very common cause of hyperkalemia.  You are taking medicines that lower your excretion of potassium.  You have Addison disease.  You have a urinary tract blockage, such as kidney stones.  You are on treatment to mechanically clean your blood (dialysis) and you skip a treatment.  Releasing a high amount of potassium from your cells into your blood. This can happen with:  Injury to muscles (rhabdomyolysis) or other tissues. Most potassium is stored in your muscles.  Severe burns or infections.  Acidic blood plasma (acidosis). Acidosis can result from many diseases, such as uncontrolled diabetes. RISK FACTORS The most common risk factor of hyperkalemia is kidney disease. Other risk factors of hyperkalemia include:  Addison disease. This is a condition where your glands do not produce enough hormones.  Alcoholism or heavy drug use.   Using certain blood pressure medicines, such as angiotensin-converting enzyme (ACE) inhibitors, angiotensin II receptor blockers (ARBs), or potassium-sparing diuretics such as spironolactone.  Severe injury or burn. SIGNS AND SYMPTOMS    Oftentimes, there are no signs or symptoms of hyperkalemia. However, when your potassium level becomes high enough, you may experience symptoms such as:  Irregular or very slow heartbeat.  Nausea.  Fatigue.  Tingling of the skin or numbness of the hands or feet.  Muscle weakness.  Fatigue.  Not being able to move (paralysis). You may not have any symptoms of hyperkalemia.  DIAGNOSIS  Hyperkalemia may be diagnosed by:  Physical exam.  Blood tests.  ECG (electrocardiogram).  Discussion of prescription and non-prescription drug use. TREATMENT  Treatment for hyperkalemia is often directed at the underlying cause. In some instances, treatment may include:   Insulin.  Glucose (sugar) and water solution given through a vein (intravenous or IV).  Dialysis.  Medicines to remove the potassium from your body.  Medicines to move calcium from your bloodstream into your tissues. HOME CARE INSTRUCTIONS   Take medicines only as directed by your health care provider.  Do not take any supplements, natural products, herbs, or vitamins without reviewing them with your health care provider. Certain supplements and natural food products can have high amounts of potassium.  Limit your alcohol intake as directed by your health care provider.  Stop illegal drug use. If you need help quitting, ask your health care provider.  Keep all follow-up visits as directed by your health care provider. This is important.  If you have kidney disease, you may need to follow a low potassium diet. A dietitian can help educate you on low potassium foods. SEEK MEDICAL CARE IF:   You notice an irregular or very slow heartbeat.  You feel light-headed.  You feel weak.  You are nauseous.  You have tingling or numbness in your hands or feet. SEEK IMMEDIATE MEDICAL CARE IF:   You have shortness of breath.  You have chest pain or discomfort.  You pass out.  You have muscle paralysis. MAKE  SURE YOU:   Understand these instructions.  Will watch your condition.  Will get help right away if you are not doing well or get worse.   This information is not intended to replace advice given to you by your health care provider. Make sure you discuss any questions you have with your health care provider.   Document Released: 08/14/2002 Document Revised: 09/14/2014 Document Reviewed: 11/29/2013 Elsevier Interactive Patient Education [employer].

## 2016-01-04 NOTE — ED Notes (Signed)
MD at bedside. 

## 2016-01-04 NOTE — ED Provider Notes (Signed)
CSN: [id number]     Arrival date & time 01/04/16  1018 History   First MD Initiated Contact with Patient 01/04/16 1023     Chief Complaint  Patient presents with  . Weakness     (Consider location/radiation/quality/duration/timing/severity/associated sxs/prior Treatment) HPI Comments: Patient here with right-sided weakness that occurred yesterday when he was trying to go to bed. Patient has history of dementia as well as prior CVA and does use a walker. He has baseline right-sided deficits which his family are unsure if they are unchanged from his baseline. He is also had abdominal distention without fever or vomiting. Does have a history of small bowel obstruction in the past. No recent urinary symptoms. No recent cough or congestion. Family states that the patient is at his baseline with respect to his mental status this morning. No treatment use prior to arrival  Patient is a 82 y.o. male presenting with weakness. The history is provided by the patient.  Weakness    Past Medical History  Diagnosis Date  . Hyperlipidemia   . Hypertension   . Allergy   . Fractured shoulder 8/30?    pt fell about 3 weeks ago and fractured shoulder blade   . Fracture, intertrochanteric, left femur ([place]) 08/17/2012  . COPD (chronic obstructive pulmonary disease) ([place])   . colon ca dx'd 1994    chemo  . Lung cancer ([place]) dx'd 07/2007    lu-lobectomy   Past Surgical History  Procedure Laterality Date  . Colon surgery    . Colostomy takedown    . Colostomy revision    . Hernia repair    . Femur fracture surgery  08/17/2012  . Intramedullary (im) nail intertrochanteric  08/17/2012    Procedure: INTRAMEDULLARY (IM) NAIL INTERTROCHANTRIC;  Surgeon: Johnny Bridge, MD;  Location: [facility];  Service: Orthopedics;  Laterality: Left;  Synthes GSN, Fracture Table, Big C arm   Family History  Problem Relation Age of Onset  . Stroke Mother   . Heart disease Brother    Social History  Substance Use  Topics  . Smoking status: Never Smoker   . Smokeless tobacco: Never Used  . Alcohol Use: No    Review of Systems  Neurological: Positive for weakness.  All other systems reviewed and are negative.     Allergies  Morphine and related and Codeine  Home Medications   Prior to Admission medications   Medication Sig Start Date End Date Taking? Authorizing Provider  acetaminophen (TYLENOL) 325 MG tablet Take 2 tablets (650 mg total) by mouth every 6 (six) hours as needed for pain. Patient not taking: Reported on 08/15/2015 08/17/12   Marchia Bond, MD  aspirin 81 MG tablet Take 81 mg by mouth daily.    Historical Provider, MD  atorvastatin (LIPITOR) 40 MG tablet Take 40 mg by mouth daily.      Historical Provider, MD  benztropine (COGENTIN) 1 MG tablet Take 1 mg by mouth at bedtime.    Historical Provider, MD  betamethasone valerate lotion (VALISONE) 0.1 % Apply 1 application topically as needed. 06/27/15   Historical Provider, MD  cholecalciferol (VITAMIN D) 1000 UNITS tablet Take 1,000 Units by mouth daily.    Historical Provider, MD  finasteride (PROSCAR) 5 MG tablet Take 5 mg by mouth daily. 06/27/15   Historical Provider, MD  hydrocortisone 2.5 % cream Apply 1 application topically 2 (two) times daily as needed (rash).  08/04/14   Historical Provider, MD  levothyroxine (SYNTHROID, LEVOTHROID) 112 MCG tablet Take  112 mcg by mouth daily.    Historical Provider, MD  magnesium oxide (MAG-OX) 400 MG tablet Take 400 mg by mouth 3 (three) times daily.     Historical Provider, MD  metoprolol succinate (TOPROL-XL) 25 MG 24 hr tablet Take 25 mg by mouth daily.  08/16/14   Historical Provider, MD  pantoprazole (PROTONIX) 40 MG tablet Take 40 mg by mouth daily.  08/23/14   Historical Provider, MD  potassium chloride 20 MEQ/15ML (10%) SOLN Take by mouth daily. 07/11/15   Historical Provider, MD  Probiotic Product (ALIGN PO) Take 1 tablet by mouth daily.    Historical Provider, MD   triamterene-hydrochlorothiazide (MAXZIDE-25) 37.5-25 MG per tablet Take 1 tablet by mouth every morning. 03/15/15   Historical Provider, MD   BP 121/58 mmHg  Pulse 72  Temp(Src) 97.9 F (36.6 C) (Oral)  Resp 18  SpO2 98% Physical Exam  Constitutional: He appears well-developed and well-nourished.  Non-toxic appearance. No distress.  HENT:  Head: Normocephalic and atraumatic.  Eyes: Conjunctivae, EOM and lids are normal. Pupils are equal, round, and reactive to light.  Neck: Normal range of motion. Neck supple. No tracheal deviation present. No thyroid mass present.  Cardiovascular: Normal rate, regular rhythm and normal heart sounds.  Exam reveals no gallop.   No murmur heard. Pulmonary/Chest: Effort normal and breath sounds normal. No stridor. No respiratory distress. He has no decreased breath sounds. He has no wheezes. He has no rhonchi. He has no rales.  Abdominal: Soft. Normal appearance and bowel sounds are normal. He exhibits no distension. There is no tenderness. There is no rebound and no CVA tenderness.  Musculoskeletal: Normal range of motion. He exhibits no edema or tenderness.  Neurological: He is alert. He displays no atrophy. No cranial nerve deficit. He exhibits normal muscle tone. GCS eye subscore is 4. GCS verbal subscore is 5. GCS motor subscore is 6.  Lower extremity strength is 2/5 bilateral and upper extremity strength is 4/5 bilateral. No facial symmetry. Speech is at baseline per family  Skin: Skin is warm and dry. No abrasion and no rash noted.  Psychiatric: He has a normal mood and affect. His behavior is normal. He is noncommunicative.  Nursing note and vitals reviewed.   ED Course  Procedures (including critical care time) Labs Review Labs Reviewed  URINE CULTURE  CBC WITH DIFFERENTIAL/PLATELET  COMPREHENSIVE METABOLIC PANEL  LIPASE, BLOOD  URINALYSIS, ROUTINE W REFLEX MICROSCOPIC (NOT AT [facility])    Imaging Review No results found. I have personally  reviewed and evaluated these images and lab results as part of my medical decision-making.   EKG Interpretation   Date/Time:  Saturday January 04 2016 10:19:43 EDT Ventricular Rate:  73 PR Interval:  235 QRS Duration: 99 QT Interval:  399 QTC Calculation: 440 R Axis:   -10 Text Interpretation:  Sinus rhythm Prolonged PR interval Consider left  atrial enlargement Abnormal R-wave progression, early transition Left  ventricular hypertrophy Borderline T abnormalities, inferior leads No  significant change since last tracing Confirmed by Sharika Mosquera  MD, Neithan Day  ([id number]) on 01/04/2016 10:41:38 AM      MDM   Final diagnoses:  None    Patient given Kayexalate for his mild hyperkalemia of 5.8. Patient does chronically take oral potassium and was told to stop this. Abdominal CT scan was reviewed with patient and his family. He has had no emesis here. No signs of obstruction at this time. Patient given prescription for stool softeners and told to follow-up with his  doctor next week to have a repeat potassium    Lacretia Leigh, MD 01/04/16 1606

## 2016-01-06 LAB — URINE CULTURE

## 2016-05-06 ENCOUNTER — Emergency Department (HOSPITAL_COMMUNITY): Payer: Medicare Other

## 2016-05-06 ENCOUNTER — Emergency Department (HOSPITAL_COMMUNITY)
Admission: EM | Admit: 2016-05-06 | Discharge: 2016-05-06 | Disposition: A | Discharge status: Home Health | Payer: Medicare Other | Attending: Emergency Medicine | Admitting: Emergency Medicine

## 2016-05-06 DIAGNOSIS — F0390 Unspecified dementia without behavioral disturbance: Secondary | ICD-10-CM | POA: Diagnosis not present

## 2016-05-06 DIAGNOSIS — Z7982 Long term (current) use of aspirin: Secondary | ICD-10-CM | POA: Insufficient documentation

## 2016-05-06 DIAGNOSIS — N390 Urinary tract infection, site not specified: Secondary | ICD-10-CM | POA: Insufficient documentation

## 2016-05-06 DIAGNOSIS — I10 Essential (primary) hypertension: Secondary | ICD-10-CM | POA: Diagnosis not present

## 2016-05-06 DIAGNOSIS — Z79899 Other long term (current) drug therapy: Secondary | ICD-10-CM | POA: Insufficient documentation

## 2016-05-06 DIAGNOSIS — E039 Hypothyroidism, unspecified: Secondary | ICD-10-CM | POA: Diagnosis not present

## 2016-05-06 DIAGNOSIS — J449 Chronic obstructive pulmonary disease, unspecified: Secondary | ICD-10-CM | POA: Diagnosis not present

## 2016-05-06 DIAGNOSIS — R531 Weakness: Secondary | ICD-10-CM | POA: Insufficient documentation

## 2016-05-06 DIAGNOSIS — Z85118 Personal history of other malignant neoplasm of bronchus and lung: Secondary | ICD-10-CM | POA: Insufficient documentation

## 2016-05-06 LAB — COMPREHENSIVE METABOLIC PANEL
ALT: 14 U/L — ABNORMAL LOW (ref 17–63)
ANION GAP: 7 (ref 5–15)
AST: 20 U/L (ref 15–41)
Albumin: 3.9 g/dL (ref 3.5–5.0)
Alkaline Phosphatase: 63 U/L (ref 38–126)
BUN: 17 mg/dL (ref 6–20)
CO2: 33 mmol/L — ABNORMAL HIGH (ref 22–32)
CREATININE: 1.03 mg/dL (ref 0.61–1.24)
Calcium: 9.9 mg/dL (ref 8.9–10.3)
Chloride: 100 mmol/L — ABNORMAL LOW (ref 101–111)
Glucose, Bld: 95 mg/dL (ref 65–99)
POTASSIUM: 3.5 mmol/L (ref 3.5–5.1)
SODIUM: 140 mmol/L (ref 135–145)
Total Bilirubin: 1.1 mg/dL (ref 0.3–1.2)
Total Protein: 6.8 g/dL (ref 6.5–8.1)

## 2016-05-06 LAB — CBC WITH DIFFERENTIAL/PLATELET
Basophils Absolute: 0 10*3/uL (ref 0.0–0.1)
Basophils Relative: 0 %
EOS ABS: 0.1 10*3/uL (ref 0.0–0.7)
EOS PCT: 1 %
HCT: 44 % (ref 39.0–52.0)
Hemoglobin: 14 g/dL (ref 13.0–17.0)
LYMPHS ABS: 1.4 10*3/uL (ref 0.7–4.0)
LYMPHS PCT: 23 %
MCH: 29.7 pg (ref 26.0–34.0)
MCHC: 31.8 g/dL (ref 30.0–36.0)
MCV: 93.4 fL (ref 78.0–100.0)
MONO ABS: 0.4 10*3/uL (ref 0.1–1.0)
Monocytes Relative: 7 %
Neutro Abs: 4.1 10*3/uL (ref 1.7–7.7)
Neutrophils Relative %: 69 %
PLATELETS: 215 10*3/uL (ref 150–400)
RBC: 4.71 MIL/uL (ref 4.22–5.81)
RDW: 14.3 % (ref 11.5–15.5)
WBC: 6 10*3/uL (ref 4.0–10.5)

## 2016-05-06 LAB — URINE MICROSCOPIC-ADD ON: RBC / HPF: NONE SEEN RBC/hpf (ref 0–5)

## 2016-05-06 LAB — URINALYSIS, ROUTINE W REFLEX MICROSCOPIC
Bilirubin Urine: NEGATIVE
Glucose, UA: NEGATIVE mg/dL
HGB URINE DIPSTICK: NEGATIVE
Ketones, ur: 15 mg/dL — AB
NITRITE: NEGATIVE
PROTEIN: NEGATIVE mg/dL
Specific Gravity, Urine: 1.02 (ref 1.005–1.030)
pH: 5.5 (ref 5.0–8.0)

## 2016-05-06 LAB — LACTIC ACID, PLASMA: Lactic Acid, Venous: 1.2 mmol/L (ref 0.5–1.9)

## 2016-05-06 MED ORDER — SODIUM CHLORIDE 0.9 % IV SOLN
INTRAVENOUS | Status: DC
  Administered 2016-05-06: 17:00:00 via INTRAVENOUS

## 2016-05-06 MED ORDER — CEPHALEXIN 250 MG/5ML PO SUSR: 500.0000 mg | Freq: Three times a day (TID) | ORAL | 0 refills | Status: AC

## 2016-05-06 MED ORDER — DEXTROSE 5 % IV SOLN
1.0000 g | Freq: Once | INTRAVENOUS | Status: AC
  Administered 2016-05-06: 1 g via INTRAVENOUS
  Filled 2016-05-06: qty 10

## 2016-05-06 MED ORDER — SODIUM CHLORIDE 0.9 % IV BOLUS (SEPSIS)
1000.0000 mL | Freq: Once | INTRAVENOUS | Status: AC
  Administered 2016-05-06: 1000 mL via INTRAVENOUS

## 2016-05-06 NOTE — ED Provider Notes (Signed)
Patient care assumed from Dr. Gilford Raid. UA suggests UTI. Given Rocephin. Discussed at length with family. They're quite anxious to be discharged. Arrangements have been made for delivery of hospital bed tomorrow via care manager. Family requests liquid antibiotic as he has "trouble swallowing pills". They also request EMS transport back to the home. Questions were answered. Given IV Rocephin. Urine culture added.   Tanna Furry, MD 05/06/16 [phone]

## 2016-05-06 NOTE — ED Provider Notes (Signed)
[facility] DEPT Provider Note   CSN: [id number] Arrival date & time: 05/06/16  1336     History   Chief Complaint Chief Complaint  Patient presents with  . Failure To Thrive  . Weakness    HPI Craig ODONELL is a 82 y.o. male.  Pt brought in by EMS due to weakness.  The pt has dementia and is a poor historian.  Family told EMS that he has not eaten today and was unable to walk.  Pt denies any pain.  Pt's wife said that normally walks with a walker and can stand up and help with bathroom activities.  This morning, he was unable to help.  He also was difficult to arouse.      Past Medical History:  Diagnosis Date  . Allergy   . colon ca dx'd 1994   chemo  . COPD (chronic obstructive pulmonary disease) ([place])   . Fracture, intertrochanteric, left femur ([place]) 08/17/2012  . Fractured shoulder 8/30?   pt fell about 3 weeks ago and fractured shoulder blade   . Hyperlipidemia   . Hypertension   . Lung cancer ([place]) dx'd 07/2007   lu-lobectomy    Patient Active Problem List   Diagnosis Date Noted  . Malignant neoplasm of upper lobe of left lung ([place]) 08/15/2015  . Fracture of multiple pubic rami ([place]) 09/02/2014  . 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 Essential hypertension 09/02/2014  . Hypothyroidism 09/02/2014  . Closed left subtrochanteric femur fracture ([place]) 08/17/2012  . Postop check 05/26/2011  . Postop check 04/28/2011  . Lung cancer ([place]) 09/22/2007  . HLD (hyperlipidemia) 08/30/2007  . HYPERTENSION 08/30/2007  . COPD 08/30/2007    Past Surgical History:  Procedure Laterality Date  . COLON SURGERY    . COLOSTOMY REVISION    . COLOSTOMY TAKEDOWN    . FEMUR FRACTURE SURGERY  08/17/2012  . HERNIA REPAIR    . INTRAMEDULLARY (IM) NAIL INTERTROCHANTERIC  08/17/2012   Procedure: INTRAMEDULLARY (IM) NAIL INTERTROCHANTRIC;  Surgeon: Johnny Bridge, MD;  Location: [facility];  Service: Orthopedics;  Laterality: Left;  Synthes GSN, Fracture  Table, Big C arm       Home Medications    Prior to Admission medications   Medication Sig Start Date End Date Taking? Authorizing Provider  acetaminophen (TYLENOL) 160 MG/5ML suspension Take 320 mg by mouth every 6 (six) hours as needed.   Yes Historical Provider, MD  acetaminophen (TYLENOL) 325 MG tablet Take 2 tablets (650 mg total) by mouth every 6 (six) hours as needed for pain. 08/17/12  Yes Marchia Bond, MD  aspirin 81 MG tablet Take 81 mg by mouth at bedtime.    Yes Historical Provider, MD  atorvastatin (LIPITOR) 80 MG tablet Take 40 mg by mouth every evening. 02/24/16  Yes Historical Provider, MD  cholecalciferol (VITAMIN D) 1000 UNITS tablet Take 1,000 Units by mouth every evening.    Yes Historical Provider, MD  dextromethorphan (DELSYM) 30 MG/5ML liquid Take 60 mg by mouth as needed for cough.   Yes Historical Provider, MD  docusate sodium (COLACE) 100 MG capsule Take 1 capsule (100 mg total) by mouth every 12 (twelve) hours. Patient taking differently: Take 100 mg by mouth daily.  01/04/16  Yes Lacretia Leigh, MD  finasteride (PROSCAR) 5 MG tablet Take 5 mg by mouth every morning.  06/27/15  Yes Historical Provider, MD  hydrocortisone 2.5 % cream Apply 1 application topically 2 (two) times daily as needed (rash).  08/04/14  Yes Historical Provider, MD  levothyroxine (SYNTHROID, LEVOTHROID) 112 MCG tablet Take 112 mcg by mouth daily.   Yes Historical Provider, MD  magnesium oxide (MAG-OX) 400 MG tablet Take 400 mg by mouth every evening.    Yes Historical Provider, MD  metoprolol succinate (TOPROL-XL) 25 MG 24 hr tablet Take 25 mg by mouth daily.  08/16/14  Yes Historical Provider, MD  potassium chloride 20 MEQ/15ML (10%) SOLN Take 20 mEq by mouth every morning. 04/30/16  Yes Historical Provider, MD  triamterene-hydrochlorothiazide (MAXZIDE-25) 37.5-25 MG per tablet Take 1 tablet by mouth every morning. 03/15/15  Yes Historical Provider, MD  pantoprazole (PROTONIX) 40 MG tablet Take  40 mg by mouth daily.  08/23/14   Historical Provider, MD    Family History Family History  Problem Relation Age of Onset  . Stroke Mother   . Heart disease Brother     Social History Social History  Substance Use Topics  . Smoking status: Never Smoker  . Smokeless tobacco: Never Used  . Alcohol use No     Allergies   Morphine and related; Codeine; and Fentanyl   Review of Systems Review of Systems  Unable to perform ROS: Dementia     Physical Exam Updated Vital Signs BP 141/79   Pulse 80   Temp 98.3 F (36.8 C) (Rectal)   Resp 14   Ht '6\' 2"'$  (1.88 m)   Wt 200 lb (90.7 kg)   SpO2 97%   BMI 25.68 kg/m   Physical Exam  Constitutional: He appears well-developed and well-nourished.  HENT:  Head: Normocephalic and atraumatic.  Right Ear: External ear normal.  Left Ear: External ear normal.  Nose: Nose normal.  Mouth/Throat: Oropharynx is clear and moist.  Eyes: Conjunctivae and EOM are normal. Pupils are equal, round, and reactive to light.  Neck: Normal range of motion. Neck supple.  Cardiovascular: Normal rate, regular rhythm, normal heart sounds and intact distal pulses.   Pulmonary/Chest: Effort normal and breath sounds normal.  Abdominal: Soft. Bowel sounds are normal.  Musculoskeletal: Normal range of motion.  Neurological: He is alert.  Pt is oriented to name and to place.  Skin: Skin is warm and dry.  Psychiatric: He has a normal mood and affect. His behavior is normal. Judgment and thought content normal.  Nursing note and vitals reviewed.    ED Treatments / Results  Labs (all labs ordered are listed, but only abnormal results are displayed) Labs Reviewed  COMPREHENSIVE METABOLIC PANEL - Abnormal; Notable for the following:       Result Value   Chloride 100 (*)    CO2 33 (*)    ALT 14 (*)    All other components within normal limits  CBC WITH DIFFERENTIAL/PLATELET  LACTIC ACID, PLASMA  URINALYSIS, ROUTINE W REFLEX MICROSCOPIC (NOT AT  [facility])    EKG  EKG Interpretation  Date/Time:  Wednesday May 06 2016 15:28:47 EDT Ventricular Rate:  69 PR Interval:    QRS Duration: 102 QT Interval:  418 QTC Calculation: 448 R Axis:   -6 Text Interpretation:  Sinus rhythm Prolonged PR interval Abnormal R-wave progression, early transition Borderline T abnormalities, inferior leads No significant change since last tracing Confirmed by [facility] MD, Amry Cathy ([id number]) on 05/06/2016 3:46:06 PM       Radiology Dg Chest 2 View  Result Date: 05/06/2016 CLINICAL DATA:  Altered mental status. EXAM: CHEST  2 VIEW COMPARISON:  Radiographs dated 01/04/2016 and CT scan of the chest dated 08/13/2015 and chest  x-ray dated 09/02/2014 and CT scan abdomen dated 01/04/2016 FINDINGS: There is chronic massive dilatation of the colon, unchanged since 2015. The heart size and pulmonary vascularity are normal. Calcification in the thoracic aorta. The lungs are clear. No effusions. No acute bone abnormality. IMPRESSION: No active cardiopulmonary disease. Aortic atherosclerosis. Chronic marked colonic gaseous distention. Electronically Signed   By: Lorriane Shire M.D.   On: 05/06/2016 14:57    Procedures Procedures (including critical care time)  Medications Ordered in ED Medications  sodium chloride 0.9 % bolus 1,000 mL (1,000 mLs Intravenous New Bag/Given 05/06/16 1514)    And  0.9 %  sodium chloride infusion (not administered)     Initial Impression / Assessment and Plan / ED Course  I have reviewed the triage vital signs and the nursing notes.  Pertinent labs & imaging results that were available during my care of the patient were reviewed by me and considered in my medical decision making (see chart for details).  Clinical Course   Pt's family said pt is back to baseline.  They requested a hospital bed, so I asked case management to consult on patient.  They are able to deliver the bed tomorrow.  Family happy with that plan.  Ct head and UA  pending.  Dr. Jeneen Rinks will take over care of patient.   Final Clinical Impressions(s) / ED Diagnoses   Final diagnoses:  Generalized weakness    New Prescriptions New Prescriptions   No medications on file     Isla Pence, MD 05/07/16 1712

## 2016-05-06 NOTE — ED Notes (Signed)
Pt dc home with family. Pt d.c via wheelchair and heavy assist.

## 2016-05-06 NOTE — Discharge Instructions (Signed)
You will be contacted prior to delivery of his hospital bed tomorrow.

## 2016-05-06 NOTE — Progress Notes (Signed)
ED CM met with patient and family to discuss recommendation for [facility] services.  Patient is alert and oriented to self, family is agreeable with transitional plan. According to family patient has not been eating or drinking for a couple of days.  ED eval revealed UTI, patient started on antibiotic. Discussed HH services and offered choices [facility] selected.Marland Kitchen Referral faxed in to [facility] 336 [phone] received fax confirmation. Family verbalized understanding teach back done. Provided family with [facility] contact information Explained that a Nurse from [facility] will contact them at the verified phone number 24-48 hours post discharge.  Denies any additional questions or concerns at this time. No further ED CM needs identified.

## 2016-05-06 NOTE — ED Notes (Signed)
Patient back from XR at this time

## 2016-05-06 NOTE — ED Triage Notes (Signed)
Pt in from home with family, brought via [facility] with reports of increased weakness/FTT. Pt has hx of alzheimer's, per family the pt is increasingly confused and weak today. Hasn't eaten since yesterday, but did take all meds. Has not been able to stand today, which he normally does. Pt is alert, oriented to self only.

## 2016-05-06 NOTE — Care Management Note (Signed)
Case Management Note  Patient Details  Name: [name] MRN: [id number] Date of Birth: [birth year]  Subjective/Objective:                  Pt in from home with family, brought via [facility] with reports of increased weakness/FTT. Pt has hx of alzheimer's, per family the pt is increasingly confused and weak today. Hasn't eaten since yesterday, but did take all meds. Has not been able to stand today, which he normally does.   Action/Plan: Follow for disposition needs. Richardean Canal for DME hospital bed.   Expected Discharge Date:                  Expected Discharge Plan:     In-House Referral:     Discharge planning Services  CM Consult  Post Acute Care Choice:    Choice offered to:     DME Arranged:  Hospital bed DME Agency:  [place]:    [facility] Agency:     Status of Service:  In process, will continue to follow  If discussed at Long Length of Stay Meetings, dates discussed:    Additional Comments: Fuller Mandril, RN, BSN, [place] [phone] Pt qualifies for DME hospital bed.  DME  ordered through [facility].  Melene Muller of [facility] notified to begin processing DME hospital bed.  Jermaine states that bed will be delivered to pt home and the family will be contacted prior to delivery of equipment.  Fuller Mandril, RN 05/06/2016, 4:01 PM

## 2016-06-17 ENCOUNTER — Emergency Department (HOSPITAL_COMMUNITY): Payer: Medicare Other

## 2016-06-17 ENCOUNTER — Encounter (HOSPITAL_COMMUNITY): Payer: Self-pay | Admitting: Emergency Medicine

## 2016-06-17 ENCOUNTER — Emergency Department (HOSPITAL_COMMUNITY)
Admission: EM | Admit: 2016-06-17 | Discharge: 2016-06-17 | Disposition: A | Discharge status: Home / Self Care | Payer: Medicare Other | Attending: Emergency Medicine | Admitting: Emergency Medicine

## 2016-06-17 DIAGNOSIS — E039 Hypothyroidism, unspecified: Secondary | ICD-10-CM | POA: Insufficient documentation

## 2016-06-17 DIAGNOSIS — R531 Weakness: Secondary | ICD-10-CM | POA: Diagnosis present

## 2016-06-17 DIAGNOSIS — Z7982 Long term (current) use of aspirin: Secondary | ICD-10-CM | POA: Insufficient documentation

## 2016-06-17 DIAGNOSIS — Z85118 Personal history of other malignant neoplasm of bronchus and lung: Secondary | ICD-10-CM | POA: Diagnosis not present

## 2016-06-17 DIAGNOSIS — F0390 Unspecified dementia without behavioral disturbance: Secondary | ICD-10-CM | POA: Insufficient documentation

## 2016-06-17 DIAGNOSIS — I10 Essential (primary) hypertension: Secondary | ICD-10-CM | POA: Diagnosis not present

## 2016-06-17 DIAGNOSIS — Z79899 Other long term (current) drug therapy: Secondary | ICD-10-CM | POA: Diagnosis not present

## 2016-06-17 DIAGNOSIS — J449 Chronic obstructive pulmonary disease, unspecified: Secondary | ICD-10-CM | POA: Insufficient documentation

## 2016-06-17 LAB — CBC WITH DIFFERENTIAL/PLATELET
BASOS ABS: 0 10*3/uL (ref 0.0–0.1)
BASOS PCT: 1 %
EOS ABS: 0.1 10*3/uL (ref 0.0–0.7)
EOS PCT: 1 %
HEMATOCRIT: 39.8 % (ref 39.0–52.0)
Hemoglobin: 12.7 g/dL — ABNORMAL LOW (ref 13.0–17.0)
Lymphocytes Relative: 20 %
Lymphs Abs: 1.2 10*3/uL (ref 0.7–4.0)
MCH: 29.5 pg (ref 26.0–34.0)
MCHC: 31.9 g/dL (ref 30.0–36.0)
MCV: 92.3 fL (ref 78.0–100.0)
MONO ABS: 0.5 10*3/uL (ref 0.1–1.0)
Monocytes Relative: 9 %
NEUTROS ABS: 4.1 10*3/uL (ref 1.7–7.7)
Neutrophils Relative %: 69 %
PLATELETS: 211 10*3/uL (ref 150–400)
RBC: 4.31 MIL/uL (ref 4.22–5.81)
RDW: 14.3 % (ref 11.5–15.5)
WBC: 5.9 10*3/uL (ref 4.0–10.5)

## 2016-06-17 LAB — URINALYSIS, ROUTINE W REFLEX MICROSCOPIC
Bilirubin Urine: NEGATIVE
GLUCOSE, UA: NEGATIVE mg/dL
HGB URINE DIPSTICK: NEGATIVE
Ketones, ur: NEGATIVE mg/dL
Leukocytes, UA: NEGATIVE
Nitrite: NEGATIVE
Protein, ur: NEGATIVE mg/dL
SPECIFIC GRAVITY, URINE: 1.019 (ref 1.005–1.030)
pH: 6 (ref 5.0–8.0)

## 2016-06-17 LAB — COMPREHENSIVE METABOLIC PANEL
ALK PHOS: 67 U/L (ref 38–126)
ALT: 18 U/L (ref 17–63)
AST: 25 U/L (ref 15–41)
Albumin: 3.6 g/dL (ref 3.5–5.0)
Anion gap: 9 (ref 5–15)
BILIRUBIN TOTAL: 0.7 mg/dL (ref 0.3–1.2)
BUN: 24 mg/dL — ABNORMAL HIGH (ref 6–20)
CALCIUM: 9.4 mg/dL (ref 8.9–10.3)
CO2: 27 mmol/L (ref 22–32)
Chloride: 104 mmol/L (ref 101–111)
Creatinine, Ser: 0.94 mg/dL (ref 0.61–1.24)
GFR calc Af Amer: >60 mL/min (ref >60)
GFR calc non Af Amer: >60 mL/min (ref >60)
GLUCOSE: 98 mg/dL (ref 65–99)
Potassium: 4 mmol/L (ref 3.5–5.1)
Sodium: 140 mmol/L (ref 135–145)
TOTAL PROTEIN: 6.8 g/dL (ref 6.5–8.1)

## 2016-06-17 LAB — I-STAT CG4 LACTIC ACID, ED: LACTIC ACID, VENOUS: 1.25 mmol/L (ref 0.5–1.9)

## 2016-06-17 LAB — CK: CK TOTAL: 24 U/L — AB (ref 49–397)

## 2016-06-17 MED ORDER — SODIUM CHLORIDE 0.9 % IV BOLUS (SEPSIS)
500.0000 mL | Freq: Once | INTRAVENOUS | Status: AC
  Administered 2016-06-17: 500 mL via INTRAVENOUS

## 2016-06-17 NOTE — ED Notes (Signed)
Bed: MQ59 Expected date:  Expected time:  Means of arrival:  Comments: EMS 82 yo M, sepsis

## 2016-06-17 NOTE — ED Triage Notes (Signed)
Pt from home, c/o dark brown, foul odor urine, weakness, loss of appetite, and fever x 2 weeks. Pt has been on an antibiotic for UTI. Pt A&O to baseline. Pt has hx of dementia. Denies pain. Family is on their way.

## 2016-06-17 NOTE — Progress Notes (Signed)
1240 paged Q Mock at [facility] to inquire about pt
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
## 2016-06-17 NOTE — ED Provider Notes (Signed)
[facility] DEPT Provider Note   CSN: [id number] Arrival date & time: 06/17/16  3875  LEVEL 5 CAVEAT - DEMENTIA   History   Chief Complaint Chief Complaint  Patient presents with  . Urinary Tract Infection  . Weakness    HPI ELAZAR ARGABRIGHT is a 82 y.o. male.  HPI  82 year old male presents from home with weakness and dark urine. The history is taken from the nurses spoke to EMS. Family is not currently present. The patient has a history of dementia and is usually alert and oriented to himself and this has not changed recently. However family has knows that he is now weaker than normal and unable to even assist standing up and getting into the wheelchair. He has had dark urine recently as well. Recently finished antibiotics for a UTI. Patient typically lays around a lot at home and wears diapers.   5:37 PM Family is here and more info is known. Wife and daughter state the patient has been progressively weaker and weaker for several weeks. He is barely able to help him self and family get him into a wheelchair. He has progressive dementia and seems to be more more confused although this is also been going on for about one month. Last took antibiotics for a UTI about 2 weeks ago. Family states his urine is always dark and this is not new. They have not known for him to have any fevers. He has a chronic cough but nothing is worse than typical today. Wife was very concerned because this morning his diaper was full of loose stool. This is the first time he has had diarrhea. He has chronically been having less and less fluid and food intake. He takes of boost every day but otherwise does not drink very much.  Past Medical History:  Diagnosis Date  . Allergy   . colon ca dx'd 1994   chemo  . COPD (chronic obstructive pulmonary disease) ([place])   . Fracture, intertrochanteric, left femur ([place]) 08/17/2012  . Fractured shoulder 8/30?   pt fell about 3 weeks ago and fractured shoulder blade     . Hyperlipidemia   . Hypertension   . Lung cancer ([place]) dx'd 07/2007   lu-lobectomy    Patient Active Problem List   Diagnosis Date Noted  . Malignant neoplasm of upper lobe of left lung ([place]) 08/15/2015  . Fracture of multiple pubic rami ([place]) 09/02/2014  . 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 Essential hypertension 09/02/2014  . Hypothyroidism 09/02/2014  . Closed left subtrochanteric femur fracture ([place]) 08/17/2012  . Postop check 05/26/2011  . Postop check 04/28/2011  . Lung cancer ([place]) 09/22/2007  . HLD (hyperlipidemia) 08/30/2007  . HYPERTENSION 08/30/2007  . COPD 08/30/2007    Past Surgical History:  Procedure Laterality Date  . COLON SURGERY    . COLOSTOMY REVISION    . COLOSTOMY TAKEDOWN    . FEMUR FRACTURE SURGERY  08/17/2012  . HERNIA REPAIR    . INTRAMEDULLARY (IM) NAIL INTERTROCHANTERIC  08/17/2012   Procedure: INTRAMEDULLARY (IM) NAIL INTERTROCHANTRIC;  Surgeon: Johnny Bridge, MD;  Location: [facility];  Service: Orthopedics;  Laterality: Left;  Synthes GSN, Fracture Table, Big C arm       Home Medications    Prior to Admission medications   Medication Sig Start Date End Date Taking? Authorizing Provider  acetaminophen (TYLENOL) 160 MG/5ML suspension Take 320 mg by mouth every 6 (six) hours as needed.   Yes Historical Provider,  MD  aspirin 81 MG tablet Take 81 mg by mouth at bedtime.    Yes Historical Provider, MD  atorvastatin (LIPITOR) 80 MG tablet Take 40 mg by mouth every evening. 02/24/16  Yes Historical Provider, MD  cholecalciferol (VITAMIN D) 1000 UNITS tablet Take 1,000 Units by mouth every evening.    Yes Historical Provider, MD  dextromethorphan (DELSYM) 30 MG/5ML liquid Take 60 mg by mouth 2 (two) times daily as needed for cough.    Yes Historical Provider, MD  finasteride (PROSCAR) 5 MG tablet Take 5 mg by mouth every morning.  06/27/15  Yes Historical Provider, MD  hydrocortisone 2.5 % cream Apply 1 application topically  2 (two) times daily as needed (rash).  08/04/14  Yes Historical Provider, MD  levothyroxine (SYNTHROID, LEVOTHROID) 112 MCG tablet Take 112 mcg by mouth daily.   Yes Historical Provider, MD  magnesium oxide (MAG-OX) 400 MG tablet Take 400 mg by mouth every evening.    Yes Historical Provider, MD  metoprolol tartrate (LOPRESSOR) 25 MG tablet Take 12.5 mg by mouth 2 (two) times daily. Crush and take with applesauce 06/10/16  Yes Historical Provider, MD  potassium chloride 20 MEQ/15ML (10%) SOLN Take 20 mEq by mouth every morning. 04/30/16  Yes Historical Provider, MD  triamterene-hydrochlorothiazide (MAXZIDE-25) 37.5-25 MG per tablet Take 1 tablet by mouth every morning. 03/15/15  Yes Historical Provider, MD  acetaminophen (TYLENOL) 325 MG tablet Take 2 tablets (650 mg total) by mouth every 6 (six) hours as needed for pain. Patient not taking: Reported on 06/17/2016 08/17/12   Marchia Bond, MD  docusate sodium (COLACE) 100 MG capsule Take 1 capsule (100 mg total) by mouth every 12 (twelve) hours. Patient not taking: Reported on 06/17/2016 01/04/16   Lacretia Leigh, MD  pantoprazole (PROTONIX) 40 MG tablet Take 40 mg by mouth daily.  08/23/14   Historical Provider, MD    Family History Family History  Problem Relation Age of Onset  . Stroke Mother   . Heart disease Brother     Social History Social History  Substance Use Topics  . Smoking status: Never Smoker  . Smokeless tobacco: Never Used  . Alcohol use No     Allergies   Morphine and related; Codeine; and Fentanyl   Review of Systems Review of Systems  Unable to perform ROS: Dementia     Physical Exam Updated Vital Signs BP 132/75   Pulse 79   Temp 97.9 F (36.6 C) (Rectal)   Resp 20   SpO2 96%   Physical Exam  Constitutional: He appears well-developed and well-nourished.  HENT:  Head: Normocephalic and atraumatic.  Right Ear: External ear normal.  Left Ear: External ear normal.  Nose: Nose normal.  Mouth/Throat:  Mucous membranes are dry.  Eyes: Right eye exhibits no discharge. Left eye exhibits no discharge.  Neck: Neck supple.  Cardiovascular: Normal rate, regular rhythm and normal heart sounds.   Pulmonary/Chest: Effort normal and breath sounds normal.  Abdominal: Soft. There is no tenderness.  Musculoskeletal: He exhibits no edema.  Neurological: He is alert. He is disoriented.  Alert, oriented to self only. 5/5 strength in BUE. Will not lift lower extremities off bed when asked, only says "no". When I lift them off the bed they fall back down.  Skin: Skin is warm and dry.  Nursing note and vitals reviewed.    ED Treatments / Results  Labs (all labs ordered are listed, but only abnormal results are displayed) Labs Reviewed  COMPREHENSIVE METABOLIC PANEL -  Abnormal; Notable for the following:       Result Value   BUN 24 (*)    All other components within normal limits  CBC WITH DIFFERENTIAL/PLATELET - Abnormal; Notable for the following:    Hemoglobin 12.7 (*)    All other components within normal limits  CK - Abnormal; Notable for the following:    Total CK 24 (*)    All other components within normal limits  URINE CULTURE  URINALYSIS, ROUTINE W REFLEX MICROSCOPIC (NOT AT [facility])  I-STAT CG4 LACTIC ACID, ED    EKG  EKG Interpretation  Date/Time:  Wednesday June 17 2016 10:34:31 EDT Ventricular Rate:  73 PR Interval:    QRS Duration: 99 QT Interval:  420 QTC Calculation: 463 R Axis:   26 Text Interpretation:  Sinus rhythm Ventricular premature complex Borderline low voltage, extremity leads Abnormal R-wave progression, early transition no significant change since May 06 2016 Confirmed by Regenia Skeeter MD, Dejuana Weist [phone]) on 06/17/2016 10:47:55 AM       Radiology Dg Chest 2 View  Result Date: 06/17/2016 CLINICAL DATA:  Increased weakness, loss of appetite, fever over last 2 weeks, worse dementia over last few days per family, recent uti EXAM: CHEST  2 VIEW COMPARISON:   05/06/2016 FINDINGS: Cardiac silhouette is normal in size. No mediastinal or hilar masses or convincing adenopathy. Surgical vascular clips project adjacent to the carina and along the right hilum. These are stable. Lungs are clear.  No pleural effusion.  No pneumothorax. Lung volumes are relatively low. Hemidiaphragms are elevated by a distended colon, similar to the prior exam. IMPRESSION: 1. No acute cardiopulmonary disease. 2. Distended colon consistent with an adynamic ileus, similar to the prior chest radiographs. Electronically Signed   By: Lajean Manes M.D.   On: 06/17/2016 11:20   Ct Head Wo Contrast  Result Date: 06/17/2016 CLINICAL DATA:  82 year old male with altered mental status, weakness loss of appetite and fever for 2 weeks. Initial encounter. EXAM: CT HEAD WITHOUT CONTRAST TECHNIQUE: Contiguous axial images were obtained from the base of the skull through the vertex without intravenous contrast. COMPARISON:  Head CT without contrast 05/06/2016 and earlier. FINDINGS: Brain: Stable cerebral volume. No midline shift, ventriculomegaly, mass effect, evidence of mass lesion, intracranial hemorrhage or evidence of cortically based acute infarction. Gray-white matter differentiation is within normal limits throughout the brain. No cortical encephalomalacia identified. Vascular: Calcified atherosclerosis at the skull base. No suspicious intracranial vascular hyperdensity. Skull: No acute osseous abnormality identified. Sinuses/Orbits: Visualized paranasal sinuses and mastoids are stable and well pneumatized. Other: No acute orbit or scalp soft tissue findings. IMPRESSION: Stable and negative for age non contrast CT appearance of the brain. Electronically Signed   By: Genevie Ann M.D.   On: 06/17/2016 11:14    Procedures Procedures (including critical care time)  Medications Ordered in ED Medications  sodium chloride 0.9 % bolus 500 mL (0 mLs Intravenous Stopped 06/17/16 1322)     Initial  Impression / Assessment and Plan / ED Course  I have reviewed the triage vital signs and the nursing notes.  Pertinent labs & imaging results that were available during my care of the patient were reviewed by me and considered in my medical decision making (see chart for details).  Clinical Course  Comment By Time  Family is not currently present. Will check rectal temp, urine, and labs. Sherwood Gambler, MD 10/11 [phone]  Daughter feels like wife needs help at home. Has a prn person to help but only  calls when needed but probably needs full time or daily help. Will consult sw/cm Sherwood Gambler, MD 10/11 1018  Maudie Mercury, [place], says hospice will come see patient within an hour. Otherwise appears medically stable, will d/c after that consult. Sherwood Gambler, MD 10/11 1203    Palliative care has consulted with patient and patient will be discharged home with PCP follow-up next week which he already has as well as outpatient hospice follow up. Wife is not interested in patient going into a NH at this time. I think his subacute decline is not infectious but more likely related to progressing dementia.  Final Clinical Impressions(s) / ED Diagnoses   Final diagnoses:  Dementia without behavioral disturbance, unspecified dementia type  Generalized weakness    New Prescriptions Discharge Medication List as of 06/17/2016  1:05 PM       Sherwood Gambler, MD 06/17/16 1739

## 2016-06-17 NOTE — Progress Notes (Signed)
[facility] Liaison Note  Received request from Maudie Mercury [facility] for family interest in hospice services with [facility].  Met with wife, Barbaraann Share and daughters at bedside to discuss services and offer information.  Family verbalized understanding.  Patient is scheduled an office visit with Dr. Sandi Mariscal next Friday, where they plan to further discuss hospice services in the home.  Referral made to [facility] referral center on behalf of the family, per family request.  Left pamphlet and contact information if any questions arise.  Thank you, Freddi Starr RN, [facility] Liaison [phone]

## 2016-06-18 LAB — URINE CULTURE: Culture: NO GROWTH

## 2016-08-07 DEATH — deceased
